# Patient Record
Sex: Female | Born: 1950 | Race: Black or African American | Hispanic: No | Smoking: Current every day smoker
Health system: Southern US, Community
[De-identification: ages and names within clinical notes are randomized; demographics above are authoritative.]

## PROBLEM LIST (undated history)

## (undated) DIAGNOSIS — R5383 Other fatigue: Secondary | ICD-10-CM

## (undated) DIAGNOSIS — R0683 Snoring: Secondary | ICD-10-CM

## (undated) DIAGNOSIS — E785 Hyperlipidemia, unspecified: Secondary | ICD-10-CM

## (undated) DIAGNOSIS — K219 Gastro-esophageal reflux disease without esophagitis: Secondary | ICD-10-CM

## (undated) DIAGNOSIS — I10 Essential (primary) hypertension: Secondary | ICD-10-CM

## (undated) DIAGNOSIS — G4700 Insomnia, unspecified: Secondary | ICD-10-CM

## (undated) DIAGNOSIS — F419 Anxiety disorder, unspecified: Secondary | ICD-10-CM

## (undated) DIAGNOSIS — F329 Major depressive disorder, single episode, unspecified: Secondary | ICD-10-CM

## (undated) DIAGNOSIS — F17200 Nicotine dependence, unspecified, uncomplicated: Secondary | ICD-10-CM

## (undated) DIAGNOSIS — E7800 Pure hypercholesterolemia, unspecified: Secondary | ICD-10-CM

## (undated) HISTORY — DX: Gastro-esophageal reflux disease without esophagitis: K21.9

## (undated) HISTORY — PX: TUBAL LIGATION: SHX77

## (undated) HISTORY — DX: Anxiety disorder, unspecified: F41.9

## (undated) HISTORY — DX: Essential (primary) hypertension: I10

## (undated) HISTORY — DX: Hyperlipidemia, unspecified: E78.5

## (undated) HISTORY — DX: Major depressive disorder, single episode, unspecified: F32.9

## (undated) HISTORY — DX: Other fatigue: R53.83

## (undated) HISTORY — DX: Snoring: R06.83

## (undated) HISTORY — DX: Nicotine dependence, unspecified, uncomplicated: F17.200

## (undated) HISTORY — DX: Insomnia, unspecified: G47.00

---

## 2012-11-17 ENCOUNTER — Encounter (HOSPITAL_COMMUNITY): Payer: Self-pay | Admitting: *Deleted

## 2012-11-17 ENCOUNTER — Emergency Department (HOSPITAL_COMMUNITY): Payer: Self-pay

## 2012-11-17 ENCOUNTER — Emergency Department (HOSPITAL_COMMUNITY)
Admission: EM | Admit: 2012-11-17 | Discharge: 2012-11-17 | Disposition: A | Discharge status: Home / Self Care | Payer: Self-pay | Attending: Emergency Medicine | Admitting: Emergency Medicine

## 2012-11-17 DIAGNOSIS — F17200 Nicotine dependence, unspecified, uncomplicated: Secondary | ICD-10-CM | POA: Insufficient documentation

## 2012-11-17 DIAGNOSIS — I10 Essential (primary) hypertension: Secondary | ICD-10-CM | POA: Insufficient documentation

## 2012-11-17 DIAGNOSIS — R5381 Other malaise: Secondary | ICD-10-CM | POA: Insufficient documentation

## 2012-11-17 DIAGNOSIS — R509 Fever, unspecified: Secondary | ICD-10-CM | POA: Insufficient documentation

## 2012-11-17 DIAGNOSIS — R1012 Left upper quadrant pain: Secondary | ICD-10-CM | POA: Insufficient documentation

## 2012-11-17 DIAGNOSIS — J189 Pneumonia, unspecified organism: Secondary | ICD-10-CM | POA: Insufficient documentation

## 2012-11-17 DIAGNOSIS — N390 Urinary tract infection, site not specified: Secondary | ICD-10-CM | POA: Insufficient documentation

## 2012-11-17 HISTORY — DX: Essential (primary) hypertension: I10

## 2012-11-17 LAB — CBC WITH DIFFERENTIAL/PLATELET
Basophils Absolute: 0.1 10*3/uL (ref 0.0–0.1)
Basophils Relative: 1 % (ref 0–1)
Eosinophils Absolute: 0.1 10*3/uL (ref 0.0–0.7)
Eosinophils Relative: 1 % (ref 0–5)
MCH: 30.3 pg (ref 26.0–34.0)
MCV: 86.9 fL (ref 78.0–100.0)
Platelets: 268 10*3/uL (ref 150–400)
RDW: 13.3 % (ref 11.5–15.5)
WBC: 8.5 10*3/uL (ref 4.0–10.5)

## 2012-11-17 LAB — URINALYSIS, ROUTINE W REFLEX MICROSCOPIC
Bilirubin Urine: NEGATIVE
Ketones, ur: NEGATIVE mg/dL
Nitrite: POSITIVE — AB
Urobilinogen, UA: 1 mg/dL (ref 0.0–1.0)

## 2012-11-17 LAB — BASIC METABOLIC PANEL
CO2: 27 mEq/L (ref 19–32)
Calcium: 9.4 mg/dL (ref 8.4–10.5)
Creatinine, Ser: 0.64 mg/dL (ref 0.50–1.10)
Glucose, Bld: 107 mg/dL — ABNORMAL HIGH (ref 70–99)

## 2012-11-17 MED ORDER — SODIUM CHLORIDE 0.9 % IV BOLUS (SEPSIS)
500.0000 mL | Freq: Once | INTRAVENOUS | Status: AC
  Administered 2012-11-17: 500 mL via INTRAVENOUS

## 2012-11-17 MED ORDER — POTASSIUM CHLORIDE CRYS ER 20 MEQ PO TBCR
40.0000 meq | EXTENDED_RELEASE_TABLET | Freq: Once | ORAL | Status: AC
  Administered 2012-11-17: 40 meq via ORAL
  Filled 2012-11-17: qty 2

## 2012-11-17 MED ORDER — SODIUM CHLORIDE 0.9 % IV SOLN: Freq: Once | INTRAVENOUS | Status: DC

## 2012-11-17 MED ORDER — OXYCODONE-ACETAMINOPHEN 5-325 MG PO TABS
1.0000 | ORAL_TABLET | Freq: Once | ORAL | Status: AC
  Administered 2012-11-17: 1 via ORAL
  Filled 2012-11-17: qty 1

## 2012-11-17 MED ORDER — AZITHROMYCIN 250 MG PO TABS: ORAL_TABLET | ORAL | Status: DC

## 2012-11-17 MED ORDER — AZITHROMYCIN 250 MG PO TABS
500.0000 mg | ORAL_TABLET | Freq: Once | ORAL | Status: AC
  Administered 2012-11-17: 500 mg via ORAL
  Filled 2012-11-17: qty 2

## 2012-11-17 MED ORDER — CEPHALEXIN 500 MG PO CAPS: 500.0000 mg | ORAL_CAPSULE | Freq: Three times a day (TID) | ORAL | Status: DC

## 2012-11-17 MED ORDER — OXYCODONE-ACETAMINOPHEN 5-325 MG PO TABS: 1.0000 | ORAL_TABLET | ORAL | Status: DC | PRN

## 2012-11-17 MED ORDER — CEFTRIAXONE SODIUM 1 G IJ SOLR
1.0000 g | Freq: Once | INTRAMUSCULAR | Status: AC
  Administered 2012-11-17: 1 g via INTRAVENOUS
  Filled 2012-11-17: qty 10

## 2012-11-17 NOTE — ED Notes (Signed)
Dr. Bebe Shaggy notified of K+ 2.9.

## 2012-11-17 NOTE — ED Notes (Signed)
Pt states she has been productive cough x ~ 1 week, yellow in color. LUQ/rib area pain x 3 days which pt states is much worse with coughing. Pt states she has chills and had V/D last week.

## 2012-11-17 NOTE — ED Notes (Signed)
Patient has had cough for greater than 1 week w/development of L lower rib cage pain w/coughing.  N/V/D last week, but none over weekend.  According to daughter, patient has lost great deal of weight since death of mother in Apr 10, 2011.  Patient states she has no appetite and get full very quickly.  She has not been taken fluids well over the last week. She does express having some depression since 10-Apr-2011.

## 2012-11-17 NOTE — ED Provider Notes (Signed)
History  This chart was scribed for Joya Gaskins, MD by Ardeen Jourdain, ED Scribe. This patient was seen in room [facility]12/[facility]12 and the patient's care was started at 1114.  CSN: [id number]  Arrival date & time 11/17/12  1023   First MD Initiated Contact with Patient 11/17/12 1114      Chief Complaint  Patient presents with  . Cough  . Abdominal Pain     Patient is a 62 y.o. female presenting with cough and abdominal pain. The history is provided by the patient. No language interpreter was used.  Cough This is a new problem. The current episode started more than 2 days ago. The problem has been gradually worsening. The cough is productive of sputum. There has been no fever.  Abdominal Pain The primary symptoms of the illness include abdominal pain, fever and fatigue. The primary symptoms of the illness do not include nausea, vomiting or diarrhea. The current episode started more than 2 days ago.  The abdominal pain began more than 2 days ago. The pain came on gradually. The abdominal pain has been unchanged since its onset. The abdominal pain is located in the LUQ. The abdominal pain does not radiate. The abdominal pain is exacerbated by coughing.    Tamara Santiago is a 62 y.o. female who presents to the Emergency Department complaining of cough with associated fever, loss of appetite, HA, fatigue and LUQ/rib pain. She states she had emesis and diarrhea 1 week ago but that it has resolved. She states she is a smoker but has not been able to smoke due to her cough. She states she has lost 15 pounds in the past 2 weeks.    Past Medical History  Diagnosis Date  . Hypertension     Past Surgical History  Procedure Date  . Tubal ligation     No family history on file.  History  Substance Use Topics  . Smoking status: Current Every Day Smoker  . Smokeless tobacco: Not on file  . Alcohol Use: No   No OB history available.   Review of Systems  Constitutional: Positive for  fever, fatigue and unexpected weight change.  Respiratory: Positive for cough.   Gastrointestinal: Positive for abdominal pain. Negative for nausea, vomiting and diarrhea.  All other systems reviewed and are negative.    Allergies  Review of patient's allergies indicates no known allergies.  Home Medications  No current outpatient prescriptions on file.  Triage Vitals: BP 138/87  Pulse 85  Temp 98.1 F (36.7 C) (Oral)  Resp 20  Ht 5\' 5"  (1.651 m)  Wt 122 lb 3 oz (55.424 kg)  BMI 20.33 kg/m2  SpO2 100% BP 134/58  Pulse 61  Temp 98.7 F (37.1 C) (Oral)  Resp 18  Ht 5\' 5"  (1.651 m)  Wt 122 lb 3 oz (55.424 kg)  BMI 20.33 kg/m2  SpO2 98%  Physical Exam  CONSTITUTIONAL: Well developed/well nourished HEAD AND FACE: Normocephalic/atraumatic EYES: EOMI/PERRL ENMT: Mucous membranes moist NECK: supple no meningeal signs SPINE:entire spine nontender CV: S1/S2 noted, no murmurs/rubs/gallops noted Chest wall: Tenderness to left lower ribs, no crepitance  LUNGS: Crackles to left base, no apparent distress ABDOMEN: soft, nontender, no rebound or guarding GU:no cva tenderness NEURO: Pt is awake/alert, moves all extremitiesx4 EXTREMITIES: pulses normal, full ROM SKIN: warm, color normal PSYCH: no abnormalities of mood noted   ED Course  Procedures   DIAGNOSTIC STUDIES: Oxygen Saturation is 100% on room air, normal  by my interpretation.  COORDINATION OF CARE:  11:45 AM: Discussed treatment plan which includes CXR, IV fluids, UA and CBC with pt at bedside and pt agreed to plan.   Pt improved.  Ambulatory and vitals appropriate.  She is now living in this area and referred to local PCP.  She has ?pneumonia and has uti.  She will be treated for both.  She does not appear septic.  Advised to f/u for unintentional wt loss.  Pt agreeable.  She will stop smoking.      MDM  Nursing notes including past medical history and social history reviewed and considered in  documentation xrays reviewed and considered Labs/vital reviewed and considered      Date: 11/17/2012  Rate: 64  Rhythm: normal sinus rhythm  QRS Axis: normal  Intervals: normal  ST/T Wave abnormalities: normal  Conduction Disutrbances:none  Narrative Interpretation:   Old EKG Reviewed: none available at time of interpretation    I personally performed the services described in this documentation, which was scribed in my presence. The recorded information has been reviewed and is accurate.      Joya Gaskins, MD 11/17/12 [phone]

## 2012-11-17 NOTE — ED Notes (Signed)
Patient with no complaints at this time. Respirations even and unlabored. Skin warm/dry. Discharge instructions reviewed with patient at this time. Patient given opportunity to voice concerns/ask questions. IV removed per policy and band-aid applied to site. Patient discharged at this time and left Emergency Department with steady gait.  

## 2012-11-17 NOTE — ED Notes (Signed)
EKG delay due to patient in x-ray.

## 2012-11-17 NOTE — ED Notes (Signed)
Patient stated that she "felt shaky" while walking and also that she was very weak.  Patient did ambulate without assistance

## 2012-11-19 LAB — URINE CULTURE: Colony Count: >=100000

## 2012-11-20 NOTE — ED Notes (Signed)
+   Urine Patient treated with keflex-sensitive to same-chart appended per protocol MD. 

## 2016-10-02 ENCOUNTER — Other Ambulatory Visit (HOSPITAL_COMMUNITY): Payer: Self-pay | Admitting: Internal Medicine

## 2016-10-02 DIAGNOSIS — Z1231 Encounter for screening mammogram for malignant neoplasm of breast: Secondary | ICD-10-CM

## 2016-10-10 ENCOUNTER — Other Ambulatory Visit (HOSPITAL_COMMUNITY): Payer: Self-pay | Admitting: Internal Medicine

## 2016-10-10 DIAGNOSIS — E559 Vitamin D deficiency, unspecified: Secondary | ICD-10-CM

## 2016-10-10 DIAGNOSIS — Z780 Asymptomatic menopausal state: Secondary | ICD-10-CM

## 2016-10-15 ENCOUNTER — Other Ambulatory Visit (HOSPITAL_COMMUNITY): Payer: Self-pay

## 2016-10-15 ENCOUNTER — Ambulatory Visit (HOSPITAL_COMMUNITY): Payer: Self-pay

## 2016-10-22 ENCOUNTER — Other Ambulatory Visit (HOSPITAL_COMMUNITY): Payer: Self-pay

## 2016-10-24 ENCOUNTER — Ambulatory Visit (HOSPITAL_COMMUNITY)
Admission: RE | Admit: 2016-10-24 | Discharge: 2016-10-24 | Disposition: A | Discharge status: Home / Self Care | Payer: Medicare Other | Source: Ambulatory Visit | Attending: Internal Medicine | Admitting: Internal Medicine

## 2016-10-24 DIAGNOSIS — Z1231 Encounter for screening mammogram for malignant neoplasm of breast: Secondary | ICD-10-CM | POA: Diagnosis present

## 2016-10-24 DIAGNOSIS — M85832 Other specified disorders of bone density and structure, left forearm: Secondary | ICD-10-CM | POA: Diagnosis not present

## 2016-10-24 DIAGNOSIS — Z780 Asymptomatic menopausal state: Secondary | ICD-10-CM

## 2016-10-24 DIAGNOSIS — E559 Vitamin D deficiency, unspecified: Secondary | ICD-10-CM | POA: Diagnosis not present

## 2016-10-24 DIAGNOSIS — R928 Other abnormal and inconclusive findings on diagnostic imaging of breast: Secondary | ICD-10-CM | POA: Diagnosis not present

## 2016-10-24 DIAGNOSIS — M85852 Other specified disorders of bone density and structure, left thigh: Secondary | ICD-10-CM | POA: Diagnosis not present

## 2016-10-30 ENCOUNTER — Other Ambulatory Visit: Payer: Self-pay | Admitting: Internal Medicine

## 2016-10-30 DIAGNOSIS — R928 Other abnormal and inconclusive findings on diagnostic imaging of breast: Secondary | ICD-10-CM

## 2016-10-31 ENCOUNTER — Institutional Professional Consult (permissible substitution): Payer: Medicare Other | Admitting: Neurology

## 2016-10-31 ENCOUNTER — Telehealth: Payer: Self-pay

## 2016-10-31 NOTE — Telephone Encounter (Signed)
Pt did not show for their appt with Dr. Dohmeier today.  

## 2016-11-01 ENCOUNTER — Encounter: Payer: Self-pay | Admitting: Neurology

## 2016-11-02 ENCOUNTER — Other Ambulatory Visit (HOSPITAL_COMMUNITY): Payer: Self-pay | Admitting: Internal Medicine

## 2016-11-02 DIAGNOSIS — R928 Other abnormal and inconclusive findings on diagnostic imaging of breast: Secondary | ICD-10-CM

## 2016-11-06 ENCOUNTER — Ambulatory Visit (HOSPITAL_COMMUNITY)
Admission: RE | Admit: 2016-11-06 | Discharge: 2016-11-06 | Disposition: A | Discharge status: Home / Self Care | Payer: Medicare Other | Source: Ambulatory Visit | Attending: Internal Medicine | Admitting: Internal Medicine

## 2016-11-06 ENCOUNTER — Other Ambulatory Visit (HOSPITAL_COMMUNITY): Payer: Self-pay | Admitting: Internal Medicine

## 2016-11-06 DIAGNOSIS — R928 Other abnormal and inconclusive findings on diagnostic imaging of breast: Secondary | ICD-10-CM

## 2016-11-06 DIAGNOSIS — N6310 Unspecified lump in the right breast, unspecified quadrant: Secondary | ICD-10-CM | POA: Insufficient documentation

## 2016-11-06 DIAGNOSIS — N6320 Unspecified lump in the left breast, unspecified quadrant: Secondary | ICD-10-CM | POA: Insufficient documentation

## 2016-11-07 ENCOUNTER — Other Ambulatory Visit (HOSPITAL_COMMUNITY): Payer: Self-pay | Admitting: Internal Medicine

## 2016-11-07 DIAGNOSIS — N6310 Unspecified lump in the right breast, unspecified quadrant: Secondary | ICD-10-CM

## 2016-11-07 DIAGNOSIS — N6320 Unspecified lump in the left breast, unspecified quadrant: Secondary | ICD-10-CM

## 2016-11-19 ENCOUNTER — Encounter: Payer: Self-pay | Admitting: Adult Health

## 2016-11-20 ENCOUNTER — Inpatient Hospital Stay (HOSPITAL_COMMUNITY): Admission: RE | Admit: 2016-11-20 | Payer: Medicare Other | Source: Ambulatory Visit

## 2016-11-27 ENCOUNTER — Telehealth: Payer: Self-pay

## 2016-11-27 ENCOUNTER — Institutional Professional Consult (permissible substitution): Payer: Medicare Other | Admitting: Neurology

## 2016-11-27 NOTE — Telephone Encounter (Signed)
Please follow office protocol for no show x 2, okay to dismiss.

## 2016-11-27 NOTE — Telephone Encounter (Signed)
Patient no showed her 2nd new sleep consult appt. Ok to dismiss per office no show protocol?

## 2016-11-28 ENCOUNTER — Encounter: Payer: Self-pay | Admitting: Neurology

## 2017-01-17 ENCOUNTER — Encounter: Payer: Self-pay | Admitting: Obstetrics & Gynecology

## 2017-05-23 ENCOUNTER — Encounter (HOSPITAL_COMMUNITY): Payer: Self-pay | Admitting: *Deleted

## 2017-05-23 ENCOUNTER — Emergency Department (HOSPITAL_COMMUNITY)
Admission: EM | Admit: 2017-05-23 | Discharge: 2017-05-23 | Disposition: A | Discharge status: Home / Self Care | Payer: Medicare Other | Attending: Emergency Medicine | Admitting: Emergency Medicine

## 2017-05-23 DIAGNOSIS — R197 Diarrhea, unspecified: Secondary | ICD-10-CM | POA: Insufficient documentation

## 2017-05-23 DIAGNOSIS — F17200 Nicotine dependence, unspecified, uncomplicated: Secondary | ICD-10-CM | POA: Diagnosis not present

## 2017-05-23 DIAGNOSIS — E876 Hypokalemia: Secondary | ICD-10-CM | POA: Insufficient documentation

## 2017-05-23 DIAGNOSIS — R112 Nausea with vomiting, unspecified: Secondary | ICD-10-CM | POA: Insufficient documentation

## 2017-05-23 DIAGNOSIS — I10 Essential (primary) hypertension: Secondary | ICD-10-CM | POA: Diagnosis not present

## 2017-05-23 HISTORY — DX: Essential (primary) hypertension: I10

## 2017-05-23 LAB — URINALYSIS, ROUTINE W REFLEX MICROSCOPIC
Bilirubin Urine: NEGATIVE
Glucose, UA: NEGATIVE mg/dL
Ketones, ur: NEGATIVE mg/dL
Nitrite: NEGATIVE
Protein, ur: 30 mg/dL — AB
Specific Gravity, Urine: 1.013 (ref 1.005–1.030)
pH: 5 (ref 5.0–8.0)

## 2017-05-23 LAB — CBC WITH DIFFERENTIAL/PLATELET
Basophils Absolute: 0 10*3/uL (ref 0.0–0.1)
Basophils Relative: 0 %
Eosinophils Absolute: 0.2 10*3/uL (ref 0.0–0.7)
Eosinophils Relative: 2 %
HCT: 44.6 % (ref 36.0–46.0)
Hemoglobin: 15 g/dL (ref 12.0–15.0)
Lymphocytes Relative: 33 %
Lymphs Abs: 3.7 10*3/uL (ref 0.7–4.0)
MCH: 30.4 pg (ref 26.0–34.0)
MCHC: 33.6 g/dL (ref 30.0–36.0)
MCV: 90.3 fL (ref 78.0–100.0)
Monocytes Absolute: 0.9 10*3/uL (ref 0.1–1.0)
Monocytes Relative: 8 %
Neutro Abs: 6.5 10*3/uL (ref 1.7–7.7)
Neutrophils Relative %: 57 %
Platelets: 393 10*3/uL (ref 150–400)
RBC: 4.94 MIL/uL (ref 3.87–5.11)
RDW: 13.8 % (ref 11.5–15.5)
WBC: 11.3 10*3/uL — ABNORMAL HIGH (ref 4.0–10.5)

## 2017-05-23 LAB — BASIC METABOLIC PANEL
Anion gap: 11 (ref 5–15)
BUN: 22 mg/dL — ABNORMAL HIGH (ref 6–20)
CO2: 31 mmol/L (ref 22–32)
Calcium: 9.6 mg/dL (ref 8.9–10.3)
Chloride: 95 mmol/L — ABNORMAL LOW (ref 101–111)
Creatinine, Ser: 1.67 mg/dL — ABNORMAL HIGH (ref 0.44–1.00)
GFR calc Af Amer: 36 mL/min — ABNORMAL LOW (ref >60)
GFR calc non Af Amer: 31 mL/min — ABNORMAL LOW (ref >60)
Glucose, Bld: 125 mg/dL — ABNORMAL HIGH (ref 65–99)
Potassium: 3 mmol/L — ABNORMAL LOW (ref 3.5–5.1)
Sodium: 137 mmol/L (ref 135–145)

## 2017-05-23 MED ORDER — ONDANSETRON 4 MG PO TBDP: 4.0000 mg | ORAL_TABLET | Freq: Three times a day (TID) | ORAL | 0 refills | Status: DC | PRN

## 2017-05-23 MED ORDER — POTASSIUM CHLORIDE CRYS ER 20 MEQ PO TBCR: 40.0000 meq | EXTENDED_RELEASE_TABLET | Freq: Every day | ORAL | 0 refills | Status: DC

## 2017-05-23 MED ORDER — ONDANSETRON HCL 4 MG/2ML IJ SOLN
4.0000 mg | Freq: Once | INTRAMUSCULAR | Status: AC
  Administered 2017-05-23: 4 mg via INTRAVENOUS
  Filled 2017-05-23: qty 2

## 2017-05-23 MED ORDER — SODIUM CHLORIDE 0.9 % IV BOLUS (SEPSIS)
1000.0000 mL | Freq: Once | INTRAVENOUS | Status: AC
  Administered 2017-05-23: 1000 mL via INTRAVENOUS

## 2017-05-23 MED ORDER — LOPERAMIDE HCL 2 MG PO CAPS: 2.0000 mg | ORAL_CAPSULE | Freq: Four times a day (QID) | ORAL | 0 refills | Status: DC | PRN

## 2017-05-23 MED ORDER — POTASSIUM CHLORIDE CRYS ER 20 MEQ PO TBCR
40.0000 meq | EXTENDED_RELEASE_TABLET | Freq: Once | ORAL | Status: AC
  Administered 2017-05-23: 40 meq via ORAL
  Filled 2017-05-23: qty 2

## 2017-05-23 NOTE — ED Provider Notes (Signed)
Emergency Department Provider Note   I have reviewed the triage vital signs and the nursing notes.   HISTORY  Chief Complaint Emesis   HPI Tamara Santiago is a 66 y.o. female with PMH of HTN presents emergency permit for evaluation of diarrhea over the past 2 weeks. The patient reports severe diarrhea after eating. She has some abdominal discomfort after eating that is diffuse but none currently. Denies dysuria, hesitancy, urgency. No hematuria. She denies any chest pain or difficulty breathing. No prior history of chronic diarrhea. No recent antibiotics. No sick contacts. No radiation of symptoms. No modifying factors. She has not tried any over-the-counter antidiarrheal medications.   Past Medical History:  Diagnosis Date  . Hypertension     There are no active problems to display for this patient.   Past Surgical History:  Procedure Laterality Date  . TUBAL LIGATION      Current Outpatient Rx  . Order #: [id number] Class: Historical Med  . Order #: [id number] Class: Historical Med  . Order #: [id number] Class: Historical Med  . Order #: [id number] Class: Historical Med  . Order #: [id number] Class: Historical Med  . Order #: [id number] Class: Historical Med  . Order #: [id number] Class: Historical Med  . Order #: [id number] Class: Historical Med  . Order #: [id number] Class: Print  . Order #: [id number] Class: Print  . Order #: [id number] Class: Print    Allergies Patient has no known allergies.  No family history on file.  Social History Social History  Substance Use Topics  . Smoking status: Current Some Day Smoker  . Smokeless tobacco: Never Used  . Alcohol use No    Review of Systems  Constitutional: No fever/chills Eyes: No visual changes. ENT: No sore throat. Cardiovascular: Denies chest pain. Respiratory: Denies shortness of breath. Gastrointestinal: No abdominal pain.  No nausea, no vomiting. Positive diarrhea.  No constipation. Genitourinary: Negative for  dysuria. Musculoskeletal: Negative for back pain. Positive leg cramping.  Skin: Negative for rash. Neurological: Negative for headaches, focal weakness or numbness.  10-point ROS otherwise negative.  ____________________________________________   PHYSICAL EXAM:  VITAL SIGNS: ED Triage Vitals [05/23/17 1535]  Enc Vitals Group     BP 107/79     Pulse Rate 95     Resp 17     Temp 97.7 F (36.5 C)     Temp Source Oral     SpO2 96 %     Weight 171 lb (77.6 kg)     Height 5\' 4"  (1.626 m)   Constitutional: Alert and oriented. Well appearing and in no acute distress. Eyes: Conjunctivae are normal. Head: Atraumatic. Nose: No congestion/rhinnorhea. Mouth/Throat: Mucous membranes are slightly dry.  Neck: No stridor.   Cardiovascular: Normal rate, regular rhythm. Good peripheral circulation. Grossly normal heart sounds.   Respiratory: Normal respiratory effort.  No retractions. Lungs CTAB. Gastrointestinal: Soft and nontender. No distention.  Musculoskeletal: No lower extremity tenderness nor edema. No gross deformities of extremities. Neurologic:  Normal speech and language. No gross focal neurologic deficits are appreciated.  Skin:  Skin is warm, dry and intact. No rash noted.  ____________________________________________   LABS (all labs ordered are listed, but only abnormal results are displayed)  Labs Reviewed  CBC WITH DIFFERENTIAL/PLATELET - Abnormal; Notable for the following:       Result Value   WBC 11.3 (*)    All other components within normal limits  BASIC METABOLIC PANEL - Abnormal; Notable for the following:    Potassium 3.0 (*)    Chloride 95 (*)  Glucose, Bld 125 (*)    BUN 22 (*)    Creatinine, Ser 1.67 (*)    GFR calc non Af Amer 31 (*)    GFR calc Af Amer 36 (*)    All other components within normal limits  URINALYSIS, ROUTINE W REFLEX MICROSCOPIC - Abnormal; Notable for the following:    Color, Urine AMBER (*)    APPearance CLOUDY (*)    Hgb  urine dipstick SMALL (*)    Protein, ur 30 (*)    Leukocytes, UA TRACE (*)    Bacteria, UA RARE (*)    Squamous Epithelial / LPF 0-5 (*)    All other components within normal limits   ____________________________________________  EKG   EKG Interpretation  Date/Time:  Thursday May 23 2017 18:04:08 EDT Ventricular Rate:  54 PR Interval:    QRS Duration: 143 QT Interval:  495 QTC Calculation: 470 R Axis:   95 Text Interpretation:  Sinus rhythm Multiple premature complexes, vent & supraven Right bundle branch block No STEMI. No old for comparison.  Confirmed by Alona BeneLong, Joshua [phone]) on 05/23/2017 6:43:29 PM       ____________________________________________  RADIOLOGY  None ____________________________________________   PROCEDURES  Procedure(s) performed:   Procedures  None ____________________________________________   INITIAL IMPRESSION / ASSESSMENT AND PLAN / ED COURSE  Pertinent labs & imaging results that were available during my care of the patient were reviewed by me and considered in my medical decision making (see chart for details).  Patient presents to the emergency department for evaluation of diarrhea after eating for the past 2 weeks. Her lab work shows some mild dehydration with associated hypokalemia. On review of systems she is complaining of cramping in her legs which is likely due to her electrolyte abnormalities and dehydration. Plan for IV fluids here, by mouth challenge, potassium supplementation. She has an appointment with her primary care physician in the next week. We'll obtain EKG to assess any rhythm abnormality from hypokalemia. Patient with no focal tenderness on exam to suggest acute intra-abdominal pathology.  07:53 PM Patient is tolerating fluids. Plan for potassium supplementation at home with PCP follow up to re-check potassium. Plan for Zofran and Immodium as needed for symptoms mgmt. If symptoms continue patient to follow with PCP for  stool cultures. No diarrhea here in the ED.   At this time, I do not feel there is any life-threatening condition present. I have reviewed and discussed all results (EKG, imaging, lab, urine as appropriate), exam findings with patient. I have reviewed nursing notes and appropriate previous records.  I feel the patient is safe to be discharged home without further emergent workup. Discussed usual and customary return precautions. Patient and family (if present) verbalize understanding and are comfortable with this plan.  Patient will follow-up with their primary care provider. If they do not have a primary care provider, information for follow-up has been provided to them. All questions have been answered.  ____________________________________________  FINAL CLINICAL IMPRESSION(S) / ED DIAGNOSES  Final diagnoses:  Nausea vomiting and diarrhea  Hypokalemia     MEDICATIONS GIVEN DURING THIS VISIT:  Medications  sodium chloride 0.9 % bolus 1,000 mL (0 mLs Intravenous Stopped 05/23/17 1941)  ondansetron (ZOFRAN) injection 4 mg (4 mg Intravenous Given 05/23/17 1803)  potassium chloride SA (K-DUR,KLOR-CON) CR tablet 40 mEq (40 mEq Oral Given 05/23/17 1803)     NEW OUTPATIENT MEDICATIONS STARTED DURING THIS VISIT:  Discharge Medication List as of 05/23/2017  7:58 PM  START taking these medications   Details  loperamide (IMODIUM) 2 MG capsule Take 1 capsule (2 mg total) by mouth 4 (four) times daily as needed for diarrhea or loose stools., Starting Thu 05/23/2017, Print    ondansetron (ZOFRAN ODT) 4 MG disintegrating tablet Take 1 tablet (4 mg total) by mouth every 8 (eight) hours as needed for nausea or vomiting., Starting Thu 05/23/2017, Print    potassium chloride SA (K-DUR,KLOR-CON) 20 MEQ tablet Take 2 tablets (40 mEq total) by mouth daily., Starting Thu 05/23/2017, Until Mon 05/27/2017, Print          Note:  This document was prepared using Dragon voice recognition software and may  include unintentional dictation errors.  Alona Bene, MD Emergency Medicine    Long, Arlyss Repress, MD 05/23/17 2142

## 2017-05-23 NOTE — Discharge Instructions (Signed)
You have been seen in the Emergency Department (ED) today for nausea and vomiting.  Your work up today has not shown a clear cause for your symptoms. You have been prescribed Zofran; please use as prescribed as needed for your nausea.  Take the Immodium as needed for diarrhea. I am also giving a potassium supplement for your low potassium. Contact your PCP for follow up and repeat potassium labs next week to ensure it is improving.   Follow up with your doctor as soon as possible regarding today?s emergent visit and your symptoms of nausea.   Return to the ED if you develop abdominal, bloody vomiting, bloody diarrhea, if you are unable to tolerate fluids due to vomiting, or if you develop other symptoms that concern you.

## 2017-05-23 NOTE — ED Triage Notes (Signed)
Nausea, vomiting and diarrhea 

## 2017-11-18 ENCOUNTER — Other Ambulatory Visit (HOSPITAL_COMMUNITY): Payer: Self-pay | Admitting: Family Medicine

## 2017-11-18 DIAGNOSIS — Z13820 Encounter for screening for osteoporosis: Secondary | ICD-10-CM

## 2018-09-23 ENCOUNTER — Inpatient Hospital Stay (HOSPITAL_COMMUNITY)
Admission: EM | Admit: 2018-09-23 | Discharge: 2018-09-29 | DRG: 417 | Disposition: A | Discharge status: Home Health | Payer: Medicare Other | Attending: General Surgery | Admitting: General Surgery

## 2018-09-23 ENCOUNTER — Inpatient Hospital Stay (HOSPITAL_COMMUNITY): Payer: Medicare Other

## 2018-09-23 ENCOUNTER — Emergency Department (HOSPITAL_COMMUNITY): Payer: Medicare Other

## 2018-09-23 ENCOUNTER — Other Ambulatory Visit: Payer: Self-pay

## 2018-09-23 ENCOUNTER — Encounter (HOSPITAL_COMMUNITY): Payer: Self-pay | Admitting: Emergency Medicine

## 2018-09-23 DIAGNOSIS — E876 Hypokalemia: Secondary | ICD-10-CM | POA: Diagnosis present

## 2018-09-23 DIAGNOSIS — A419 Sepsis, unspecified organism: Secondary | ICD-10-CM | POA: Diagnosis present

## 2018-09-23 DIAGNOSIS — Z23 Encounter for immunization: Secondary | ICD-10-CM

## 2018-09-23 DIAGNOSIS — E7800 Pure hypercholesterolemia, unspecified: Secondary | ICD-10-CM | POA: Diagnosis present

## 2018-09-23 DIAGNOSIS — Z79899 Other long term (current) drug therapy: Secondary | ICD-10-CM

## 2018-09-23 DIAGNOSIS — T502X5A Adverse effect of carbonic-anhydrase inhibitors, benzothiadiazides and other diuretics, initial encounter: Secondary | ICD-10-CM | POA: Diagnosis present

## 2018-09-23 DIAGNOSIS — R000 Tachycardia, unspecified: Secondary | ICD-10-CM | POA: Diagnosis present

## 2018-09-23 DIAGNOSIS — R52 Pain, unspecified: Secondary | ICD-10-CM

## 2018-09-23 DIAGNOSIS — F17200 Nicotine dependence, unspecified, uncomplicated: Secondary | ICD-10-CM | POA: Diagnosis present

## 2018-09-23 DIAGNOSIS — K219 Gastro-esophageal reflux disease without esophagitis: Secondary | ICD-10-CM | POA: Diagnosis present

## 2018-09-23 DIAGNOSIS — R1010 Upper abdominal pain, unspecified: Secondary | ICD-10-CM

## 2018-09-23 DIAGNOSIS — I10 Essential (primary) hypertension: Secondary | ICD-10-CM | POA: Diagnosis present

## 2018-09-23 DIAGNOSIS — R079 Chest pain, unspecified: Secondary | ICD-10-CM

## 2018-09-23 DIAGNOSIS — K8309 Other cholangitis: Secondary | ICD-10-CM | POA: Diagnosis present

## 2018-09-23 DIAGNOSIS — K8000 Calculus of gallbladder with acute cholecystitis without obstruction: Secondary | ICD-10-CM | POA: Diagnosis present

## 2018-09-23 DIAGNOSIS — K810 Acute cholecystitis: Secondary | ICD-10-CM | POA: Diagnosis not present

## 2018-09-23 HISTORY — DX: Pure hypercholesterolemia, unspecified: E78.00

## 2018-09-23 HISTORY — DX: Gastro-esophageal reflux disease without esophagitis: K21.9

## 2018-09-23 LAB — URINALYSIS, ROUTINE W REFLEX MICROSCOPIC
Bilirubin Urine: NEGATIVE
Glucose, UA: NEGATIVE mg/dL
Ketones, ur: NEGATIVE mg/dL
Leukocytes, UA: NEGATIVE
Nitrite: NEGATIVE
Protein, ur: NEGATIVE mg/dL
Specific Gravity, Urine: 1.019 (ref 1.005–1.030)
pH: 5 (ref 5.0–8.0)

## 2018-09-23 LAB — BASIC METABOLIC PANEL
Anion gap: 12 (ref 5–15)
BUN: 13 mg/dL (ref 8–23)
CO2: 24 mmol/L (ref 22–32)
Calcium: 8.8 mg/dL — ABNORMAL LOW (ref 8.9–10.3)
Chloride: 95 mmol/L — ABNORMAL LOW (ref 98–111)
Creatinine, Ser: 0.98 mg/dL (ref 0.44–1.00)
GFR calc Af Amer: >60 mL/min (ref >60)
GFR calc non Af Amer: 58 mL/min — ABNORMAL LOW (ref >60)
Glucose, Bld: 135 mg/dL — ABNORMAL HIGH (ref 70–99)
Potassium: 2.6 mmol/L — CL (ref 3.5–5.1)
Sodium: 131 mmol/L — ABNORMAL LOW (ref 135–145)

## 2018-09-23 LAB — DIFFERENTIAL
Basophils Absolute: 0 10*3/uL (ref 0.0–0.1)
Basophils Relative: 0 %
Eosinophils Absolute: 0.1 10*3/uL (ref 0.0–0.5)
Eosinophils Relative: 0 %
Lymphocytes Relative: 13 %
Lymphs Abs: 2.3 10*3/uL (ref 0.7–4.0)
Monocytes Absolute: 1.1 10*3/uL — ABNORMAL HIGH (ref 0.1–1.0)
Monocytes Relative: 6 %
Neutro Abs: 13.8 10*3/uL — ABNORMAL HIGH (ref 1.7–7.7)
Neutrophils Relative %: 79 %

## 2018-09-23 LAB — CBC
HCT: 39.2 % (ref 36.0–46.0)
Hemoglobin: 13.2 g/dL (ref 12.0–15.0)
MCH: 29.7 pg (ref 26.0–34.0)
MCHC: 33.7 g/dL (ref 30.0–36.0)
MCV: 88.3 fL (ref 80.0–100.0)
Platelets: 355 10*3/uL (ref 150–400)
RBC: 4.44 MIL/uL (ref 3.87–5.11)
RDW: 13.3 % (ref 11.5–15.5)
WBC: 17.4 10*3/uL — ABNORMAL HIGH (ref 4.0–10.5)
nRBC: 0 % (ref 0.0–0.2)

## 2018-09-23 LAB — HEPATIC FUNCTION PANEL
ALT: 12 U/L (ref 0–44)
AST: 15 U/L (ref 15–41)
Albumin: 3.2 g/dL — ABNORMAL LOW (ref 3.5–5.0)
Alkaline Phosphatase: 73 U/L (ref 38–126)
Bilirubin, Direct: 0.3 mg/dL — ABNORMAL HIGH (ref 0.0–0.2)
Indirect Bilirubin: 0.9 mg/dL (ref 0.3–0.9)
Total Bilirubin: 1.2 mg/dL (ref 0.3–1.2)
Total Protein: 8 g/dL (ref 6.5–8.1)

## 2018-09-23 LAB — APTT: aPTT: 32 seconds (ref 24–36)

## 2018-09-23 LAB — PROTIME-INR
INR: 1.25
Prothrombin Time: 15.6 seconds — ABNORMAL HIGH (ref 11.4–15.2)

## 2018-09-23 LAB — LIPASE, BLOOD: Lipase: 22 U/L (ref 11–51)

## 2018-09-23 LAB — LACTIC ACID, PLASMA
Lactic Acid, Venous: 1.6 mmol/L (ref 0.5–1.9)
Lactic Acid, Venous: 1.1 mmol/L (ref 0.5–1.9)

## 2018-09-23 LAB — PROCALCITONIN: Procalcitonin: 2.85 ng/mL

## 2018-09-23 LAB — MAGNESIUM: Magnesium: 1.8 mg/dL (ref 1.7–2.4)

## 2018-09-23 LAB — TROPONIN I: Troponin I: <0.03 ng/mL (ref <0.03)

## 2018-09-23 LAB — AMYLASE: Amylase: 33 U/L (ref 28–100)

## 2018-09-23 MED ORDER — MAGNESIUM SULFATE 2 GM/50ML IV SOLN
2.0000 g | Freq: Once | INTRAVENOUS | Status: AC
  Administered 2018-09-23: 2 g via INTRAVENOUS
  Filled 2018-09-23: qty 50

## 2018-09-23 MED ORDER — PIPERACILLIN-TAZOBACTAM 3.375 G IVPB: 3.3750 g | Freq: Three times a day (TID) | INTRAVENOUS | Status: DC

## 2018-09-23 MED ORDER — LACTATED RINGERS IV BOLUS (SEPSIS)
1000.0000 mL | Freq: Once | INTRAVENOUS | Status: AC
  Administered 2018-09-23: 1000 mL via INTRAVENOUS

## 2018-09-23 MED ORDER — LACTATED RINGERS IV BOLUS (SEPSIS)
500.0000 mL | Freq: Once | INTRAVENOUS | Status: AC
  Administered 2018-09-23: 500 mL via INTRAVENOUS

## 2018-09-23 MED ORDER — POTASSIUM CHLORIDE 10 MEQ/100ML IV SOLN
10.0000 meq | Freq: Once | INTRAVENOUS | Status: AC
  Administered 2018-09-23: 10 meq via INTRAVENOUS
  Filled 2018-09-23: qty 100

## 2018-09-23 MED ORDER — POTASSIUM CHLORIDE IN NACL 40-0.9 MEQ/L-% IV SOLN
INTRAVENOUS | Status: DC
  Administered 2018-09-23 – 2018-09-24 (×2): 100 mL/h via INTRAVENOUS

## 2018-09-23 MED ORDER — PIPERACILLIN-TAZOBACTAM 3.375 G IVPB
3.3750 g | Freq: Once | INTRAVENOUS | Status: AC
  Administered 2018-09-23: 3.375 g via INTRAVENOUS
  Filled 2018-09-23: qty 50

## 2018-09-23 MED ORDER — HYDROMORPHONE HCL 1 MG/ML IJ SOLN
0.5000 mg | Freq: Once | INTRAMUSCULAR | Status: AC
  Administered 2018-09-23: 0.5 mg via INTRAVENOUS
  Filled 2018-09-23: qty 1

## 2018-09-23 MED ORDER — HEPARIN SODIUM (PORCINE) 5000 UNIT/ML IJ SOLN
5000.0000 [IU] | Freq: Three times a day (TID) | INTRAMUSCULAR | Status: DC
  Administered 2018-09-23 – 2018-09-24 (×2): 5000 [IU] via SUBCUTANEOUS
  Filled 2018-09-23 (×2): qty 1

## 2018-09-23 MED ORDER — PIPERACILLIN-TAZOBACTAM 3.375 G IVPB 30 MIN
3.3750 g | Freq: Once | INTRAVENOUS | Status: DC
  Filled 2018-09-23: qty 50

## 2018-09-23 MED ORDER — ONDANSETRON HCL 4 MG PO TABS: 4.0000 mg | ORAL_TABLET | Freq: Four times a day (QID) | ORAL | Status: DC | PRN

## 2018-09-23 MED ORDER — PANTOPRAZOLE SODIUM 40 MG PO TBEC
40.0000 mg | DELAYED_RELEASE_TABLET | Freq: Every day | ORAL | Status: DC
  Administered 2018-09-23 – 2018-09-28 (×5): 40 mg via ORAL
  Filled 2018-09-23 (×4): qty 1

## 2018-09-23 MED ORDER — AMITRIPTYLINE HCL 25 MG PO TABS
100.0000 mg | ORAL_TABLET | Freq: Every day | ORAL | Status: DC
  Administered 2018-09-23 – 2018-09-27 (×5): 100 mg via ORAL
  Filled 2018-09-23 (×5): qty 4

## 2018-09-23 MED ORDER — POTASSIUM CHLORIDE 10 MEQ/100ML IV SOLN: 10.0000 meq | INTRAVENOUS | Status: DC

## 2018-09-23 MED ORDER — POTASSIUM CHLORIDE 10 MEQ/100ML IV SOLN
10.0000 meq | INTRAVENOUS | Status: AC
  Administered 2018-09-23 (×4): 10 meq via INTRAVENOUS
  Filled 2018-09-23: qty 100

## 2018-09-23 MED ORDER — HYDROMORPHONE HCL 1 MG/ML IJ SOLN
0.5000 mg | INTRAMUSCULAR | Status: DC | PRN
  Administered 2018-09-23 – 2018-09-24 (×3): 0.5 mg via INTRAVENOUS
  Filled 2018-09-23 (×3): qty 0.5

## 2018-09-23 MED ORDER — ACETAMINOPHEN 650 MG RE SUPP: 650.0000 mg | Freq: Four times a day (QID) | RECTAL | Status: DC | PRN

## 2018-09-23 MED ORDER — PIPERACILLIN-TAZOBACTAM 3.375 G IVPB
3.3750 g | Freq: Three times a day (TID) | INTRAVENOUS | Status: DC
  Administered 2018-09-24 – 2018-09-28 (×14): 3.375 g via INTRAVENOUS
  Filled 2018-09-23 (×13): qty 50

## 2018-09-23 MED ORDER — ASPIRIN EC 81 MG PO TBEC
81.0000 mg | DELAYED_RELEASE_TABLET | Freq: Every day | ORAL | Status: DC
  Administered 2018-09-23: 81 mg via ORAL
  Filled 2018-09-23: qty 1

## 2018-09-23 MED ORDER — MUPIROCIN 2 % EX OINT: 1.0000 "application " | TOPICAL_OINTMENT | Freq: Two times a day (BID) | CUTANEOUS | Status: DC

## 2018-09-23 MED ORDER — IOPAMIDOL (ISOVUE-300) INJECTION 61%
100.0000 mL | Freq: Once | INTRAVENOUS | Status: AC | PRN
  Administered 2018-09-23: 100 mL via INTRAVENOUS

## 2018-09-23 MED ORDER — PNEUMOCOCCAL VAC POLYVALENT 25 MCG/0.5ML IJ INJ
0.5000 mL | INJECTION | INTRAMUSCULAR | Status: AC
  Administered 2018-09-24: 0.5 mL via INTRAMUSCULAR
  Filled 2018-09-23: qty 0.5

## 2018-09-23 MED ORDER — ONDANSETRON HCL 4 MG/2ML IJ SOLN
4.0000 mg | Freq: Four times a day (QID) | INTRAMUSCULAR | Status: DC | PRN
  Administered 2018-09-24: 4 mg via INTRAVENOUS
  Filled 2018-09-23: qty 2

## 2018-09-23 MED ORDER — ACETAMINOPHEN 325 MG PO TABS
650.0000 mg | ORAL_TABLET | Freq: Four times a day (QID) | ORAL | Status: DC | PRN
  Administered 2018-09-24: 650 mg via ORAL
  Filled 2018-09-23: qty 2

## 2018-09-23 MED ORDER — INFLUENZA VAC SPLIT HIGH-DOSE 0.5 ML IM SUSY
0.5000 mL | PREFILLED_SYRINGE | INTRAMUSCULAR | Status: AC
  Administered 2018-09-24: 0.5 mL via INTRAMUSCULAR
  Filled 2018-09-23: qty 0.5

## 2018-09-23 NOTE — ED Notes (Signed)
Pt just recalled having pain (aching) to both legs last night.  Currently rates pain 5/10 but last night she rate pain 10/10.

## 2018-09-23 NOTE — ED Notes (Signed)
CRITICAL VALUE ALERT  Critical Value:  Potassium 2.6  Date & Time Notied:  09-23-18 @ 1325  Provider Notified: Dr Estell HarpinZammit  Orders Received/Actions taken: see new orders.

## 2018-09-23 NOTE — ED Triage Notes (Signed)
Pt c/o of RLQ pain with tenderness x 3 days.  Denies n/v/d.  LBM 09/21/18

## 2018-09-23 NOTE — Progress Notes (Signed)
Pharmacy Antibiotic Note  Tamara Santiago is a 67 y.o. female admitted on 09/23/2018 with intra-abdominal infection.  Pharmacy has been consulted for Zosyn dosing.  Plan: Zosyn 3.375g IV q8h (4 hour infusion).  Monitor labs, c/s, and patient improvement  Height: 5\' 5"  (165.1 cm) Weight: 171 lb (77.6 kg) IBW/kg (Calculated) : 57  Temp (24hrs), Avg:98.8 F (37.1 C), Min:98.4 F (36.9 C), Max:99.1 F (37.3 C)  Recent Labs  Lab 09/23/18 1159 09/23/18 1755  WBC 17.4*  --   CREATININE 0.98  --   LATICACIDVEN  --  1.6    Estimated Creatinine Clearance: 56.6 mL/min (by C-G formula based on SCr of 0.98 mg/dL).    No Known Allergies  Antimicrobials this admission: Zosyn 11/19 >>    Dose adjustments this admission: N/A  Microbiology results: 11/19 BCx: pending   Thank you for allowing pharmacy to be a part of this patient's care.  Tad MooreSteven C Fransico Sciandra 09/23/2018 8:51 PM

## 2018-09-23 NOTE — ED Provider Notes (Signed)
[facility] EMERGENCY DEPARTMENT Provider Note   CSN: [id number] Arrival date & time: 09/23/18  1058     History   Chief Complaint Chief Complaint  Patient presents with  . Abdominal Pain  . Chest Pain    HPI Tamara Santiago is a 67 y.o. female.  Patient complains of abdominal pain for couple days with some nausea.  No fevers no chills  The history is provided by the patient. No language interpreter was used.  Abdominal Pain   This is a new problem. The current episode started 2 days ago. The problem occurs constantly. The problem has not changed since onset.The pain is associated with an unknown factor. The pain is located in the RUQ. The quality of the pain is aching. The pain is at a severity of 6/10. The pain is moderate. Pertinent negatives include anorexia, diarrhea, frequency, hematuria and headaches. Nothing aggravates the symptoms. Nothing relieves the symptoms. Past workup does not include GI consult.  Chest Pain   Associated symptoms include abdominal pain. Pertinent negatives include no back pain, no cough and no headaches.  Pertinent negatives for past medical history include no seizures.    Past Medical History:  Diagnosis Date  . Acid reflux   . Hypercholesteremia   . Hypertension     There are no active problems to display for this patient.   Past Surgical History:  Procedure Laterality Date  . TUBAL LIGATION       OB History   None      Home Medications    Prior to Admission medications   Medication Sig Start Date End Date Taking? Authorizing Provider  amitriptyline (ELAVIL) 100 MG tablet Take 100 mg by mouth at bedtime.   Yes [provider]  aspirin EC 81 MG tablet Take 81 mg by mouth daily.   Yes [provider]  hydrochlorothiazide (HYDRODIURIL) 25 MG tablet Take 25 mg by mouth daily.   Yes [provider]  loratadine (CLARITIN) 10 MG tablet Take 10 mg by mouth daily.   Yes [provider]  losartan  (COZAAR) 50 MG tablet Take 50 mg by mouth daily.   Yes [provider]  metoprolol tartrate (LOPRESSOR) 50 MG tablet Take 50 mg by mouth daily.   Yes [provider]  omeprazole (PRILOSEC) 40 MG capsule Take 40 mg by mouth daily.   Yes [provider]  Vitamin D, Cholecalciferol, 1000 units TABS Take 1 tablet by mouth daily.   Yes [provider]  loperamide (IMODIUM) 2 MG capsule Take 1 capsule (2 mg total) by mouth 4 (four) times daily as needed for diarrhea or loose stools. Patient not taking: Reported on 09/23/2018 05/23/17   Long, Arlyss Repress, MD  ondansetron (ZOFRAN ODT) 4 MG disintegrating tablet Take 1 tablet (4 mg total) by mouth every 8 (eight) hours as needed for nausea or vomiting. Patient not taking: Reported on 09/23/2018 05/23/17   Long, Arlyss Repress, MD  potassium chloride SA (K-DUR,KLOR-CON) 20 MEQ tablet Take 2 tablets (40 mEq total) by mouth daily. 05/23/17 05/27/17  LongArlyss Repress, MD    Family History No family history on file.  Social History Social History   Tobacco Use  . Smoking status: Current Some Day Smoker  . Smokeless tobacco: Never Used  Substance Use Topics  . Alcohol use: No  . Drug use: No     Allergies   Patient has no known allergies.   Review of Systems Review of Systems  Constitutional: Negative  for appetite change and fatigue.  HENT: Negative for congestion, ear discharge and sinus pressure.   Eyes: Negative for discharge.  Respiratory: Negative for cough.   Cardiovascular: Positive for chest pain.  Gastrointestinal: Positive for abdominal pain. Negative for anorexia and diarrhea.  Genitourinary: Negative for frequency and hematuria.  Musculoskeletal: Negative for back pain.  Skin: Negative for rash.  Neurological: Negative for seizures and headaches.  Psychiatric/Behavioral: Negative for hallucinations.     Physical Exam Updated Vital Signs BP (!) 121/58 (BP Location: Right Arm)   Pulse (!) 102   Temp  98.4 F (36.9 C) (Oral)   Resp (!) 22   Ht 5\' 5"  (1.651 m)   Wt 77.6 kg   SpO2 95%   BMI 28.46 kg/m   Physical Exam  Constitutional: She is oriented to person, place, and time. She appears well-developed.  HENT:  Head: Normocephalic.  Eyes: Conjunctivae and EOM are normal. No scleral icterus.  Neck: Neck supple. No thyromegaly present.  Cardiovascular: Normal rate and regular rhythm. Exam reveals no gallop and no friction rub.  No murmur heard. Pulmonary/Chest: No stridor. She has no wheezes. She has no rales. She exhibits no tenderness.  Abdominal: She exhibits no distension. There is no tenderness. There is no rebound.  Tender right upper quadrant  Musculoskeletal: Normal range of motion. She exhibits no edema.  Lymphadenopathy:    She has no cervical adenopathy.  Neurological: She is oriented to person, place, and time. She exhibits normal muscle tone. Coordination normal.  Skin: No rash noted. No erythema.  Psychiatric: She has a normal mood and affect. Her behavior is normal.     ED Treatments / Results  Labs (all labs ordered are listed, but only abnormal results are displayed) Labs Reviewed  BASIC METABOLIC PANEL - Abnormal; Notable for the following components:      Result Value   Sodium 131 (*)    Potassium 2.6 (*)    Chloride 95 (*)    Glucose, Bld 135 (*)    Calcium 8.8 (*)    GFR calc non Af Amer 58 (*)    All other components within normal limits  CBC - Abnormal; Notable for the following components:   WBC 17.4 (*)    All other components within normal limits  URINALYSIS, ROUTINE W REFLEX MICROSCOPIC - Abnormal; Notable for the following components:   Color, Urine AMBER (*)    APPearance HAZY (*)    Hgb urine dipstick MODERATE (*)    Bacteria, UA RARE (*)    All other components within normal limits  DIFFERENTIAL - Abnormal; Notable for the following components:   Neutro Abs 13.8 (*)    Monocytes Absolute 1.1 (*)    All other components within normal  limits  HEPATIC FUNCTION PANEL - Abnormal; Notable for the following components:   Albumin 3.2 (*)    Bilirubin, Direct 0.3 (*)    All other components within normal limits  TROPONIN I    EKG None  Radiology Dg Chest 2 View  Result Date: 09/23/2018 CLINICAL DATA:  Weakness, chest pain, right lower quadrant pain. EXAM: CHEST - 2 VIEW COMPARISON:  None. FINDINGS: Cardiomegaly. Bibasilar atelectasis or scarring. No effusions. No acute bony abnormality. IMPRESSION: Cardiomegaly.  Bibasilar atelectasis or scarring. Electronically Signed   By: Charlett Nose M.D.   On: 09/23/2018 11:45   Ct Abdomen Pelvis W Contrast  Result Date: 09/23/2018 CLINICAL DATA:  Acute right lower quadrant abdominal pain. EXAM: CT ABDOMEN AND PELVIS WITH  CONTRAST TECHNIQUE: Multidetector CT imaging of the abdomen and pelvis was performed using the standard protocol following bolus administration of intravenous contrast. CONTRAST:  100mL ISOVUE-300 IOPAMIDOL (ISOVUE-300) INJECTION 61% COMPARISON:  None. FINDINGS: Lower chest: No acute abnormality. Hepatobiliary: Multiple hepatic cysts are noted. Mild intrahepatic biliary dilatation is noted. Gallbladder is irregular in appearance with surrounding inflammation concerning for cholecystitis. No definite cholelithiasis is noted. Pancreas: Unremarkable. No pancreatic ductal dilatation or surrounding inflammatory changes. Spleen: Normal in size without focal abnormality. Adrenals/Urinary Tract: Adrenal glands are unremarkable. Kidneys are normal, without renal calculi, focal lesion, or hydronephrosis. Bladder is unremarkable. Stomach/Bowel: The stomach appears normal. There is no evidence of bowel obstruction or inflammation. Sigmoid diverticulosis is noted without inflammation. The appendix appears normal. Mild inflammatory changes are noted around the hepatic flexure which may be related to probable cholecystitis. Vascular/Lymphatic: Aortic atherosclerosis. No enlarged abdominal or  pelvic lymph nodes. Reproductive: Uterus and bilateral adnexa are unremarkable. Other: No abdominal wall hernia or abnormality. No abdominopelvic ascites. Musculoskeletal: No acute or significant osseous findings. IMPRESSION: Findings concerning for acute cholecystitis, although cholelithiasis is not visualized. There may be secondary inflammation of the adjacent hepatic flexure. Mild intrahepatic and extrahepatic biliary dilatation is noted of unknown etiology. Aortic Atherosclerosis (ICD10-I70.0). Electronically Signed   By: Lupita RaiderJames  Green Jr, M.D.   On: 09/23/2018 14:06    Procedures Procedures (including critical care time)  Medications Ordered in ED Medications  potassium chloride 10 mEq in 100 mL IVPB ( Intravenous Rate/Dose Change 09/23/18 1441)  potassium chloride 10 mEq in 100 mL IVPB (has no administration in time range)  piperacillin-tazobactam (ZOSYN) IVPB 3.375 g (has no administration in time range)  iopamidol (ISOVUE-300) 61 % injection 100 mL (100 mLs Intravenous Contrast Given 09/23/18 1338)  HYDROmorphone (DILAUDID) injection 0.5 mg (0.5 mg Intravenous Given 09/23/18 1427)     Initial Impression / Assessment and Plan / ED Course  I have reviewed the triage vital signs and the nursing notes.  Pertinent labs & imaging results that were available during my care of the patient were reviewed by me and considered in my medical decision making (see chart for details).     CRITICAL CARE Performed by: Bethann BerkshireJoseph Geoffry Bannister Total critical care time: 40 minutes Critical care time was exclusive of separately billable procedures and treating other patients. Critical care was necessary to treat or prevent imminent or life-threatening deterioration. Critical care was time spent personally by me on the following activities: development of treatment plan with patient and/or surrogate as well as nursing, discussions with consultants, evaluation of patient's response to treatment, examination of  patient, obtaining history from patient or surrogate, ordering and performing treatments and interventions, ordering and review of laboratory studies, ordering and review of radiographic studies, pulse oximetry and re-evaluation of patient's condition. Patient with abdominal pain and possible cholecystitis.  Patient will be admitted to medicine and started on antibiotics and also given clear liquids only along with replacing her potassium.  General surgery will consult on the patient  Final Clinical Impressions(s) / ED Diagnoses   Final diagnoses:  Pain of upper abdomen    ED Discharge Orders    None       Bethann BerkshireZammit, Evelynn Hench, MD 09/23/18 1445

## 2018-09-23 NOTE — H&P (Signed)
History and Physical    Tamara Santiago ZOX:[id number] DOB: 1951-09-23 DOA: 09/23/2018  PCP: Pearson Grippe, MD  Patient coming from: Home  I have personally briefly reviewed patient's old medical records in [facility]  Chief Complaint: Abdominal pain  HPI: Tamara Santiago is a 67 y.o. female with medical history significant of hypertension, hypercholesterolemia, GERD, presents to the hospital with complaints of abdominal pain.  Symptoms started approximately 2 days ago.  She has associated nausea, but no vomiting.  Last bowel movement was 2 days ago.  She took Metamucil today without any bowel movement.  She has felt feverish.  Pain is mostly located in the epigastric and right upper quadrant, but does radiate down her right abdomen.  She does not have any cough, shortness of breath.  She is dizzy on standing.  She has had minimal p.o. intake in the past 2 days.  ED Course: WBC count noted to be elevated at 17,000.  CT scan of the abdomen and pelvis shows evidence of possible cholecystitis.  Potassium is low at 2.6.  Urinalysis does not show any signs of infection.  She is noted to be tachycardic with heart rate in the 110s.  Blood pressure has been stable.  She is currently afebrile.  Review of Systems: As per HPI otherwise 10 point review of systems negative.    Past Medical History:  Diagnosis Date  . Acid reflux   . Hypercholesteremia   . Hypertension     Past Surgical History:  Procedure Laterality Date  . TUBAL LIGATION      Social History:  reports that she has been smoking. She has never used smokeless tobacco. She reports that she does not drink alcohol or use drugs.  No Known Allergies  Family history: Family history reviewed and not pertinent  Prior to Admission medications   Medication Sig Start Date End Date Taking? Authorizing Provider  amitriptyline (ELAVIL) 100 MG tablet Take 100 mg by mouth at bedtime.   Yes [provider]  aspirin EC 81 MG tablet  Take 81 mg by mouth daily.   Yes [provider]  hydrochlorothiazide (HYDRODIURIL) 25 MG tablet Take 25 mg by mouth daily.   Yes [provider]  loratadine (CLARITIN) 10 MG tablet Take 10 mg by mouth daily.   Yes [provider]  losartan (COZAAR) 50 MG tablet Take 50 mg by mouth daily.   Yes [provider]  metoprolol tartrate (LOPRESSOR) 50 MG tablet Take 50 mg by mouth daily.   Yes [provider]  omeprazole (PRILOSEC) 40 MG capsule Take 40 mg by mouth daily.   Yes [provider]  Vitamin D, Cholecalciferol, 1000 units TABS Take 1 tablet by mouth daily.   Yes [provider]  loperamide (IMODIUM) 2 MG capsule Take 1 capsule (2 mg total) by mouth 4 (four) times daily as needed for diarrhea or loose stools. Patient not taking: Reported on 09/23/2018 05/23/17   Long, Arlyss Repress, MD  ondansetron (ZOFRAN ODT) 4 MG disintegrating tablet Take 1 tablet (4 mg total) by mouth every 8 (eight) hours as needed for nausea or vomiting. Patient not taking: Reported on 09/23/2018 05/23/17   Long, Arlyss Repress, MD  potassium chloride SA (K-DUR,KLOR-CON) 20 MEQ tablet Take 2 tablets (40 mEq total) by mouth daily. 05/23/17 05/27/17  Maia Plan, MD    Physical Exam: Vitals:   09/23/18 1430 09/23/18 1500 09/23/18 1530 09/23/18 1600  BP: 109/69 127/67 132/80 (!) 119/50  Pulse: 94 (!)  107 (!) 101 (!) 105  Resp: (!) 24 (!) 38 (!) 24 (!) 24  Temp:      TempSrc:      SpO2: 98% 96% 90% 97%  Weight:      Height:        Constitutional: NAD, calm, comfortable Eyes: PERRL, lids and conjunctivae normal ENMT: Mucous membranes are dry. Posterior pharynx clear of any exudate or lesions.Normal dentition.  Neck: normal, supple, no masses, no thyromegaly Respiratory: clear to auscultation bilaterally, no wheezing, no crackles. Normal respiratory effort. No accessory muscle use.  Cardiovascular: Regular rate and rhythm, no murmurs / rubs / gallops. No  extremity edema. 2+ pedal pulses. No carotid bruits.  Abdomen: Tender in epigastrium and right upper quadrant, no masses palpated. No hepatosplenomegaly. Bowel sounds positive.  Musculoskeletal: no clubbing / cyanosis. No joint deformity upper and lower extremities. Good ROM, no contractures. Normal muscle tone.  Skin: no rashes, lesions, ulcers. No induration Neurologic: CN 2-12 grossly intact. Sensation intact, DTR normal. Strength 5/5 in all 4.  Psychiatric: Normal judgment and insight. Alert and oriented x 3. Normal mood.    Labs on Admission: I have personally reviewed following labs and imaging studies  CBC: Recent Labs  Lab 09/23/18 1159  WBC 17.4*  NEUTROABS 13.8*  HGB 13.2  HCT 39.2  MCV 88.3  PLT 355   Basic Metabolic Panel: Recent Labs  Lab 09/23/18 1159  NA 131*  K 2.6*  CL 95*  CO2 24  GLUCOSE 135*  BUN 13  CREATININE 0.98  CALCIUM 8.8*   GFR: Estimated Creatinine Clearance: 56.6 mL/min (by C-G formula based on SCr of 0.98 mg/dL). Liver Function Tests: Recent Labs  Lab 09/23/18 1159  AST 15  ALT 12  ALKPHOS 73  BILITOT 1.2  PROT 8.0  ALBUMIN 3.2*   No results for input(s): LIPASE, AMYLASE in the last 168 hours. No results for input(s): AMMONIA in the last 168 hours. Coagulation Profile: No results for input(s): INR, PROTIME in the last 168 hours. Cardiac Enzymes: Recent Labs  Lab 09/23/18 1159  TROPONINI <0.03   BNP (last 3 results) No results for input(s): PROBNP in the last 8760 hours. HbA1C: No results for input(s): HGBA1C in the last 72 hours. CBG: No results for input(s): GLUCAP in the last 168 hours. Lipid Profile: No results for input(s): CHOL, HDL, LDLCALC, TRIG, CHOLHDL, LDLDIRECT in the last 72 hours. Thyroid Function Tests: No results for input(s): TSH, T4TOTAL, FREET4, T3FREE, THYROIDAB in the last 72 hours. Anemia Panel: No results for input(s): VITAMINB12, FOLATE, FERRITIN, TIBC, IRON, RETICCTPCT in the last 72  hours. Urine analysis:    Component Value Date/Time   COLORURINE AMBER (A) 09/23/2018 1247   APPEARANCEUR HAZY (A) 09/23/2018 1247   LABSPEC 1.019 09/23/2018 1247   PHURINE 5.0 09/23/2018 1247   GLUCOSEU NEGATIVE 09/23/2018 1247   HGBUR MODERATE (A) 09/23/2018 1247   BILIRUBINUR NEGATIVE 09/23/2018 1247   KETONESUR NEGATIVE 09/23/2018 1247   PROTEINUR NEGATIVE 09/23/2018 1247   NITRITE NEGATIVE 09/23/2018 1247   LEUKOCYTESUR NEGATIVE 09/23/2018 1247    Radiological Exams on Admission: Dg Chest 2 View  Result Date: 09/23/2018 CLINICAL DATA:  Weakness, chest pain, right lower quadrant pain. EXAM: CHEST - 2 VIEW COMPARISON:  None. FINDINGS: Cardiomegaly. Bibasilar atelectasis or scarring. No effusions. No acute bony abnormality. IMPRESSION: Cardiomegaly.  Bibasilar atelectasis or scarring. Electronically Signed   By: Charlett Nose M.D.   On: 09/23/2018 11:45   US Abdomen Complete  Result Date: 09/23/2018 CLINICAL  DATA:  Abdominal pain.  Nausea. EXAM: ABDOMEN ULTRASOUND COMPLETE COMPARISON:  CT 09/23/2018. FINDINGS: Gallbladder: Multiple non mobile gallstones noted. Gallbladder wall is thickened at 4 mm. Positive Murphy sign. Cholecystitis could present this fashion. Common bile duct: Diameter: 9 mm. Liver: Intrahepatic biliary ductal dilatation noted. 2.3 cm benign-appearing cys again noted. Increased echogenicity suggesting fatty infiltration. Portal vein is patent on color Doppler imaging with normal direction of blood flow towards the liver. IVC: No abnormality visualized. Pancreas: Visualized portion unremarkable. Spleen: Size and appearance within normal limits. Right Kidney: Length: 10.8 cm. Echogenicity within normal limits. No mass or hydronephrosis visualized. Left Kidney: Length: 10.7 cm. Echogenicity within normal limits. No mass or hydronephrosis visualized. Abdominal aorta: No aneurysm visualized. Other findings: Limited evaluation noted of bowel gas. IMPRESSION: 1. Multiple non  mobile gallstones. Gallbladder wall thickening to 4 mm noted. Positive Murphy sign. Cholecystitis could present this fashion. Dilated common bile duct to 9 mm. Dilated intrahepatic biliary ducts noted. This suggest possibility of common bile duct obstruction. MRCP can be obtained as needed. 2.  Increased echogenicity liver suggesting fatty infiltration. Electronically Signed   By: Maisie Fushomas  Register   On: 09/23/2018 15:28   Ct Abdomen Pelvis W Contrast  Result Date: 09/23/2018 CLINICAL DATA:  Acute right lower quadrant abdominal pain. EXAM: CT ABDOMEN AND PELVIS WITH CONTRAST TECHNIQUE: Multidetector CT imaging of the abdomen and pelvis was performed using the standard protocol following bolus administration of intravenous contrast. CONTRAST:  100mL ISOVUE-300 IOPAMIDOL (ISOVUE-300) INJECTION 61% COMPARISON:  None. FINDINGS: Lower chest: No acute abnormality. Hepatobiliary: Multiple hepatic cysts are noted. Mild intrahepatic biliary dilatation is noted. Gallbladder is irregular in appearance with surrounding inflammation concerning for cholecystitis. No definite cholelithiasis is noted. Pancreas: Unremarkable. No pancreatic ductal dilatation or surrounding inflammatory changes. Spleen: Normal in size without focal abnormality. Adrenals/Urinary Tract: Adrenal glands are unremarkable. Kidneys are normal, without renal calculi, focal lesion, or hydronephrosis. Bladder is unremarkable. Stomach/Bowel: The stomach appears normal. There is no evidence of bowel obstruction or inflammation. Sigmoid diverticulosis is noted without inflammation. The appendix appears normal. Mild inflammatory changes are noted around the hepatic flexure which may be related to probable cholecystitis. Vascular/Lymphatic: Aortic atherosclerosis. No enlarged abdominal or pelvic lymph nodes. Reproductive: Uterus and bilateral adnexa are unremarkable. Other: No abdominal wall hernia or abnormality. No abdominopelvic ascites. Musculoskeletal: No  acute or significant osseous findings. IMPRESSION: Findings concerning for acute cholecystitis, although cholelithiasis is not visualized. There may be secondary inflammation of the adjacent hepatic flexure. Mild intrahepatic and extrahepatic biliary dilatation is noted of unknown etiology. Aortic Atherosclerosis (ICD10-I70.0). Electronically Signed   By: Lupita RaiderJames  Green Jr, M.D.   On: 09/23/2018 14:06    EKG: Independently reviewed.  Sinus tachycardia with right bundle branch block  Assessment/Plan Active Problems:   Acute cholecystitis   Hypokalemia   Cholangitis   Sepsis (HCC)     1. Sepsis.  Likely related to cholecystitis/cholangitis.  Will provide fluids per sepsis protocol.  Blood cultures to be sent.  Check lactic acid.  She is been started on intravenous antibiotics. 2. Acute cholecystitis/acute cholangitis.  Abdominal ultrasound shows gallstones with dilated common bile duct and intrahepatic ductal dilatation.  Discussed with general surgery and will order MRCP.  She is on clear liquids and will be kept n.p.o. after midnight.  We will add on amylase and lipase to previously drawn labs.  Continue on intravenous Zosyn. 3. Hypokalemia.  Likely related to hydrochlorothiazide use and decreased p.o. intake.  Hold further HCTZ.  Replace potassium and  check magnesium. 4. GERD.  Continue on PPI  DVT prophylaxis: heparin  Code Status: full code  Family Communication: discussed with family at the bedside  Disposition Plan: discharge home once improved  Consults called: general surgery  Admission status: inpatient, telemetry   Erick Blinks MD [facility] Pager [phone]  If 7PM-7AM, please contact night-coverage [email or web address] Password [email or web address]  09/23/2018, 5:27 PM

## 2018-09-24 ENCOUNTER — Inpatient Hospital Stay (HOSPITAL_COMMUNITY): Payer: Medicare Other | Admitting: Anesthesiology

## 2018-09-24 ENCOUNTER — Encounter (HOSPITAL_COMMUNITY)
Admission: EM | Disposition: A | Discharge status: Home Health | Payer: Self-pay | Source: Home / Self Care | Attending: General Surgery

## 2018-09-24 DIAGNOSIS — K810 Acute cholecystitis: Secondary | ICD-10-CM

## 2018-09-24 HISTORY — PX: CHOLECYSTECTOMY: SHX55

## 2018-09-24 LAB — HIV ANTIBODY (ROUTINE TESTING W REFLEX): HIV Screen 4th Generation wRfx: NONREACTIVE

## 2018-09-24 LAB — COMPREHENSIVE METABOLIC PANEL
ALT: 13 U/L (ref 0–44)
AST: 14 U/L — ABNORMAL LOW (ref 15–41)
Albumin: 2.7 g/dL — ABNORMAL LOW (ref 3.5–5.0)
Alkaline Phosphatase: 68 U/L (ref 38–126)
Anion gap: 7 (ref 5–15)
BUN: 9 mg/dL (ref 8–23)
CO2: 24 mmol/L (ref 22–32)
Calcium: 8 mg/dL — ABNORMAL LOW (ref 8.9–10.3)
Chloride: 104 mmol/L (ref 98–111)
Creatinine, Ser: 0.94 mg/dL (ref 0.44–1.00)
GFR calc Af Amer: >60 mL/min (ref >60)
GFR calc non Af Amer: >60 mL/min (ref >60)
Glucose, Bld: 107 mg/dL — ABNORMAL HIGH (ref 70–99)
Potassium: 3.7 mmol/L (ref 3.5–5.1)
Sodium: 135 mmol/L (ref 135–145)
Total Bilirubin: 1.5 mg/dL — ABNORMAL HIGH (ref 0.3–1.2)
Total Protein: 6.8 g/dL (ref 6.5–8.1)

## 2018-09-24 LAB — CBC
HCT: 37.1 % (ref 36.0–46.0)
Hemoglobin: 12 g/dL (ref 12.0–15.0)
MCH: 29.6 pg (ref 26.0–34.0)
MCHC: 32.3 g/dL (ref 30.0–36.0)
MCV: 91.4 fL (ref 80.0–100.0)
Platelets: 301 10*3/uL (ref 150–400)
RBC: 4.06 MIL/uL (ref 3.87–5.11)
RDW: 13.7 % (ref 11.5–15.5)
WBC: 17.5 10*3/uL — ABNORMAL HIGH (ref 4.0–10.5)
nRBC: 0 % (ref 0.0–0.2)

## 2018-09-24 LAB — SURGICAL PCR SCREEN
MRSA, PCR: NEGATIVE
Staphylococcus aureus: POSITIVE — AB

## 2018-09-24 SURGERY — LAPAROSCOPIC CHOLECYSTECTOMY
Anesthesia: General

## 2018-09-24 MED ORDER — CHLORHEXIDINE GLUCONATE CLOTH 2 % EX PADS: 6.0000 | MEDICATED_PAD | Freq: Once | CUTANEOUS | Status: DC

## 2018-09-24 MED ORDER — PROPOFOL 10 MG/ML IV BOLUS
INTRAVENOUS | Status: DC | PRN
  Administered 2018-09-24: 140 mg via INTRAVENOUS

## 2018-09-24 MED ORDER — LOSARTAN POTASSIUM 50 MG PO TABS
50.0000 mg | ORAL_TABLET | Freq: Every day | ORAL | Status: DC
  Administered 2018-09-24 – 2018-09-28 (×5): 50 mg via ORAL
  Filled 2018-09-24 (×5): qty 1

## 2018-09-24 MED ORDER — HYDROMORPHONE HCL 1 MG/ML IJ SOLN
1.0000 mg | INTRAMUSCULAR | Status: DC | PRN
  Administered 2018-09-24 – 2018-09-25 (×4): 1 mg via INTRAVENOUS
  Filled 2018-09-24 (×4): qty 1

## 2018-09-24 MED ORDER — BUPIVACAINE HCL (PF) 0.5 % IJ SOLN
INTRAMUSCULAR | Status: AC
  Filled 2018-09-24: qty 30

## 2018-09-24 MED ORDER — HEMOSTATIC AGENTS (NO CHARGE) OPTIME
TOPICAL | Status: DC | PRN
  Administered 2018-09-24: 1 via TOPICAL

## 2018-09-24 MED ORDER — FENTANYL CITRATE (PF) 100 MCG/2ML IJ SOLN
INTRAMUSCULAR | Status: DC | PRN
  Administered 2018-09-24 (×3): 50 ug via INTRAVENOUS

## 2018-09-24 MED ORDER — HYDROMORPHONE HCL 1 MG/ML IJ SOLN
INTRAMUSCULAR | Status: AC
  Filled 2018-09-24: qty 1

## 2018-09-24 MED ORDER — LACTATED RINGERS IV SOLN
INTRAVENOUS | Status: DC
  Administered 2018-09-24: 09:00:00 via INTRAVENOUS

## 2018-09-24 MED ORDER — BUPIVACAINE LIPOSOME 1.3 % IJ SUSP
INTRAMUSCULAR | Status: AC
  Filled 2018-09-24: qty 20

## 2018-09-24 MED ORDER — LORAZEPAM 2 MG/ML IJ SOLN: 0.5000 mg | INTRAMUSCULAR | Status: DC | PRN

## 2018-09-24 MED ORDER — METOPROLOL TARTRATE 5 MG/5ML IV SOLN
INTRAVENOUS | Status: AC
  Filled 2018-09-24: qty 5

## 2018-09-24 MED ORDER — SODIUM CHLORIDE 0.9 % IR SOLN
Status: DC | PRN
  Administered 2018-09-24: 1000 mL

## 2018-09-24 MED ORDER — KETOROLAC TROMETHAMINE 15 MG/ML IJ SOLN
15.0000 mg | Freq: Four times a day (QID) | INTRAMUSCULAR | Status: DC | PRN
  Administered 2018-09-24 – 2018-09-26 (×3): 15 mg via INTRAVENOUS
  Filled 2018-09-24 (×3): qty 1

## 2018-09-24 MED ORDER — SUGAMMADEX SODIUM 500 MG/5ML IV SOLN
INTRAVENOUS | Status: DC | PRN
  Administered 2018-09-24: 400 mg via INTRAVENOUS

## 2018-09-24 MED ORDER — MIDAZOLAM HCL 2 MG/2ML IJ SOLN
INTRAMUSCULAR | Status: AC
  Filled 2018-09-24: qty 2

## 2018-09-24 MED ORDER — METOPROLOL TARTRATE 5 MG/5ML IV SOLN
INTRAVENOUS | Status: DC | PRN
  Administered 2018-09-24 (×2): 1 mg via INTRAVENOUS

## 2018-09-24 MED ORDER — FENTANYL CITRATE (PF) 250 MCG/5ML IJ SOLN
INTRAMUSCULAR | Status: AC
  Filled 2018-09-24: qty 5

## 2018-09-24 MED ORDER — METOPROLOL TARTRATE 50 MG PO TABS
50.0000 mg | ORAL_TABLET | Freq: Every day | ORAL | Status: DC
  Administered 2018-09-24 – 2018-09-28 (×5): 50 mg via ORAL
  Filled 2018-09-24 (×5): qty 1

## 2018-09-24 MED ORDER — ENOXAPARIN SODIUM 40 MG/0.4ML ~~LOC~~ SOLN
40.0000 mg | SUBCUTANEOUS | Status: DC
  Administered 2018-09-25 – 2018-09-28 (×4): 40 mg via SUBCUTANEOUS
  Filled 2018-09-24 (×4): qty 0.4

## 2018-09-24 MED ORDER — POVIDONE-IODINE 10 % OINT PACKET
TOPICAL_OINTMENT | CUTANEOUS | Status: DC | PRN
  Administered 2018-09-24: 1 via TOPICAL

## 2018-09-24 MED ORDER — BUPIVACAINE LIPOSOME 1.3 % IJ SUSP
INTRAMUSCULAR | Status: DC | PRN
  Administered 2018-09-24: 20 mL

## 2018-09-24 MED ORDER — PROMETHAZINE HCL 25 MG/ML IJ SOLN: 6.2500 mg | INTRAMUSCULAR | Status: DC | PRN

## 2018-09-24 MED ORDER — ROCURONIUM BROMIDE 100 MG/10ML IV SOLN
INTRAVENOUS | Status: DC | PRN
  Administered 2018-09-24: 50 mg via INTRAVENOUS

## 2018-09-24 MED ORDER — SIMETHICONE 80 MG PO CHEW
40.0000 mg | CHEWABLE_TABLET | Freq: Four times a day (QID) | ORAL | Status: DC | PRN
  Administered 2018-09-26: 40 mg via ORAL
  Filled 2018-09-24: qty 1

## 2018-09-24 MED ORDER — ONDANSETRON HCL 4 MG/2ML IJ SOLN
INTRAMUSCULAR | Status: DC | PRN
  Administered 2018-09-24: 4 mg via INTRAVENOUS

## 2018-09-24 MED ORDER — HYDROCODONE-ACETAMINOPHEN 7.5-325 MG PO TABS: 1.0000 | ORAL_TABLET | Freq: Once | ORAL | Status: DC | PRN

## 2018-09-24 MED ORDER — SODIUM CHLORIDE 0.9 % IV SOLN
INTRAVENOUS | Status: DC
  Administered 2018-09-24 – 2018-09-25 (×3): via INTRAVENOUS

## 2018-09-24 MED ORDER — MIDAZOLAM HCL 2 MG/2ML IJ SOLN: 0.5000 mg | Freq: Once | INTRAMUSCULAR | Status: DC | PRN

## 2018-09-24 MED ORDER — HYDROCODONE-ACETAMINOPHEN 5-325 MG PO TABS
1.0000 | ORAL_TABLET | ORAL | Status: DC | PRN
  Administered 2018-09-24: 2 via ORAL
  Administered 2018-09-25 – 2018-09-27 (×3): 1 via ORAL
  Administered 2018-09-28: 2 via ORAL
  Administered 2018-09-28: 1 via ORAL
  Filled 2018-09-24: qty 2
  Filled 2018-09-24 (×4): qty 1
  Filled 2018-09-24: qty 2

## 2018-09-24 MED ORDER — PROPOFOL 10 MG/ML IV BOLUS
INTRAVENOUS | Status: AC
  Filled 2018-09-24: qty 20

## 2018-09-24 MED ORDER — HYDROMORPHONE HCL 1 MG/ML IJ SOLN
0.5000 mg | INTRAMUSCULAR | Status: DC | PRN
  Administered 2018-09-24 (×2): 0.5 mg via INTRAVENOUS

## 2018-09-24 SURGICAL SUPPLY — 57 items
APL SRG 38 LTWT LNG FL B (MISCELLANEOUS) ×1
APPLICATOR ARISTA FLEXITIP XL (MISCELLANEOUS) ×3 IMPLANT
APPLIER CLIP ROT 10 11.4 M/L (STAPLE) ×3
APR CLP MED LRG 11.4X10 (STAPLE) ×1
BAG RETRIEVAL 10 (BASKET) ×1
BAG RETRIEVAL 10MM (BASKET) ×1
CHLORAPREP W/TINT 26ML (MISCELLANEOUS) ×3 IMPLANT
CLIP APPLIE ROT 10 11.4 M/L (STAPLE) ×1 IMPLANT
CLOTH BEACON ORANGE TIMEOUT ST (SAFETY) ×3 IMPLANT
COVER LIGHT HANDLE STERIS (MISCELLANEOUS) ×6 IMPLANT
CUTTER FLEX LINEAR 45M (STAPLE) ×3 IMPLANT
ELECT REM PT RETURN 9FT ADLT (ELECTROSURGICAL) ×3
ELECTRODE REM PT RTRN 9FT ADLT (ELECTROSURGICAL) ×1 IMPLANT
FILTER SMOKE EVAC LAPAROSHD (FILTER) ×3 IMPLANT
GLOVE BIOGEL M 6.5 STRL (GLOVE) ×3 IMPLANT
GLOVE BIOGEL M 7.0 STRL (GLOVE) ×3 IMPLANT
GLOVE BIOGEL PI IND STRL 6.5 (GLOVE) ×2 IMPLANT
GLOVE BIOGEL PI IND STRL 7.0 (GLOVE) ×1 IMPLANT
GLOVE BIOGEL PI IND STRL 7.5 (GLOVE) ×1 IMPLANT
GLOVE BIOGEL PI INDICATOR 6.5 (GLOVE) ×4
GLOVE BIOGEL PI INDICATOR 7.0 (GLOVE) ×2
GLOVE BIOGEL PI INDICATOR 7.5 (GLOVE) ×2
GLOVE SURG SS PI 7.5 STRL IVOR (GLOVE) ×3 IMPLANT
GOWN STRL REUS W/ TWL XL LVL3 (GOWN DISPOSABLE) ×1 IMPLANT
GOWN STRL REUS W/TWL LRG LVL3 (GOWN DISPOSABLE) ×6 IMPLANT
GOWN STRL REUS W/TWL XL LVL3 (GOWN DISPOSABLE) ×3
HEMOSTAT ARISTA ABSORB 3G PWDR (MISCELLANEOUS) ×3 IMPLANT
HEMOSTAT SNOW SURGICEL 2X4 (HEMOSTASIS) ×3 IMPLANT
INST SET LAPROSCOPIC AP (KITS) ×3 IMPLANT
IV NS IRRIG 3000ML ARTHROMATIC (IV SOLUTION) ×3 IMPLANT
KIT TURNOVER KIT A (KITS) ×3 IMPLANT
MANIFOLD NEPTUNE II (INSTRUMENTS) ×3 IMPLANT
NEEDLE HYPO 18GX1.5 BLUNT FILL (NEEDLE) ×3 IMPLANT
NEEDLE HYPO 22GX1.5 SAFETY (NEEDLE) ×3 IMPLANT
NEEDLE INSUFFLATION 14GA 120MM (NEEDLE) ×3 IMPLANT
NS IRRIG 1000ML POUR BTL (IV SOLUTION) ×3 IMPLANT
PACK LAP CHOLE LZT030E (CUSTOM PROCEDURE TRAY) ×3 IMPLANT
PAD ARMBOARD 7.5X6 YLW CONV (MISCELLANEOUS) ×3 IMPLANT
RELOAD 45 VASCULAR/THIN (ENDOMECHANICALS) ×3 IMPLANT
SET BASIN LINEN APH (SET/KITS/TRAYS/PACK) ×3 IMPLANT
SET TUBE IRRIG SUCTION NO TIP (IRRIGATION / IRRIGATOR) ×3 IMPLANT
SLEEVE ENDOPATH XCEL 5M (ENDOMECHANICALS) ×3 IMPLANT
SPONGE GAUZE 2X2 8PLY STER LF (GAUZE/BANDAGES/DRESSINGS) ×3
SPONGE GAUZE 2X2 8PLY STRL LF (GAUZE/BANDAGES/DRESSINGS) ×6 IMPLANT
STAPLER VISISTAT (STAPLE) ×3 IMPLANT
SUT VICRYL 0 UR6 27IN ABS (SUTURE) ×3 IMPLANT
SYR 20CC LL (SYRINGE) ×3 IMPLANT
SYS BAG RETRIEVAL 10MM (BASKET) ×1
SYSTEM BAG RETRIEVAL 10MM (BASKET) ×1 IMPLANT
TAPE CLOTH SURG 4X10 WHT LF (GAUZE/BANDAGES/DRESSINGS) ×3 IMPLANT
TROCAR ENDO BLADELESS 11MM (ENDOMECHANICALS) ×3 IMPLANT
TROCAR XCEL NON-BLD 5MMX100MML (ENDOMECHANICALS) ×3 IMPLANT
TROCAR XCEL UNIV SLVE 11M 100M (ENDOMECHANICALS) ×3 IMPLANT
TUBE CONNECTING 12'X1/4 (SUCTIONS) ×1
TUBE CONNECTING 12X1/4 (SUCTIONS) ×2 IMPLANT
TUBING INSUFFLATION (TUBING) ×3 IMPLANT
WARMER LAPAROSCOPE (MISCELLANEOUS) ×3 IMPLANT

## 2018-09-24 NOTE — Anesthesia Preprocedure Evaluation (Signed)
Anesthesia Evaluation  Patient identified by MRN, date of birth, ID band Patient awake    Reviewed: Allergy & Precautions, NPO status , Patient's Chart, lab work & pertinent test results, reviewed documented beta blocker date and time   Airway Mallampati: III  TM Distance: >3 FB Neck ROM: Full    Dental no notable dental hx. (+) Poor Dentition, Missing   Pulmonary neg pulmonary ROS, Current Smoker,   Slight wheeze appreciated on the right  Pulmonary exam normal breath sounds clear to auscultation + wheezing      Cardiovascular Exercise Tolerance: Good hypertension, Pt. on medications negative cardio ROS Normal cardiovascular examI Rhythm:Regular Rate:Normal     Neuro/Psych negative neurological ROS  negative psych ROS   GI/Hepatic Neg liver ROS, GERD  Medicated and Controlled,Denies GERD Sx today  Appears uncomfortable at rest    Endo/Other  negative endocrine ROS  Renal/GU negative Renal ROS  negative genitourinary   Musculoskeletal negative musculoskeletal ROS (+)   Abdominal   Peds negative pediatric ROS (+)  Hematology negative hematology ROS (+)   Anesthesia Other Findings   Reproductive/Obstetrics negative OB ROS                             Anesthesia Physical Anesthesia Plan  ASA: II  Anesthesia Plan: General   Post-op Pain Management:    Induction: Intravenous  PONV Risk Score and Plan:   Airway Management Planned: Oral ETT  Additional Equipment:   Intra-op Plan:   Post-operative Plan: Extubation in OR  Informed Consent: I have reviewed the patients History and Physical, chart, labs and discussed the procedure including the risks, benefits and alternatives for the proposed anesthesia with the patient or authorized representative who has indicated his/her understanding and acceptance.   Dental advisory given  Plan Discussed with: CRNA  Anesthesia Plan  Comments:         Anesthesia Quick Evaluation

## 2018-09-24 NOTE — Op Note (Signed)
Patient:  Tamara CatenaJoslane Eichenberger  DOB:  07/28/1951  MRN:  [id number]   Preop Diagnosis: Acute cholecystitis, cholelithiasis  Postop Diagnosis: Same  Procedure: Laparoscopic cholecystectomy  Surgeon: Franky MachoMark Daylah Sayavong, MD  Assistant: Larae GroomsLindsey Bridges, MD  Anes: General endotracheal  Indications: Patient is a 67 year old white female who was admitted to the hospital for acute cholecystitis secondary to cholelithiasis.  Risks and benefits of the procedure including bleeding, infection, hepatobiliary injury, and the possibility of an open procedure were fully explained to the patient, who gave informed consent.  Procedure note: The patient was placed in supine position.  After induction of general endotracheal anesthesia, the abdomen was prepped and draped using the usual sterile technique with DuraPrep.  Surgical site confirmation was performed.  A supraumbilical incision was made down to the fascia.  A Veress needle was introduced into the abdominal cavity and confirmation of placement was done using the saline drop test.  The abdomen was then insufflated to 15 mmHg pressure.  11 mm trocar was induced into the abdominal cavity under direct visualization without difficulty.  Patient was placed in reverse Trendelenburg position and an additional 11 mm trocar was placed in the epigastric region and 5 mm trochars were placed in the upper quadrant and right flank regions.  Omentum was covering the gallbladder.  The gallbladder wall was significantly inflamed with areas of necrosis.  The omentum was freed away from the gallbladder bluntly.  The gallbladder was then retracted in a dynamic fashion in order to provide a critical view of the triangle of Calot.  The empyema of the gallbladder was seen.  The cystic artery was first identified.  The junction to the infundibulum was fully identified.  Endoclips were placed proximally distally on cystic artery, and the artery was divided.  The cystic duct was fully identified.   A vascular Endo GIA was placed across the cystic duct and fired.  The gallbladder was then freed away from the gallbladder fossa using Bovie electrocautery and blunt dissection.  The gallbladder as well as some spilled stones were placed into an Endo Catch bag and removed.  The gallbladder fossa was inspected and no significant bleeding or bile leakage was noted.  Arista and Surgicel were placed in the gallbladder fossa.  The right upper quadrant was irrigated with normal saline.  All fluid and air within her back from the abdominal cavity prior to removal of the trochars.  All wounds were irrigated with normal saline.  All wounds were injected with Exparel.  The supraumbilical fascia was reapproximated using 0 Vicryl interrupted suture.  All skin incisions were closed using staples.  Betadine ointment and a dry sterile dressings were applied.  All tape needle counts were correct at the end of the procedure.  Patient was extubated in the operating room and transferred to PACU in stable condition.  Dr. Henreitta LeberBridges assisted throughout the procedure.  Complications: None  EBL: Minimal  Specimen: Gallbladder

## 2018-09-24 NOTE — Transfer of Care (Signed)
Immediate Anesthesia Transfer of Care Note  Patient: Tamara Santiago  Procedure(s) Performed: LAPAROSCOPIC CHOLECYSTECTOMY (N/A )  Patient Location: PACU  Anesthesia Type:General  Level of Consciousness: awake and patient cooperative  Airway & Oxygen Therapy: Patient Spontanous Breathing and non-rebreather face mask  Post-op Assessment: Report given to RN and Post -op Vital signs reviewed and stable  Post vital signs: Reviewed and stable  Last Vitals:  Vitals Value Taken Time  BP    Temp    Pulse 111 09/24/2018 10:15 AM  Resp    SpO2 95 % 09/24/2018 10:15 AM  Vitals shown include unvalidated device data.  Last Pain:  Vitals:   09/24/18 0834  TempSrc: Oral  PainSc: 10-Worst pain ever      Patients Stated Pain Goal: 7 (09/24/18 [id number])  Complications: No apparent anesthesia complications

## 2018-09-24 NOTE — Anesthesia Procedure Notes (Signed)
Procedure Name: Intubation Date/Time: 09/24/2018 9:18 AM Performed by: Vista Deck, CRNA Pre-anesthesia Checklist: Patient identified, Patient being monitored, Timeout performed, Emergency Drugs available and Suction available Patient Re-evaluated:Patient Re-evaluated prior to induction Oxygen Delivery Method: Circle System Utilized Preoxygenation: Pre-oxygenation with 100% oxygen Induction Type: IV induction Ventilation: Mask ventilation without difficulty Laryngoscope Size: Mac and 3 Grade View: Grade I Tube type: Oral Tube size: 7.0 mm Number of attempts: 1 Airway Equipment and Method: stylet Placement Confirmation: ETT inserted through vocal cords under direct vision,  positive ETCO2 and breath sounds checked- equal and bilateral Secured at: 21 cm Tube secured with: Tape Dental Injury: Teeth and Oropharynx as per pre-operative assessment

## 2018-09-24 NOTE — Consult Note (Signed)
Reason for Consult: Cholecystitis, cholelithiasis Referring Physician: Dr. Gae Gallop Tamara Santiago is an 67 y.o. female.  HPI: Patient is a 67 year old white female who presents the emergency room with a several day history of worsening right upper quadrant abdominal pain, nausea, vomiting.  CT scan of the abdomen was performed which revealed acute cholecystitis.  She was also noted to have dilated hepatobiliary tree.  MRCP done yesterday evening reveals no evidence of choledocholithiasis.  She does have a significant leukocytosis.  Her liver enzyme tests are for the most part unremarkable.  This morning, she continues to have right upper quadrant abdominal pain and a leukocytosis.  She states her pain is somewhat eased with IV pain medication.  She has had a similar episode in the past, but this resolved spontaneously.  Past Medical History:  Diagnosis Date  . Acid reflux   . Hypercholesteremia   . Hypertension     Past Surgical History:  Procedure Laterality Date  . TUBAL LIGATION      History reviewed. No pertinent family history.  Social History:  reports that she has been smoking. She has never used smokeless tobacco. She reports that she does not drink alcohol or use drugs.  Allergies: No Known Allergies  Medications: I have reviewed the patient's current medications.  Results for orders placed or performed during the hospital encounter of 09/23/18 (from the past 48 hour(s))  Basic metabolic panel     Status: Abnormal   Collection Time: 09/23/18 11:59 AM  Result Value Ref Range   Sodium 131 (L) 135 - 145 mmol/L   Potassium 2.6 (LL) 3.5 - 5.1 mmol/L    Comment: CRITICAL RESULT CALLED TO, READ BACK BY AND VERIFIED WITH: DAFANIE MARTIN 09/23/18 @ 1320 LLS    Chloride 95 (L) 98 - 111 mmol/L   CO2 24 22 - 32 mmol/L   Glucose, Bld 135 (H) 70 - 99 mg/dL   BUN 13 8 - 23 mg/dL   Creatinine, Ser 0.98 0.44 - 1.00 mg/dL   Calcium 8.8 (L) 8.9 - 10.3 mg/dL   GFR calc non Af Amer 58 (L)  >60 mL/min   GFR calc Af Amer >60 >60 mL/min    Comment: (NOTE) The eGFR has been calculated using the CKD EPI equation. This calculation has not been validated in all clinical situations. eGFR's persistently <60 mL/min signify possible Chronic Kidney Disease.    Anion gap 12 5 - 15    Comment: Performed at [facility], [address], [place], [place] [address]  CBC     Status: Abnormal   Collection Time: 09/23/18 11:59 AM  Result Value Ref Range   WBC 17.4 (H) 4.0 - 10.5 K/uL   RBC 4.44 3.87 - 5.11 MIL/uL   Hemoglobin 13.2 12.0 - 15.0 g/dL   HCT 39.2 36.0 - 46.0 %   MCV 88.3 80.0 - 100.0 fL   MCH 29.7 26.0 - 34.0 pg   MCHC 33.7 30.0 - 36.0 g/dL   RDW 13.3 11.5 - 15.5 %   Platelets 355 150 - 400 K/uL   nRBC 0.0 0.0 - 0.2 %    Comment: Performed at [facility], [address] [place], [place] [address]  Troponin I - ONCE - STAT     Status: None   Collection Time: 09/23/18 11:59 AM  Result Value Ref Range   Troponin I <0.03 <0.03 ng/mL    Comment: Performed at [facility], [address] [place], [place] [address]  Differential  Status: Abnormal   Collection Time: 09/23/18 11:59 AM  Result Value Ref Range   Neutrophils Relative % 79 %   Neutro Abs 13.8 (H) 1.7 - 7.7 K/uL   Lymphocytes Relative 13 %   Lymphs Abs 2.3 0.7 - 4.0 K/uL   Monocytes Relative 6 %   Monocytes Absolute 1.1 (H) 0.1 - 1.0 K/uL   Eosinophils Relative 0 %   Eosinophils Absolute 0.1 0.0 - 0.5 K/uL   Basophils Relative 0 %   Basophils Absolute 0.0 0.0 - 0.1 K/uL    Comment: Performed at [facility], [address] [place], [place] [address]  Hepatic function panel     Status: Abnormal   Collection Time: 09/23/18 11:59 AM  Result Value Ref Range   Total Protein 8.0 6.5 - 8.1 g/dL   Albumin 3.2 (L) 3.5 - 5.0 g/dL   AST 15 15 - 41 U/L   ALT 12 0 - 44 U/L   Alkaline Phosphatase 73 38 - 126 U/L   Total Bilirubin 1.2 0.3 - 1.2 mg/dL   Bilirubin, Direct 0.3 (H) 0.0 - 0.2 mg/dL   Indirect Bilirubin  0.9 0.3 - 0.9 mg/dL    Comment: Performed at [facility], [address] [place], [place] [address]  Amylase     Status: None   Collection Time: 09/23/18 11:59 AM  Result Value Ref Range   Amylase 33 28 - 100 U/L    Comment: Performed at [facility], [address], [place], [place] [address]  Lipase, blood     Status: None   Collection Time: 09/23/18 11:59 AM  Result Value Ref Range   Lipase 22 11 - 51 U/L    Comment: Performed at [facility], [address] [place], [place] [address]  Urinalysis, Routine w reflex microscopic     Status: Abnormal   Collection Time: 09/23/18 12:47 PM  Result Value Ref Range   Color, Urine AMBER (A) YELLOW    Comment: BIOCHEMICALS MAY BE AFFECTED BY COLOR   APPearance HAZY (A) CLEAR   Specific Gravity, Urine 1.019 1.005 - 1.030   pH 5.0 5.0 - 8.0   Glucose, UA NEGATIVE NEGATIVE mg/dL   Hgb urine dipstick MODERATE (A) NEGATIVE   Bilirubin Urine NEGATIVE NEGATIVE   Ketones, ur NEGATIVE NEGATIVE mg/dL   Protein, ur NEGATIVE NEGATIVE mg/dL   Nitrite NEGATIVE NEGATIVE   Leukocytes, UA NEGATIVE NEGATIVE   RBC / HPF 0-5 0 - 5 RBC/hpf   WBC, UA 0-5 0 - 5 WBC/hpf   Bacteria, UA RARE (A) NONE SEEN   Squamous Epithelial / LPF 0-5 0 - 5   Mucus PRESENT    Hyaline Casts, UA PRESENT     Comment: Performed at [facility], [address], [place], [place] [address]  Procalcitonin     Status: None   Collection Time: 09/23/18  5:49 PM  Result Value Ref Range   Procalcitonin 2.85 ng/mL    Comment:        Interpretation: PCT > 2 ng/mL: Systemic infection (sepsis) is likely, unless other causes are known. (NOTE)       Sepsis PCT Algorithm           Lower Respiratory Tract                                      Infection PCT Algorithm    ----------------------------     ----------------------------  PCT < 0.25 ng/mL                PCT < 0.10 ng/mL         Strongly encourage             Strongly discourage   discontinuation of antibiotics     initiation of antibiotics    ----------------------------     -----------------------------       PCT 0.25 - 0.50 ng/mL            PCT 0.10 - 0.25 ng/mL               OR       >80% decrease in PCT            Discourage initiation of                                            antibiotics      Encourage discontinuation           of antibiotics    ----------------------------     -----------------------------         PCT >= 0.50 ng/mL              PCT 0.26 - 0.50 ng/mL               AND       <80% decrease in PCT              Encourage initiation of                                             antibiotics       Encourage continuation           of antibiotics    ----------------------------     -----------------------------        PCT >= 0.50 ng/mL                  PCT > 0.50 ng/mL               AND         increase in PCT                  Strongly encourage                                      initiation of antibiotics    Strongly encourage escalation           of antibiotics                                     -----------------------------                                           PCT <= 0.25 ng/mL  OR                                        > 80% decrease in PCT                                     Discontinue / Do not initiate                                             antibiotics Performed at [facility], [address] [place], [place] [address]   Protime-INR     Status: Abnormal   Collection Time: 09/23/18  5:49 PM  Result Value Ref Range   Prothrombin Time 15.6 (H) 11.4 - 15.2 seconds   INR 1.25     Comment: Performed at [facility], [address] [place], [place] [address]  APTT     Status: None   Collection Time: 09/23/18  5:49 PM  Result Value Ref Range   aPTT 32 24 - 36 seconds    Comment: Performed at [facility], [address] [place], [place] [address]  Magnesium     Status: None   Collection Time: 09/23/18  5:49  PM  Result Value Ref Range   Magnesium 1.8 1.7 - 2.4 mg/dL    Comment: Performed at [facility], [address], [place], [place] [address]  Culture, blood (x 2)     Status: None (Preliminary result)   Collection Time: 09/23/18  5:54 PM  Result Value Ref Range   Specimen Description RIGHT ANTECUBITAL    Special Requests      BOTTLES DRAWN AEROBIC AND ANAEROBIC Blood Culture adequate volume   Culture      NO GROWTH < 24 HOURS Performed at [facility], [address] [place], [place] [address]    Report Status PENDING   Lactic acid, plasma     Status: None   Collection Time: 09/23/18  5:55 PM  Result Value Ref Range   Lactic Acid, Venous 1.6 0.5 - 1.9 mmol/L    Comment: Performed at [facility], [address] [place], [place] [address]  Culture, blood (x 2)     Status: None (Preliminary result)   Collection Time: 09/23/18  6:03 PM  Result Value Ref Range   Specimen Description BLOOD RIGHT HAND    Special Requests      BOTTLES DRAWN AEROBIC AND ANAEROBIC Blood Culture adequate volume   Culture      NO GROWTH < 24 HOURS Performed at [facility], [address], [place], [place] [address]    Report Status PENDING   Lactic acid, plasma     Status: None   Collection Time: 09/23/18  8:31 PM  Result Value Ref Range   Lactic Acid, Venous 1.1 0.5 - 1.9 mmol/L    Comment: Performed at [facility], [address], [place], [place] [address]  Surgical PCR screen     Status: Abnormal   Collection Time: 09/23/18  9:13 PM  Result Value Ref Range   MRSA, PCR NEGATIVE NEGATIVE   Staphylococcus aureus POSITIVE (A) NEGATIVE    Comment: RESULT CALLED TO, READ BACK BY AND VERIFIED WITH: C HOPKINS,RN _0  09/24/18 MKELLY (NOTE) The  Xpert SA Assay (FDA approved for NASAL specimens in patients 59 years of age and older), is one component of a comprehensive surveillance program. It is not intended to diagnose infection nor to guide or monitor treatment. Performed at [facility], [address], [place], [place] [address]   Comprehensive metabolic panel     Status: Abnormal   Collection Time: 09/24/18  5:25 AM  Result Value Ref Range   Sodium 135 135 - 145 mmol/L   Potassium 3.7 3.5 - 5.1 mmol/L    Comment: DELTA CHECK NOTED   Chloride 104 98 - 111 mmol/L   CO2 24 22 - 32 mmol/L   Glucose, Bld 107 (H) 70 - 99 mg/dL   BUN 9 8 - 23 mg/dL   Creatinine, Ser 0.94 0.44 - 1.00 mg/dL   Calcium 8.0 (L) 8.9 - 10.3 mg/dL   Total Protein 6.8 6.5 - 8.1 g/dL   Albumin 2.7 (L) 3.5 - 5.0 g/dL   AST 14 (L) 15 - 41 U/L   ALT 13 0 - 44 U/L   Alkaline Phosphatase 68 38 - 126 U/L   Total Bilirubin 1.5 (H) 0.3 - 1.2 mg/dL   GFR calc non Af Amer >60 >60 mL/min   GFR calc Af Amer >60 >60 mL/min    Comment: (NOTE) The eGFR has been calculated using the CKD EPI equation. This calculation has not been validated in all clinical situations. eGFR's persistently <60 mL/min signify possible Chronic Kidney Disease.    Anion gap 7 5 - 15    Comment: Performed at [facility], [address] [place], [place] [address]  CBC     Status: Abnormal   Collection Time: 09/24/18  5:25 AM  Result Value Ref Range   WBC 17.5 (H) 4.0 - 10.5 K/uL   RBC 4.06 3.87 - 5.11 MIL/uL   Hemoglobin 12.0 12.0 - 15.0 g/dL   HCT 37.1 36.0 - 46.0 %   MCV 91.4 80.0 - 100.0 fL   MCH 29.6 26.0 - 34.0 pg   MCHC 32.3 30.0 - 36.0 g/dL   RDW 13.7 11.5 - 15.5 %   Platelets 301 150 - 400 K/uL   nRBC 0.0 0.0 - 0.2 %    Comment: Performed at [facility], [address], [place], [place] [address]    Dg Chest 2 View  Result Date: 09/23/2018 CLINICAL DATA:  Weakness, chest pain, right lower quadrant pain. EXAM: CHEST - 2 VIEW COMPARISON:  None. FINDINGS: Cardiomegaly. Bibasilar atelectasis or scarring. No effusions. No acute bony abnormality. IMPRESSION: Cardiomegaly.  Bibasilar atelectasis or scarring. Electronically Signed   By: Rolm Baptise M.D.   On: 09/23/2018 11:45   US Abdomen Complete  Result Date:  09/23/2018 CLINICAL DATA:  Abdominal pain.  Nausea. EXAM: ABDOMEN ULTRASOUND COMPLETE COMPARISON:  CT 09/23/2018. FINDINGS: Gallbladder: Multiple non mobile gallstones noted. Gallbladder wall is thickened at 4 mm. Positive Murphy sign. Cholecystitis could present this fashion. Common bile duct: Diameter: 9 mm. Liver: Intrahepatic biliary ductal dilatation noted. 2.3 cm benign-appearing cys again noted. Increased echogenicity suggesting fatty infiltration. Portal vein is patent on color Doppler imaging with normal direction of blood flow towards the liver. IVC: No abnormality visualized. Pancreas: Visualized portion unremarkable. Spleen: Size and appearance within normal limits. Right Kidney: Length: 10.8 cm. Echogenicity within normal limits. No mass or hydronephrosis visualized. Left Kidney: Length: 10.7 cm. Echogenicity within normal limits. No mass or hydronephrosis visualized. Abdominal aorta: No aneurysm visualized. Other findings: Limited evaluation noted of bowel  gas. IMPRESSION: 1. Multiple non mobile gallstones. Gallbladder wall thickening to 4 mm noted. Positive Murphy sign. Cholecystitis could present this fashion. Dilated common bile duct to 9 mm. Dilated intrahepatic biliary ducts noted. This suggest possibility of common bile duct obstruction. MRCP can be obtained as needed. 2.  Increased echogenicity liver suggesting fatty infiltration. Electronically Signed   By: Marcello Moores  Register   On: 09/23/2018 15:28   Ct Abdomen Pelvis W Contrast  Result Date: 09/23/2018 CLINICAL DATA:  Acute right lower quadrant abdominal pain. EXAM: CT ABDOMEN AND PELVIS WITH CONTRAST TECHNIQUE: Multidetector CT imaging of the abdomen and pelvis was performed using the standard protocol following bolus administration of intravenous contrast. CONTRAST:  121m ISOVUE-300 IOPAMIDOL (ISOVUE-300) INJECTION 61% COMPARISON:  None. FINDINGS: Lower chest: No acute abnormality. Hepatobiliary: Multiple hepatic cysts are noted. Mild  intrahepatic biliary dilatation is noted. Gallbladder is irregular in appearance with surrounding inflammation concerning for cholecystitis. No definite cholelithiasis is noted. Pancreas: Unremarkable. No pancreatic ductal dilatation or surrounding inflammatory changes. Spleen: Normal in size without focal abnormality. Adrenals/Urinary Tract: Adrenal glands are unremarkable. Kidneys are normal, without renal calculi, focal lesion, or hydronephrosis. Bladder is unremarkable. Stomach/Bowel: The stomach appears normal. There is no evidence of bowel obstruction or inflammation. Sigmoid diverticulosis is noted without inflammation. The appendix appears normal. Mild inflammatory changes are noted around the hepatic flexure which may be related to probable cholecystitis. Vascular/Lymphatic: Aortic atherosclerosis. No enlarged abdominal or pelvic lymph nodes. Reproductive: Uterus and bilateral adnexa are unremarkable. Other: No abdominal wall hernia or abnormality. No abdominopelvic ascites. Musculoskeletal: No acute or significant osseous findings. IMPRESSION: Findings concerning for acute cholecystitis, although cholelithiasis is not visualized. There may be secondary inflammation of the adjacent hepatic flexure. Mild intrahepatic and extrahepatic biliary dilatation is noted of unknown etiology. Aortic Atherosclerosis (ICD10-I70.0). Electronically Signed   By: JMarijo Conception M.D.   On: 09/23/2018 14:06   Mr Abdomen Mrcp Wo Contrast  Result Date: 09/23/2018 CLINICAL DATA:  Right-sided abdominal pain for 2 days. Acute cholecystitis. Biliary ductal dilatation on recent ultrasound. EXAM: MRI ABDOMEN WITHOUT CONTRAST  (INCLUDING MRCP) TECHNIQUE: Multiplanar multisequence MR imaging of the abdomen was performed. Heavily T2-weighted images of the biliary and pancreatic ducts were obtained, and three-dimensional MRCP images were rendered by post processing. COMPARISON:  Ultrasound and CT on 09/23/2018 FINDINGS: Lower  chest: No acute findings. Hepatobiliary: Multiple small hepatic cysts are seen. No masses visualized on this unenhanced exam. Numerous small gallstones are seen. Acute pericholecystic inflammatory changes and fluid are seen, consistent with acute cholecystitis. Mild biliary ductal dilatation is seen with common bile duct measuring 8 mm. No definite common bile duct calculi identified. Pancreas: No mass or inflammatory process visualized on this unenhanced exam. No evidence of pancreatic ductal dilatation. Spleen:  Within normal limits in size. Adrenals/Urinary tract: Tiny sub-cm fluid attenuation left renal cyst noted. No evidence of renal mass or hydronephrosis. Stomach/Bowel: Visualized portion unremarkable. Vascular/Lymphatic: No pathologically enlarged lymph nodes identified. No evidence of abdominal aortic aneurysm. Other:  None. Musculoskeletal:  No suspicious bone lesions identified. IMPRESSION: Findings consistent with acute cholecystitis. Mild biliary ductal dilatation, without definite evidence of choledocholithiasis. Electronically Signed   By: JEarle GellM.D.   On: 09/23/2018 20:07   Mr 3d Recon At Scanner  Result Date: 09/23/2018 CLINICAL DATA:  Right-sided abdominal pain for 2 days. Acute cholecystitis. Biliary ductal dilatation on recent ultrasound. EXAM: MRI ABDOMEN WITHOUT CONTRAST  (INCLUDING MRCP) TECHNIQUE: Multiplanar multisequence MR imaging of the abdomen was performed. Heavily T2-weighted images  of the biliary and pancreatic ducts were obtained, and three-dimensional MRCP images were rendered by post processing. COMPARISON:  Ultrasound and CT on 09/23/2018 FINDINGS: Lower chest: No acute findings. Hepatobiliary: Multiple small hepatic cysts are seen. No masses visualized on this unenhanced exam. Numerous small gallstones are seen. Acute pericholecystic inflammatory changes and fluid are seen, consistent with acute cholecystitis. Mild biliary ductal dilatation is seen with common bile  duct measuring 8 mm. No definite common bile duct calculi identified. Pancreas: No mass or inflammatory process visualized on this unenhanced exam. No evidence of pancreatic ductal dilatation. Spleen:  Within normal limits in size. Adrenals/Urinary tract: Tiny sub-cm fluid attenuation left renal cyst noted. No evidence of renal mass or hydronephrosis. Stomach/Bowel: Visualized portion unremarkable. Vascular/Lymphatic: No pathologically enlarged lymph nodes identified. No evidence of abdominal aortic aneurysm. Other:  None. Musculoskeletal:  No suspicious bone lesions identified. IMPRESSION: Findings consistent with acute cholecystitis. Mild biliary ductal dilatation, without definite evidence of choledocholithiasis. Electronically Signed   By: Earle Gell M.D.   On: 09/23/2018 20:07    ROS:  Pertinent items are noted in HPI.  Blood pressure 127/62, pulse (!) 113, temperature 98.9 F (37.2 C), temperature source Oral, resp. rate (!) 34, height _0  (1.651 m), weight 77.6 kg, SpO2 95 %. Physical Exam: Pleasant well-developed well-nourished black female no acute distress Head is normocephalic, atraumatic Eyes without scleral icterus Lungs clear to auscultation with equal breath sounds bilaterally Heart examination reveals a regular rate and rhythm without S3, S4, murmurs Abdomen is soft with tenderness in the right upper quadrant to palpation.  No rigidity is noted.  Difficult to assess hepatosplenomegaly.  Radiologic reports reviewed  Assessment/Plan: Impression: Acute cholecystitis, cholelithiasis.  Hypokalemia has been treated and is resolved. Plan: We will proceed with laparoscopic cholecystectomy today.  The risks and benefits of the procedure including bleeding, infection, hepatobiliary injury, and the possibility of an open procedure were fully explained to the patient, who gave informed consent.  Tamara Santiago 09/24/2018, 7:41 AM

## 2018-09-24 NOTE — Anesthesia Postprocedure Evaluation (Signed)
Anesthesia Post Note  Patient: Tamara Santiago  Procedure(s) Performed: LAPAROSCOPIC CHOLECYSTECTOMY (N/A )  Patient location during evaluation: PACU Anesthesia Type: General Level of consciousness: awake and alert and patient cooperative Pain management: satisfactory to patient Vital Signs Assessment: post-procedure vital signs reviewed and stable Respiratory status: spontaneous breathing Cardiovascular status: stable Postop Assessment: no apparent nausea or vomiting Anesthetic complications: no     Last Vitals:  Vitals:   09/24/18 1015 09/24/18 1030  BP: (!) 124/57 (!) 115/57  Pulse: (!) 111 (!) 110  Resp: (!) 23 18  Temp: 37.1 C   SpO2: 100% 100%    Last Pain:  Vitals:   09/24/18 1030  TempSrc:   PainSc: 6                  Tamara Santiago

## 2018-09-25 ENCOUNTER — Encounter (HOSPITAL_COMMUNITY): Payer: Self-pay | Admitting: General Surgery

## 2018-09-25 LAB — COMPREHENSIVE METABOLIC PANEL WITH GFR
ALT: 17 U/L (ref 0–44)
AST: 21 U/L (ref 15–41)
Albumin: 2.3 g/dL — ABNORMAL LOW (ref 3.5–5.0)
Alkaline Phosphatase: 66 U/L (ref 38–126)
Anion gap: 5 (ref 5–15)
BUN: 13 mg/dL (ref 8–23)
CO2: 26 mmol/L (ref 22–32)
Calcium: 8.1 mg/dL — ABNORMAL LOW (ref 8.9–10.3)
Chloride: 107 mmol/L (ref 98–111)
Creatinine, Ser: 0.97 mg/dL (ref 0.44–1.00)
GFR calc Af Amer: >60 mL/min
GFR calc non Af Amer: 59 mL/min — ABNORMAL LOW
Glucose, Bld: 116 mg/dL — ABNORMAL HIGH (ref 70–99)
Potassium: 4.2 mmol/L (ref 3.5–5.1)
Sodium: 138 mmol/L (ref 135–145)
Total Bilirubin: 1.4 mg/dL — ABNORMAL HIGH (ref 0.3–1.2)
Total Protein: 6.2 g/dL — ABNORMAL LOW (ref 6.5–8.1)

## 2018-09-25 LAB — CBC
HCT: 33.8 % — ABNORMAL LOW (ref 36.0–46.0)
Hemoglobin: 10.7 g/dL — ABNORMAL LOW (ref 12.0–15.0)
MCH: 29.6 pg (ref 26.0–34.0)
MCHC: 31.7 g/dL (ref 30.0–36.0)
MCV: 93.4 fL (ref 80.0–100.0)
Platelets: 297 10*3/uL (ref 150–400)
RBC: 3.62 MIL/uL — ABNORMAL LOW (ref 3.87–5.11)
RDW: 14.3 % (ref 11.5–15.5)
WBC: 17.5 10*3/uL — ABNORMAL HIGH (ref 4.0–10.5)
nRBC: 0 % (ref 0.0–0.2)

## 2018-09-25 LAB — MAGNESIUM: Magnesium: 2 mg/dL (ref 1.7–2.4)

## 2018-09-25 LAB — PHOSPHORUS: Phosphorus: 2.2 mg/dL — ABNORMAL LOW (ref 2.5–4.6)

## 2018-09-25 MED ORDER — SODIUM PHOSPHATES 45 MMOLE/15ML IV SOLN
20.0000 mmol | Freq: Once | INTRAVENOUS | Status: AC
  Administered 2018-09-25: 20 mmol via INTRAVENOUS
  Filled 2018-09-25: qty 6.67

## 2018-09-25 NOTE — Plan of Care (Signed)
  Problem: Acute Rehab PT Goals(only PT should resolve) Goal: Pt will Roll Supine to Side Outcome: Progressing Flowsheets (Taken 09/25/2018 0905) Pt will Roll Supine to Side: with supervision Goal: Pt Will Go Supine/Side To Sit Outcome: Progressing Flowsheets (Taken 09/25/2018 0905) Pt will go Supine/Side to Sit: with min guard assist Goal: Patient Will Transfer Sit To/From Stand Outcome: Progressing Flowsheets (Taken 09/25/2018 0905) Patient will transfer sit to/from stand: with supervision Goal: Pt Will Transfer Bed To Chair/Chair To Bed Outcome: Progressing Flowsheets (Taken 09/25/2018 0905) Pt will Transfer Bed to Chair/Chair to Bed: with supervision Goal: Pt Will Ambulate Outcome: Progressing Flowsheets (Taken 09/25/2018 0905) Pt will Ambulate: 75 feet; with rolling walker; with supervision   9:06 AM, 09/25/18 Ocie BobJames Reagan Klemz, MPT Physical Therapist with [facility] 336 [phone] office [phone] mobile phone

## 2018-09-25 NOTE — Progress Notes (Signed)
MEDICATION RELATED CONSULT NOTE - INITIAL   Pharmacy Consult for IV Phosphate replacement Indication:  Hypophosphatemia  No Known Allergies  Patient Measurements: Height: 5\' 5"  (165.1 cm) Weight: 171 lb (77.6 kg) IBW/kg (Calculated) : 57  Vital Signs: Temp: 99 F (37.2 C) (11/21 0400) Temp Source: Oral (11/21 0400) BP: 109/56 (11/21 0601) Pulse Rate: 98 (11/21 0601) Intake/Output from previous day: 11/20 0701 - 11/21 0700 In: 989.5 [I.V.:868.7; IV Piggyback:120.8] Out: 0  Intake/Output from this shift: No intake/output data recorded.  Labs: Recent Labs    09/23/18 1159 09/23/18 1749 09/24/18 0525 09/25/18 0430  WBC 17.4*  --  17.5* 17.5*  HGB 13.2  --  12.0 10.7*  HCT 39.2  --  37.1 33.8*  PLT 355  --  301 297  APTT  --  32  --   --   CREATININE 0.98  --  0.94 0.97  MG  --  1.8  --  2.0  PHOS  --   --   --  2.2*  ALBUMIN 3.2*  --  2.7* 2.3*  PROT 8.0  --  6.8 6.2*  AST 15  --  14* 21  ALT 12  --  13 17  ALKPHOS 73  --  68 66  BILITOT 1.2  --  1.5* 1.4*  BILIDIR 0.3*  --   --   --   IBILI 0.9  --   --   --    Estimated Creatinine Clearance: 57.1 mL/min (by C-G formula based on SCr of 0.97 mg/dL).   Microbiology: Recent Results (from the past 720 hour(s))  Culture, blood (x 2)     Status: None (Preliminary result)   Collection Time: 09/23/18  5:54 PM  Result Value Ref Range Status   Specimen Description RIGHT ANTECUBITAL  Final   Special Requests   Final    BOTTLES DRAWN AEROBIC AND ANAEROBIC Blood Culture adequate volume   Culture   Final    NO GROWTH 2 DAYS Performed at [facility], [address] [place], [place] [address]    Report Status PENDING  Incomplete  Culture, blood (x 2)     Status: None (Preliminary result)   Collection Time: 09/23/18  6:03 PM  Result Value Ref Range Status   Specimen Description BLOOD RIGHT HAND  Final   Special Requests   Final    BOTTLES DRAWN AEROBIC AND ANAEROBIC Blood Culture adequate volume   Culture   Final     NO GROWTH 2 DAYS Performed at [facility], [address] [place], [place] [address]    Report Status PENDING  Incomplete  Surgical PCR screen     Status: Abnormal   Collection Time: 09/23/18  9:13 PM  Result Value Ref Range Status   MRSA, PCR NEGATIVE NEGATIVE Final   Staphylococcus aureus POSITIVE (A) NEGATIVE Final    Comment: RESULT CALLED TO, READ BACK BY AND VERIFIED WITH: C HOPKINS,RN @0041  09/24/18 MKELLY (NOTE) The Xpert SA Assay (FDA approved for NASAL specimens in patients 68 years of age and older), is one component of a comprehensive surveillance program. It is not intended to diagnose infection nor to guide or monitor treatment. Performed at [facility], [address] [place], [place] [address]     Medical History: Past Medical History:  Diagnosis Date  . Acid reflux   . Hypercholesteremia   . Hypertension     Medications:  See med rec  Assessment: 67 yo female who is post op D#1 after lap  chole. Pharmacy consulted for replacement of low phosphate.   Goal of Therapy:  Phosphate 2.5-4.6  Plan:  Na Phosphate IV x 1 F/U labs  Elder Cyphers, BS Loura Back, BCPS Clinical Pharmacist Pager [phone] 09/25/2018,8:37 AM

## 2018-09-25 NOTE — Care Management Note (Signed)
Case Management Note  Patient Details  Name: Enrigue CatenaJoslane Yingst MRN: [id number] Date of Birth: 02/21/1951  Subjective/Objective:         S/p cholecystectomy. From home, lives alone, will have daughter to stay with her after DC. PT recommends HH and RW. Pt agreeable and [facility] .            Action/Plan: DC home with HH PT and RW. Bonita QuinLinda, [facility], given referral and will pull pt info from chart. Will deliver DME to pt room prior to DC. CM will [facility].  Expected Discharge Date:       09/26/2018           Expected Discharge Plan:  Home w Home Health Services  In-House Referral:  NA  Discharge planning Services  CM Consult  Post Acute Care Choice:  Durable Medical Equipment, Home Health Choice offered to:  Patient  DME Arranged:  Walker rolling DME Agency:  [facility]  [facility]  [facility]  Status of Service:  Completed, signed off  If discussed at Long Length of Stay Meetings, dates discussed:    Additional Comments:  Malcolm MetroChildress, Bobbye Petti Demske, RN 09/25/2018, 1:48 PM

## 2018-09-25 NOTE — Progress Notes (Signed)
1 Day Post-Op  Subjective: Patient states that she was somewhat disoriented last night.  She is oriented and alert this morning.  Mild incisional pain.  No nausea or vomiting noted.  Objective: Vital signs in last 24 hours: Temp:  [97.9 F (36.6 C)-99 F (37.2 C)] 99 F (37.2 C) (11/21 0400) Pulse Rate:  [76-117] 98 (11/21 0601) Resp:  [15-32] 32 (11/21 0601) BP: (86-127)/(51-111) 109/56 (11/21 0601) SpO2:  [91 %-100 %] 100 % (11/21 0601) Last BM Date: 09/21/18  Intake/Output from previous day: 11/20 0701 - 11/21 0700 In: 989.5 [I.V.:868.7; IV Piggyback:120.8] Out: 0  Intake/Output this shift: No intake/output data recorded.  General appearance: alert, cooperative and no distress Resp: clear to auscultation bilaterally Cardio: regular rate and rhythm, S1, S2 normal, no murmur, click, rub or gallop GI: Soft, dressing is dry and intact.  Lab Results:  Recent Labs    09/24/18 0525 09/25/18 0430  WBC 17.5* 17.5*  HGB 12.0 10.7*  HCT 37.1 33.8*  PLT 301 297   BMET Recent Labs    09/24/18 0525 09/25/18 0430  NA 135 138  K 3.7 4.2  CL 104 107  CO2 24 26  GLUCOSE 107* 116*  BUN 9 13  CREATININE 0.94 0.97  CALCIUM 8.0* 8.1*   PT/INR Recent Labs    09/23/18 1749  LABPROT 15.6*  INR 1.25    Studies/Results: Dg Chest 2 View  Result Date: 09/23/2018 CLINICAL DATA:  Weakness, chest pain, right lower quadrant pain. EXAM: CHEST - 2 VIEW COMPARISON:  None. FINDINGS: Cardiomegaly. Bibasilar atelectasis or scarring. No effusions. No acute bony abnormality. IMPRESSION: Cardiomegaly.  Bibasilar atelectasis or scarring. Electronically Signed   By: Kevin  Dover M.D.   On: 09/23/2018 11:45   Us Abdomen Complete  Result Date: 09/23/2018 CLINICAL DATA:  Abdominal pain.  Nausea. EXAM: ABDOMEN ULTRASOUND COMPLETE COMPARISON:  CT 09/23/2018. FINDINGS: Gallbladder: Multiple non mobile gallstones noted. Gallbladder wall is thickened at 4 mm. Positive Murphy sign. Cholecystitis  could present this fashion. Common bile duct: Diameter: 9 mm. Liver: Intrahepatic biliary ductal dilatation noted. 2.3 cm benign-appearing cys again noted. Increased echogenicity suggesting fatty infiltration. Portal vein is patent on color Doppler imaging with normal direction of blood flow towards the liver. IVC: No abnormality visualized. Pancreas: Visualized portion unremarkable. Spleen: Size and appearance within normal limits. Right Kidney: Length: 10.8 cm. Echogenicity within normal limits. No mass or hydronephrosis visualized. Left Kidney: Length: 10.7 cm. Echogenicity within normal limits. No mass or hydronephrosis visualized. Abdominal aorta: No aneurysm visualized. Other findings: Limited evaluation noted of bowel gas. IMPRESSION: 1. Multiple non mobile gallstones. Gallbladder wall thickening to 4 mm noted. Positive Murphy sign. Cholecystitis could present this fashion. Dilated common bile duct to 9 mm. Dilated intrahepatic biliary ducts noted. This suggest possibility of common bile duct obstruction. MRCP can be obtained as needed. 2.  Increased echogenicity liver suggesting fatty infiltration. Electronically Signed   By: Thomas  Register   On: 09/23/2018 15:28   Ct Abdomen Pelvis W Contrast  Result Date: 09/23/2018 CLINICAL DATA:  Acute right lower quadrant abdominal pain. EXAM: CT ABDOMEN AND PELVIS WITH CONTRAST TECHNIQUE: Multidetector CT imaging of the abdomen and pelvis was performed using the standard protocol following bolus administration of intravenous contrast. CONTRAST:  <MEASU<MEASUREMEN<MEASURE NTWilkie AyeOPAMIDOL (ISOVUE-300) INJECTION 61% COMPARISON:  None. FINDINGS: Lower chest: No acute abnormality. Hepatobiliary: Multiple hepatic cysts are noted. Mild intrahepatic biliary dilatation is noted. Gallbladder is irregular in appearance with surrounding inflammation concerning for cholecystitis. No definite cholelithiasis is  noted. Pancreas: Unremarkable. No pancreatic ductal dilatation or surrounding  inflammatory changes. Spleen: Normal in size without focal abnormality. Adrenals/Urinary Tract: Adrenal glands are unremarkable. Kidneys are normal, without renal calculi, focal lesion, or hydronephrosis. Bladder is unremarkable. Stomach/Bowel: The stomach appears normal. There is no evidence of bowel obstruction or inflammation. Sigmoid diverticulosis is noted without inflammation. The appendix appears normal. Mild inflammatory changes are noted around the hepatic flexure which may be related to probable cholecystitis. Vascular/Lymphatic: Aortic atherosclerosis. No enlarged abdominal or pelvic lymph nodes. Reproductive: Uterus and bilateral adnexa are unremarkable. Other: No abdominal wall hernia or abnormality. No abdominopelvic ascites. Musculoskeletal: No acute or significant osseous findings. IMPRESSION: Findings concerning for acute cholecystitis, although cholelithiasis is not visualized. There may be secondary inflammation of the adjacent hepatic flexure. Mild intrahepatic and extrahepatic biliary dilatation is noted of unknown etiology. Aortic Atherosclerosis (ICD10-I70.0). Electronically Signed   By: Lupita Raider, M.D.   On: 09/23/2018 14:06   Mr Abdomen Mrcp Wo Contrast  Result Date: 09/23/2018 CLINICAL DATA:  Right-sided abdominal pain for 2 days. Acute cholecystitis. Biliary ductal dilatation on recent ultrasound. EXAM: MRI ABDOMEN WITHOUT CONTRAST  (INCLUDING MRCP) TECHNIQUE: Multiplanar multisequence MR imaging of the abdomen was performed. Heavily T2-weighted images of the biliary and pancreatic ducts were obtained, and three-dimensional MRCP images were rendered by post processing. COMPARISON:  Ultrasound and CT on 09/23/2018 FINDINGS: Lower chest: No acute findings. Hepatobiliary: Multiple small hepatic cysts are seen. No masses visualized on this unenhanced exam. Numerous small gallstones are seen. Acute pericholecystic inflammatory changes and fluid are seen, consistent with acute  cholecystitis. Mild biliary ductal dilatation is seen with common bile duct measuring 8 mm. No definite common bile duct calculi identified. Pancreas: No mass or inflammatory process visualized on this unenhanced exam. No evidence of pancreatic ductal dilatation. Spleen:  Within normal limits in size. Adrenals/Urinary tract: Tiny sub-cm fluid attenuation left renal cyst noted. No evidence of renal mass or hydronephrosis. Stomach/Bowel: Visualized portion unremarkable. Vascular/Lymphatic: No pathologically enlarged lymph nodes identified. No evidence of abdominal aortic aneurysm. Other:  None. Musculoskeletal:  No suspicious bone lesions identified. IMPRESSION: Findings consistent with acute cholecystitis. Mild biliary ductal dilatation, without definite evidence of choledocholithiasis. Electronically Signed   By: Myles Rosenthal M.D.   On: 09/23/2018 20:07   Mr 3d Recon At Scanner  Result Date: 09/23/2018 CLINICAL DATA:  Right-sided abdominal pain for 2 days. Acute cholecystitis. Biliary ductal dilatation on recent ultrasound. EXAM: MRI ABDOMEN WITHOUT CONTRAST  (INCLUDING MRCP) TECHNIQUE: Multiplanar multisequence MR imaging of the abdomen was performed. Heavily T2-weighted images of the biliary and pancreatic ducts were obtained, and three-dimensional MRCP images were rendered by post processing. COMPARISON:  Ultrasound and CT on 09/23/2018 FINDINGS: Lower chest: No acute findings. Hepatobiliary: Multiple small hepatic cysts are seen. No masses visualized on this unenhanced exam. Numerous small gallstones are seen. Acute pericholecystic inflammatory changes and fluid are seen, consistent with acute cholecystitis. Mild biliary ductal dilatation is seen with common bile duct measuring 8 mm. No definite common bile duct calculi identified. Pancreas: No mass or inflammatory process visualized on this unenhanced exam. No evidence of pancreatic ductal dilatation. Spleen:  Within normal limits in size. Adrenals/Urinary  tract: Tiny sub-cm fluid attenuation left renal cyst noted. No evidence of renal mass or hydronephrosis. Stomach/Bowel: Visualized portion unremarkable. Vascular/Lymphatic: No pathologically enlarged lymph nodes identified. No evidence of abdominal aortic aneurysm. Other:  None. Musculoskeletal:  No suspicious bone lesions identified. IMPRESSION: Findings consistent with acute cholecystitis. Mild biliary ductal dilatation, without  definite evidence of choledocholithiasis. Electronically Signed   By: Myles Rosenthal M.D.   On: 09/23/2018 20:07    Anti-infectives: Anti-infectives (From admission, onward)   Start     Dose/Rate Route Frequency Ordered Stop   09/24/18 0600  piperacillin-tazobactam (ZOSYN) IVPB 3.375 g     3.375 g 12.5 mL/hr over 240 Minutes Intravenous Every 8 hours 09/23/18 2352     09/24/18 0400  piperacillin-tazobactam (ZOSYN) IVPB 3.375 g  Status:  Discontinued     3.375 g 12.5 mL/hr over 240 Minutes Intravenous Every 8 hours 09/23/18 2050 09/23/18 2352   09/23/18 2200  piperacillin-tazobactam (ZOSYN) IVPB 3.375 g  Status:  Discontinued     3.375 g 12.5 mL/hr over 240 Minutes Intravenous Every 8 hours 09/23/18 1820 09/23/18 2050   09/23/18 2100  piperacillin-tazobactam (ZOSYN) IVPB 3.375 g    Note to Pharmacy:  ED dose never given   3.375 g 100 mL/hr over 30 Minutes Intravenous  Once 09/23/18 2048 09/23/18 2328   09/23/18 1430  piperacillin-tazobactam (ZOSYN) IVPB 3.375 g  Status:  Discontinued     3.375 g 100 mL/hr over 30 Minutes Intravenous  Once 09/23/18 1426 09/23/18 2048      Assessment/Plan: s/p Procedure(s): LAPAROSCOPIC CHOLECYSTECTOMY Impression: Stable on postoperative day 1.  Does have hypophosphatemia which will be addressed.  Her leukocytosis is still present, but not surprising given her operative findings. Plan: We will get PT consultation.  Continue IV Zosyn.  LOS: 2 days    Franky Macho 09/25/2018

## 2018-09-25 NOTE — Evaluation (Signed)
Physical Therapy Evaluation Patient Details Name: Tamara Santiago MRN: [id number] DOB: 09/24/1951 Today's Date: 09/25/2018   History of Present Illness  Tamara Santiago is a 67 y.o. female s/p Laparoscopic cholecystectomy, 09/24/18 with medical history significant of hypertension, hypercholesterolemia, GERD, presents to the hospital with complaints of abdominal pain.  Symptoms started approximately 2 days ago.  She has associated nausea, but no vomiting.  Last bowel movement was 2 days ago.  She took Metamucil today without any bowel movement.  She has felt feverish.  Pain is mostly located in the epigastric and right upper quadrant, but does radiate down her right abdomen.  She does not have any cough, shortness of breath.  She is dizzy on standing.  She has had minimal p.o. intake in the past 2 days.    Clinical Impression  Patient had difficulty rolling and sitting up from side lying position with head of bed flat due to increased abdominal pain, required assistance for sit to stands and lean backwards once standing due to BLE weakness, required use of RW for safety and able to ambulate in hallway demonstrating slow labored cadence without loss of balance.  Patient tolerated sitting up in chair after therapy.  Patient states her daughters will stay with her after discharge and does not want to go to a SNF.  Patient will benefit from continued physical therapy in hospital and recommended venue below to increase strength, balance, endurance for safe ADLs and gait.    Follow Up Recommendations Home health PT;Supervision for mobility/OOB;Supervision - Intermittent    Equipment Recommendations  Rolling walker with 5" wheels    Recommendations for Other Services       Precautions / Restrictions Precautions Precautions: Fall Precaution Comments: s/p Laparoscopic cholecystectomy Restrictions Weight Bearing Restrictions: No      Mobility  Bed Mobility Overal bed mobility: Needs Assistance Bed  Mobility: Rolling;Sit to Sidelying Rolling: Min assist       Sit to sidelying: Min assist General bed mobility comments: slow labored movement with increased abdominal pain  Transfers Overall transfer level: Needs assistance Equipment used: None;Rolling walker (2 wheeled) Transfers: Sit to/from [employer]Stand;Stand Pivot Transfers Sit to Stand: Min assist Stand pivot transfers: Min guard;Min assist       General transfer comment: very unsteady with falling backwards without AD, safer using RW  Ambulation/Gait Ambulation/Gait assistance: Min assist;Min guard Gait Distance (Feet): 50 Feet Assistive device: Rolling walker (2 wheeled) Gait Pattern/deviations: Decreased step length - right;Decreased step length - left;Decreased stride length Gait velocity: decreased   General Gait Details: demonstrates slow labored unsteady cadence without loss of balance using RW, frequent near falls when not using AD  Stairs            Wheelchair Mobility    Modified Rankin (Stroke Patients Only)       Balance Overall balance assessment: Needs assistance Sitting-balance support: Feet supported;No upper extremity supported Sitting balance-Leahy Scale: Good     Standing balance support: During functional activity;No upper extremity supported Standing balance-Leahy Scale: Poor Standing balance comment: fair using RW                             Pertinent Vitals/Pain Pain Assessment: 0-10 Pain Score: 8  Pain Location: abdomen at site of surgery with movement Pain Descriptors / Indicators: Sore;Discomfort;Grimacing Pain Intervention(s): Limited activity within patient's tolerance;Monitored during session    Home Living Family/patient expects to be discharged to:: Private residence Living Arrangements: Alone Available Help  at Discharge: Family Type of Home: House Home Access: Stairs to enter Entrance Stairs-Rails: None Entrance Stairs-Number of Steps: 1 Home Layout:  Multi-level;Laundry or work area in [employer] Equipment: Gilmer Mor - single point      Prior Function Level of Independence: Independent         Comments: community ambulator, drives     Higher education careers adviser        Extremity/Trunk Assessment   Upper Extremity Assessment Upper Extremity Assessment: Generalized weakness    Lower Extremity Assessment Lower Extremity Assessment: Generalized weakness    Cervical / Trunk Assessment Cervical / Trunk Assessment: Normal  Communication   Communication: No difficulties  Cognition Arousal/Alertness: Awake/alert Behavior During Therapy: WFL for tasks assessed/performed Overall Cognitive Status: Within Functional Limits for tasks assessed                                        General Comments      Exercises     Assessment/Plan    PT Assessment Patient needs continued PT services  PT Problem List Decreased strength;Decreased activity tolerance;Decreased balance;Decreased mobility       PT Treatment Interventions Gait training;Stair training;Functional mobility training;Therapeutic activities;Therapeutic exercise;Patient/family education    PT Goals (Current goals can be found in the Care Plan section)  Acute Rehab PT Goals Patient Stated Goal: return home with her daughters to assist PT Goal Formulation: With patient Time For Goal Achievement: 10/02/18 Potential to Achieve Goals: Good    Frequency Min 5X/week   Barriers to discharge        Co-evaluation               AM-PAC PT "6 Clicks" Daily Activity  Outcome Measure Difficulty turning over in bed (including adjusting bedclothes, sheets and blankets)?: Unable Difficulty moving from lying on back to sitting on the side of the bed? : Unable Difficulty sitting down on and standing up from a chair with arms (e.g., wheelchair, bedside commode, etc,.)?: Unable Help needed moving to and from a bed to chair (including a wheelchair)?: A Little Help  needed walking in hospital room?: A Little Help needed climbing 3-5 steps with a railing? : A Lot 6 Click Score: 11    End of Session Equipment Utilized During Treatment: Gait belt Activity Tolerance: Patient tolerated treatment well;Patient limited by fatigue;Patient limited by pain Patient left: in chair;with call bell/phone within reach Nurse Communication: Mobility status PT Visit Diagnosis: Unsteadiness on feet (R26.81);Other abnormalities of gait and mobility (R26.89);Muscle weakness (generalized) (M62.81)    Time: 1191-4782 PT Time Calculation (min) (ACUTE ONLY): 33 min   Charges:   PT Evaluation $PT Eval Moderate Complexity: 1 Mod PT Treatments $Therapeutic Activity: 23-37 mins        9:03 AM, 09/25/18 Ocie Bob, MPT Physical Therapist with [facility] [phone] office [phone] mobile phone

## 2018-09-26 LAB — CBC
HCT: 32.2 % — ABNORMAL LOW (ref 36.0–46.0)
Hemoglobin: 10.2 g/dL — ABNORMAL LOW (ref 12.0–15.0)
MCH: 29.7 pg (ref 26.0–34.0)
MCHC: 31.7 g/dL (ref 30.0–36.0)
MCV: 93.9 fL (ref 80.0–100.0)
Platelets: 335 10*3/uL (ref 150–400)
RBC: 3.43 MIL/uL — ABNORMAL LOW (ref 3.87–5.11)
RDW: 14.2 % (ref 11.5–15.5)
WBC: 16.5 10*3/uL — ABNORMAL HIGH (ref 4.0–10.5)
nRBC: 0 % (ref 0.0–0.2)

## 2018-09-26 LAB — COMPREHENSIVE METABOLIC PANEL
ALT: 17 U/L (ref 0–44)
AST: 13 U/L — ABNORMAL LOW (ref 15–41)
Albumin: 2.1 g/dL — ABNORMAL LOW (ref 3.5–5.0)
Alkaline Phosphatase: 66 U/L (ref 38–126)
Anion gap: 7 (ref 5–15)
BUN: 8 mg/dL (ref 8–23)
CO2: 26 mmol/L (ref 22–32)
Calcium: 8 mg/dL — ABNORMAL LOW (ref 8.9–10.3)
Chloride: 104 mmol/L (ref 98–111)
Creatinine, Ser: 0.86 mg/dL (ref 0.44–1.00)
GFR calc Af Amer: >60 mL/min (ref >60)
GFR calc non Af Amer: >60 mL/min (ref >60)
Glucose, Bld: 102 mg/dL — ABNORMAL HIGH (ref 70–99)
Potassium: 3.6 mmol/L (ref 3.5–5.1)
Sodium: 137 mmol/L (ref 135–145)
Total Bilirubin: 1 mg/dL (ref 0.3–1.2)
Total Protein: 6.1 g/dL — ABNORMAL LOW (ref 6.5–8.1)

## 2018-09-26 LAB — MAGNESIUM: Magnesium: 1.9 mg/dL (ref 1.7–2.4)

## 2018-09-26 LAB — PHOSPHORUS: Phosphorus: 2.6 mg/dL (ref 2.5–4.6)

## 2018-09-26 MED ORDER — POTASSIUM PHOSPHATES 15 MMOLE/5ML IV SOLN
10.0000 mmol | Freq: Once | INTRAVENOUS | Status: AC
  Administered 2018-09-26: 10 mmol via INTRAVENOUS
  Filled 2018-09-26: qty 3.33

## 2018-09-26 MED ORDER — MAGNESIUM HYDROXIDE 400 MG/5ML PO SUSP
30.0000 mL | Freq: Two times a day (BID) | ORAL | Status: DC
  Administered 2018-09-26 – 2018-09-28 (×5): 30 mL via ORAL
  Filled 2018-09-26 (×5): qty 30

## 2018-09-26 MED ORDER — KCL IN DEXTROSE-NACL 20-5-0.45 MEQ/L-%-% IV SOLN
INTRAVENOUS | Status: DC
  Administered 2018-09-26: 09:00:00 via INTRAVENOUS

## 2018-09-26 NOTE — Progress Notes (Signed)
2 Days Post-Op  Subjective: Patient alert and oriented.  Denies any significant abdominal pain.  No bowel movement yet.  Objective: Vital signs in last 24 hours: Temp:  [98.7 F (37.1 C)-99.2 F (37.3 C)] 99.2 F (37.3 C) (11/22 0511) Pulse Rate:  [100-109] 109 (11/22 0511) Resp:  [18] 18 (11/21 1237) BP: (97-118)/(50-61) 118/58 (11/22 0511) SpO2:  [91 %-96 %] 96 % (11/22 0511) Last BM Date: 09/21/18  Intake/Output from previous day: 11/21 0701 - 11/22 0700 In: 2051.2 [P.O.:480; I.V.:1379.4; IV Piggyback:191.8] Out: -  Intake/Output this shift: No intake/output data recorded.  General appearance: alert, cooperative and no distress Resp: clear to auscultation bilaterally Cardio: regular rate and rhythm, S1, S2 normal, no murmur, click, rub or gallop GI: Soft, dressing is dry and intact.  Minimal bowel sounds appreciated.  Lab Results:  Recent Labs    09/25/18 0430 09/26/18 0523  WBC 17.5* 16.5*  HGB 10.7* 10.2*  HCT 33.8* 32.2*  PLT 297 335   BMET Recent Labs    09/25/18 0430 09/26/18 0523  NA 138 137  K 4.2 3.6  CL 107 104  CO2 26 26  GLUCOSE 116* 102*  BUN 13 8  CREATININE 0.97 0.86  CALCIUM 8.1* 8.0*   PT/INR Recent Labs    09/23/18 1749  LABPROT 15.6*  INR 1.25    Studies/Results: No results found.  Anti-infectives: Anti-infectives (From admission, onward)   Start     Dose/Rate Route Frequency Ordered Stop   09/24/18 0600  piperacillin-tazobactam (ZOSYN) IVPB 3.375 g     3.375 g 12.5 mL/hr over 240 Minutes Intravenous Every 8 hours 09/23/18 2352     09/24/18 0400  piperacillin-tazobactam (ZOSYN) IVPB 3.375 g  Status:  Discontinued     3.375 g 12.5 mL/hr over 240 Minutes Intravenous Every 8 hours 09/23/18 2050 09/23/18 2352   09/23/18 2200  piperacillin-tazobactam (ZOSYN) IVPB 3.375 g  Status:  Discontinued     3.375 g 12.5 mL/hr over 240 Minutes Intravenous Every 8 hours 09/23/18 1820 09/23/18 2050   09/23/18 2100  piperacillin-tazobactam  (ZOSYN) IVPB 3.375 g    Note to Pharmacy:  ED dose never given   3.375 g 100 mL/hr over 30 Minutes Intravenous  Once 09/23/18 2048 09/23/18 2328   09/23/18 1430  piperacillin-tazobactam (ZOSYN) IVPB 3.375 g  Status:  Discontinued     3.375 g 100 mL/hr over 30 Minutes Intravenous  Once 09/23/18 1426 09/23/18 2048      Assessment/Plan: s/p Procedure(s): LAPAROSCOPIC CHOLECYSTECTOMY Impression: Stable on postoperative day 2.  Slight decrease in leukocytosis.  Bowel function is not fully returned yet.  Appreciate PT evaluation.  Will arrange for home PT.  Adjust IV fluids.  Hypophosphatemia resolving.  LOS: 3 days    Tamara Santiago 09/26/2018

## 2018-09-26 NOTE — Progress Notes (Signed)
Physical Therapy Treatment Patient Details Name: Tamara Santiago MRN: [id number] DOB: 05-Nov-1951 Today's Date: 09/26/2018    History of Present Illness Tamara Santiago is a 67 y.o. female s/p Laparoscopic cholecystectomy, 09/24/18 with medical history significant of hypertension, hypercholesterolemia, GERD, presents to the hospital with complaints of abdominal pain.  Symptoms started approximately 2 days ago.  She has associated nausea, but no vomiting.  Last bowel movement was 2 days ago.  She took Metamucil today without any bowel movement.  She has felt feverish.  Pain is mostly located in the epigastric and right upper quadrant, but does radiate down her right abdomen.  She does not have any cough, shortness of breath.  She is dizzy on standing.  She has had minimal p.o. intake in the past 2 days.    PT Comments    Patient demonstrates increased endurance/distance for ambulation in hallway without loss of balance.  Patient and her daughter instructed in proper body mechanics for sit to stands and log rolling technique for getting into/out of bed with good return demonstrated and understanding acknowledged.  Patient requested to stay in bed due to fatigue after having sat up for approximately 2 hours.  Patient will benefit from continued physical therapy in hospital and recommended venue below to increase strength, balance, endurance for safe ADLs and gait.    Follow Up Recommendations  Home health PT;Supervision for mobility/OOB;Supervision - Intermittent     Equipment Recommendations  Rolling walker with 5" wheels    Recommendations for Other Services       Precautions / Restrictions Precautions Precautions: Fall Precaution Comments: s/p Laparoscopic cholecystectomy Restrictions Weight Bearing Restrictions: No    Mobility  Bed Mobility Overal bed mobility: Needs Assistance Bed Mobility: Rolling;Sit to Sidelying;Sidelying to Sit Rolling: Min guard Sidelying to sit: Min guard     Sit to sidelying: Min guard General bed mobility comments: required verbal/tactile cueing for proper log rolling technique with fair/good return demonstrated (patient's daughter present)  Transfers Overall transfer level: Needs assistance Equipment used: Rolling walker (2 wheeled) Transfers: Sit to/from [employer] Sit to Stand: Min guard Stand pivot transfers: Supervision       General transfer comment: verbal cues for proper hand placement and body mechanics during sit to stands with fair/good carryover  Ambulation/Gait Ambulation/Gait assistance: Supervision Gait Distance (Feet): 75 Feet Assistive device: Rolling walker (2 wheeled) Gait Pattern/deviations: Decreased step length - right;Decreased step length - left;Decreased stride length Gait velocity: decreased   General Gait Details: increased endurance/distance for ambulation demonstrating slightly labored cadence without loss of balance, limited secondary to fatigue and right sided abdominal pain   Stairs             Wheelchair Mobility    Modified Rankin (Stroke Patients Only)       Balance Overall balance assessment: Needs assistance Sitting-balance support: No upper extremity supported;Feet supported Sitting balance-Leahy Scale: Good     Standing balance support: Bilateral upper extremity supported;During functional activity Standing balance-Leahy Scale: Fair Standing balance comment: fair using RW                            Cognition Arousal/Alertness: Awake/alert Behavior During Therapy: WFL for tasks assessed/performed Overall Cognitive Status: Within Functional Limits for tasks assessed  Exercises      General Comments        Pertinent Vitals/Pain Pain Assessment: Faces Faces Pain Scale: Hurts little more Pain Location: abdomen at site of surgery with movement Pain Descriptors / Indicators:  Sore;Discomfort;Grimacing Pain Intervention(s): Limited activity within patient's tolerance;Monitored during session    Home Living                      Prior Function            PT Goals (current goals can now be found in the care plan section) Acute Rehab PT Goals Patient Stated Goal: return home with her daughters to assist PT Goal Formulation: With patient Time For Goal Achievement: 10/02/18 Potential to Achieve Goals: Good Progress towards PT goals: Progressing toward goals    Frequency    Min 5X/week      PT Plan Current plan remains appropriate    Co-evaluation              AM-PAC PT "6 Clicks" Daily Activity  Outcome Measure  Difficulty turning over in bed (including adjusting bedclothes, sheets and blankets)?: Unable Difficulty moving from lying on back to sitting on the side of the bed? : Unable Difficulty sitting down on and standing up from a chair with arms (e.g., wheelchair, bedside commode, etc,.)?: A Little Help needed moving to and from a bed to chair (including a wheelchair)?: A Little Help needed walking in hospital room?: A Little Help needed climbing 3-5 steps with a railing? : A Little 6 Click Score: 14    End of Session   Activity Tolerance: Patient tolerated treatment well;Patient limited by fatigue;Patient limited by pain Patient left: in bed;with call bell/phone within reach;with family/visitor present Nurse Communication: Mobility status PT Visit Diagnosis: Unsteadiness on feet (R26.81);Other abnormalities of gait and mobility (R26.89);Muscle weakness (generalized) (M62.81)     Time: [id number] PT Time Calculation (min) (ACUTE ONLY): 22 min  Charges:  $Therapeutic Activity: 8-22 mins                     10:32 AM, 09/26/18 Ocie BobJames Brelynn Wheller, MPT Physical Therapist with [facility] 336 [phone] office [phone] mobile phone

## 2018-09-26 NOTE — Progress Notes (Signed)
MEDICATION RELATED CONSULT NOTE - INITIAL   Pharmacy Consult for IV Phosphate replacement Indication:  Hypophosphatemia  No Known Allergies  Patient Measurements: Height: 5\' 5"  (165.1 cm) Weight: 171 lb (77.6 kg) IBW/kg (Calculated) : 57  Vital Signs: Temp: 99.2 F (37.3 C) (11/22 0511) Temp Source: Oral (11/22 0511) BP: 118/58 (11/22 0511) Pulse Rate: 109 (11/22 0511) Intake/Output from previous day: 11/21 0701 - 11/22 0700 In: 2051.2 [P.O.:480; I.V.:1379.4; IV Piggyback:191.8] Out: -  Intake/Output from this shift: Total I/O In: 240 [P.O.:240] Out: -   Labs: Recent Labs    09/23/18 1749 09/24/18 0525 09/25/18 0430 09/26/18 0523  WBC  --  17.5* 17.5* 16.5*  HGB  --  12.0 10.7* 10.2*  HCT  --  37.1 33.8* 32.2*  PLT  --  301 297 335  APTT 32  --   --   --   CREATININE  --  0.94 0.97 0.86  MG 1.8  --  2.0 1.9  PHOS  --   --  2.2* 2.6  ALBUMIN  --  2.7* 2.3* 2.1*  PROT  --  6.8 6.2* 6.1*  AST  --  14* 21 13*  ALT  --  13 17 17   ALKPHOS  --  68 66 66  BILITOT  --  1.5* 1.4* 1.0   Estimated Creatinine Clearance: 64.4 mL/min (by C-G formula based on SCr of 0.86 mg/dL).   Microbiology: Recent Results (from the past 720 hour(s))  Culture, blood (x 2)     Status: None (Preliminary result)   Collection Time: 09/23/18  5:54 PM  Result Value Ref Range Status   Specimen Description RIGHT ANTECUBITAL  Final   Special Requests   Final    BOTTLES DRAWN AEROBIC AND ANAEROBIC Blood Culture adequate volume   Culture   Final    NO GROWTH 3 DAYS Performed at [facility], [address] [place], [place] [address]    Report Status PENDING  Incomplete  Culture, blood (x 2)     Status: None (Preliminary result)   Collection Time: 09/23/18  6:03 PM  Result Value Ref Range Status   Specimen Description BLOOD RIGHT HAND  Final   Special Requests   Final    BOTTLES DRAWN AEROBIC AND ANAEROBIC Blood Culture adequate volume   Culture   Final    NO GROWTH 3 DAYS Performed at  [facility], [address] [place], [place] [address]    Report Status PENDING  Incomplete  Surgical PCR screen     Status: Abnormal   Collection Time: 09/23/18  9:13 PM  Result Value Ref Range Status   MRSA, PCR NEGATIVE NEGATIVE Final   Staphylococcus aureus POSITIVE (A) NEGATIVE Final    Comment: RESULT CALLED TO, READ BACK BY AND VERIFIED WITH: C HOPKINS,RN @0041  09/24/18 MKELLY (NOTE) The Xpert SA Assay (FDA approved for NASAL specimens in patients 67 years of age and older), is one component of a comprehensive surveillance program. It is not intended to diagnose infection nor to guide or monitor treatment. Performed at [facility], [address] [place], [place] [address]     Medical History: Past Medical History:  Diagnosis Date  . Acid reflux   . Hypercholesteremia   . Hypertension     Medications:  See med rec  Assessment: 67 yo female who is post op D#2 after lap chole. Pharmacy consulted for replacement of low phosphate. Current phos is 2.6  Goal of Therapy:  Phosphate 2.5-4.6  Plan:  Na  Phosphate 10mmol IV x 1 F/U labs  Judeth CornfieldSteven Donis Kotowski, PharmD Clinical Pharmacist 09/26/2018 12:30 PM

## 2018-09-26 NOTE — Progress Notes (Signed)
Pharmacy Antibiotic Note  Tamara CatenaJoslane Santiago is a 67 y.o. female admitted on 09/23/2018 with intra-abdominal infection. Day #2 post-op. Pharmacy has been consulted for Zosyn dosing.  Plan: Zosyn 3.375g IV q8h (4 hour infusion).  Monitor labs, c/s, and patient improvement  Height: 5\' 5"  (165.1 cm) Weight: 171 lb (77.6 kg) IBW/kg (Calculated) : 57  Temp (24hrs), Avg:99 F (37.2 C), Min:98.7 F (37.1 C), Max:99.2 F (37.3 C)  Recent Labs  Lab 09/23/18 1159 09/23/18 1755 09/23/18 2031 09/24/18 0525 09/25/18 0430 09/26/18 0523  WBC 17.4*  --   --  17.5* 17.5* 16.5*  CREATININE 0.98  --   --  0.94 0.97 0.86  LATICACIDVEN  --  1.6 1.1  --   --   --     Estimated Creatinine Clearance: 64.4 mL/min (by C-G formula based on SCr of 0.86 mg/dL).    No Known Allergies  Antimicrobials this admission: Zosyn 11/19 >>    Dose adjustments this admission: N/A  Microbiology results: 11/19 BCx: ngtd 11/20 MRSA surgical PCR: positive   Thank you for allowing pharmacy to be a part of this patient's care.  Judeth CornfieldSteven Amariss Detamore, PharmD Clinical Pharmacist 09/26/2018 12:31 PM

## 2018-09-26 NOTE — Care Management Important Message (Signed)
Important Message  Patient Details  Name: Tamara Santiago MRN: [id number] Date of Birth: 10/28/1951   Medicare Important Message Given:  Yes    Renie OraHawkins, Axton Cihlar Smith 09/26/2018, 10:55 AM

## 2018-09-27 LAB — CBC
HCT: 31.5 % — ABNORMAL LOW (ref 36.0–46.0)
Hemoglobin: 10.1 g/dL — ABNORMAL LOW (ref 12.0–15.0)
MCH: 29.7 pg (ref 26.0–34.0)
MCHC: 32.1 g/dL (ref 30.0–36.0)
MCV: 92.6 fL (ref 80.0–100.0)
Platelets: 366 10*3/uL (ref 150–400)
RBC: 3.4 MIL/uL — ABNORMAL LOW (ref 3.87–5.11)
RDW: 14.3 % (ref 11.5–15.5)
WBC: 12 10*3/uL — ABNORMAL HIGH (ref 4.0–10.5)
nRBC: 0 % (ref 0.0–0.2)

## 2018-09-27 LAB — PHOSPHORUS: Phosphorus: 2.4 mg/dL — ABNORMAL LOW (ref 2.5–4.6)

## 2018-09-27 MED ORDER — POTASSIUM PHOSPHATES 15 MMOLE/5ML IV SOLN
20.0000 mmol | Freq: Once | INTRAVENOUS | Status: AC
  Administered 2018-09-27: 20 mmol via INTRAVENOUS
  Filled 2018-09-27: qty 6.67

## 2018-09-27 NOTE — Plan of Care (Signed)

## 2018-09-27 NOTE — Progress Notes (Signed)
MEDICATION RELATED CONSULT NOTE  Pharmacy Consult for IV Phosphate replacement Indication:  Hypophosphatemia  No Known Allergies  Patient Measurements: Height: 5\' 5"  (165.1 cm) Weight: 171 lb (77.6 kg) IBW/kg (Calculated) : 57  Vital Signs: Temp: 98.7 F (37.1 C) (11/23 0559) Temp Source: Oral (11/22 2228) BP: 141/80 (11/23 0559) Pulse Rate: 108 (11/23 0559) Intake/Output from previous day: 11/22 0701 - 11/23 0700 In: 1147.7 [P.O.:720; I.V.:343.3; IV Piggyback:84.5] Out: -  Intake/Output from this shift: No intake/output data recorded.  Labs: Recent Labs    09/25/18 0430 09/26/18 0523 09/27/18 0603  WBC 17.5* 16.5* 12.0*  HGB 10.7* 10.2* 10.1*  HCT 33.8* 32.2* 31.5*  PLT 297 335 366  CREATININE 0.97 0.86  --   MG 2.0 1.9  --   PHOS 2.2* 2.6 2.4*  ALBUMIN 2.3* 2.1*  --   PROT 6.2* 6.1*  --   AST 21 13*  --   ALT 17 17  --   ALKPHOS 66 66  --   BILITOT 1.4* 1.0  --    Estimated Creatinine Clearance: 64.4 mL/min (by C-G formula based on SCr of 0.86 mg/dL).   Microbiology: Recent Results (from the past 720 hour(s))  Culture, blood (x 2)     Status: None (Preliminary result)   Collection Time: 09/23/18  5:54 PM  Result Value Ref Range Status   Specimen Description RIGHT ANTECUBITAL  Final   Special Requests   Final    BOTTLES DRAWN AEROBIC AND ANAEROBIC Blood Culture adequate volume   Culture   Final    NO GROWTH 3 DAYS Performed at [facility], [address] [place], [place] [address]    Report Status PENDING  Incomplete  Culture, blood (x 2)     Status: None (Preliminary result)   Collection Time: 09/23/18  6:03 PM  Result Value Ref Range Status   Specimen Description BLOOD RIGHT HAND  Final   Special Requests   Final    BOTTLES DRAWN AEROBIC AND ANAEROBIC Blood Culture adequate volume   Culture   Final    NO GROWTH 3 DAYS Performed at [facility], [address] [place], [place] [address]    Report Status PENDING  Incomplete  Surgical PCR  screen     Status: Abnormal   Collection Time: 09/23/18  9:13 PM  Result Value Ref Range Status   MRSA, PCR NEGATIVE NEGATIVE Final   Staphylococcus aureus POSITIVE (A) NEGATIVE Final    Comment: RESULT CALLED TO, READ BACK BY AND VERIFIED WITH: C HOPKINS,RN @0041  09/24/18 MKELLY (NOTE) The Xpert SA Assay (FDA approved for NASAL specimens in patients 68 years of age and older), is one component of a comprehensive surveillance program. It is not intended to diagnose infection nor to guide or monitor treatment. Performed at [facility], [address] [place], [place] [address]     Medical History: Past Medical History:  Diagnosis Date  . Acid reflux   . Hypercholesteremia   . Hypertension     Medications:  See med rec  Assessment: 67 yo female who is post op D#2 after lap chole. Pharmacy consulted for replacement of low phosphate. Current phos is 2.4  Goal of Therapy:  Phosphate 2.5-4.6  Plan:  Potassium Phosphate 20mmol IV x 1 F/U labs  Judeth CornfieldSteven Ahniya Mitchum, PharmD Clinical Pharmacist 09/27/2018 8:52 AM

## 2018-09-27 NOTE — Progress Notes (Signed)
3 Days Post-Op  Subjective: Patient feels better.  She is more active.  No bowel movement yet.  Objective: Vital signs in last 24 hours: Temp:  [98 F (36.7 C)-98.7 F (37.1 C)] 98.7 F (37.1 C) (11/23 0559) Pulse Rate:  [83-108] 108 (11/23 0559) Resp:  [16-19] 19 (11/23 0559) BP: (101-141)/(56-80) 141/80 (11/23 0559) SpO2:  [94 %-97 %] 97 % (11/23 0559) Last BM Date: 09/21/18  Intake/Output from previous day: 11/22 0701 - 11/23 0700 In: 1147.7 [P.O.:720; I.V.:343.3; IV Piggyback:84.5] Out: -  Intake/Output this shift: No intake/output data recorded.  General appearance: alert, cooperative and no distress Resp: clear to auscultation bilaterally Cardio: regular rate and rhythm, S1, S2 normal, no murmur, click, rub or gallop GI: soft, non-tender; bowel sounds normal; no masses,  no organomegaly and Incisions healing well.  Lab Results:  Recent Labs    09/26/18 0523 09/27/18 0603  WBC 16.5* 12.0*  HGB 10.2* 10.1*  HCT 32.2* 31.5*  PLT 335 366   BMET Recent Labs    09/25/18 0430 09/26/18 0523  NA 138 137  K 4.2 3.6  CL 107 104  CO2 26 26  GLUCOSE 116* 102*  BUN 13 8  CREATININE 0.97 0.86  CALCIUM 8.1* 8.0*   PT/INR No results for input(s): LABPROT, INR in the last 72 hours.  Studies/Results: No results found.  Anti-infectives: Anti-infectives (From admission, onward)   Start     Dose/Rate Route Frequency Ordered Stop   09/24/18 0600  piperacillin-tazobactam (ZOSYN) IVPB 3.375 g     3.375 g 12.5 mL/hr over 240 Minutes Intravenous Every 8 hours 09/23/18 2352     09/24/18 0400  piperacillin-tazobactam (ZOSYN) IVPB 3.375 g  Status:  Discontinued     3.375 g 12.5 mL/hr over 240 Minutes Intravenous Every 8 hours 09/23/18 2050 09/23/18 2352   09/23/18 2200  piperacillin-tazobactam (ZOSYN) IVPB 3.375 g  Status:  Discontinued     3.375 g 12.5 mL/hr over 240 Minutes Intravenous Every 8 hours 09/23/18 1820 09/23/18 2050   09/23/18 2100  piperacillin-tazobactam  (ZOSYN) IVPB 3.375 g    Note to Pharmacy:  ED dose never given   3.375 g 100 mL/hr over 30 Minutes Intravenous  Once 09/23/18 2048 09/23/18 2328   09/23/18 1430  piperacillin-tazobactam (ZOSYN) IVPB 3.375 g  Status:  Discontinued     3.375 g 100 mL/hr over 30 Minutes Intravenous  Once 09/23/18 1426 09/23/18 2048      Assessment/Plan: s/p Procedure(s): LAPAROSCOPIC CHOLECYSTECTOMY Impression: Postoperative day 3.  Patient's leukocytosis resolving.  Awaiting full return of bowel function.  Home health PT has been arranged.  Anticipate discharge in next 24 to 48 hours.  Hypophosphatemia being addressed.  LOS: 4 days    Tamara MachoMark Sharayah Renfrow 09/27/2018

## 2018-09-28 LAB — CULTURE, BLOOD (ROUTINE X 2)
Culture: NO GROWTH
Culture: NO GROWTH
Special Requests: ADEQUATE
Special Requests: ADEQUATE

## 2018-09-28 LAB — PHOSPHORUS: Phosphorus: 3.1 mg/dL (ref 2.5–4.6)

## 2018-09-28 MED ORDER — CIPROFLOXACIN HCL 500 MG PO TABS: 500.0000 mg | ORAL_TABLET | Freq: Two times a day (BID) | ORAL | 0 refills | Status: DC

## 2018-09-28 MED ORDER — HYDROCODONE-ACETAMINOPHEN 5-325 MG PO TABS: 1.0000 | ORAL_TABLET | Freq: Four times a day (QID) | ORAL | 0 refills | Status: DC | PRN

## 2018-09-28 NOTE — Discharge Instructions (Signed)
Laparoscopic Cholecystectomy, Care After °This sheet gives you information about how to care for yourself after your procedure. Your health care provider may also give you more specific instructions. If you have problems or questions, contact your health care provider. °What can I expect after the procedure? °After the procedure, it is common to have: °· Pain at your incision sites. You will be given medicines to control this pain. °· Mild nausea or vomiting. °· Bloating and possible shoulder pain from the air-like gas that was used during the procedure. ° °Follow these instructions at home: °Incision care ° °· Follow instructions from your health care provider about how to take care of your incisions. Make sure you: °? Wash your hands with soap and water before you change your bandage (dressing). If soap and water are not available, use hand sanitizer. °? Change your dressing as told by your health care provider. °? Leave stitches (sutures), skin glue, or adhesive strips in place. These skin closures may need to be in place for 2 weeks or longer. If adhesive strip edges start to loosen and curl up, you may trim the loose edges. Do not remove adhesive strips completely unless your health care provider tells you to do that. °· Do not take baths, swim, or use a hot tub until your health care provider approves. Ask your health care provider if you can take showers. You may only be allowed to take sponge baths for bathing. °· Check your incision area every day for signs of infection. Check for: °? More redness, swelling, or pain. °? More fluid or blood. °? Warmth. °? Pus or a bad smell. °Activity °· Do not drive or use heavy machinery while taking prescription pain medicine. °· Do not lift anything that is heavier than 10 lb (4.5 kg) until your health care provider approves. °· Do not play contact sports until your health care provider approves. °· Do not drive for 24 hours if you were given a medicine to help you relax  (sedative). °· Rest as needed. Do not return to work or school until your health care provider approves. °General instructions °· Take over-the-counter and prescription medicines only as told by your health care provider. °· To prevent or treat constipation while you are taking prescription pain medicine, your health care provider may recommend that you: °? Drink enough fluid to keep your urine clear or pale yellow. °? Take over-the-counter or prescription medicines. °? Eat foods that are high in fiber, such as fresh fruits and vegetables, whole grains, and beans. °? Limit foods that are high in fat and processed sugars, such as fried and sweet foods. °Contact a health care provider if: °· You develop a rash. °· You have more redness, swelling, or pain around your incisions. °· You have more fluid or blood coming from your incisions. °· Your incisions feel warm to the touch. °· You have pus or a bad smell coming from your incisions. °· You have a fever. °· One or more of your incisions breaks open. °Get help right away if: °· You have trouble breathing. °· You have chest pain. °· You have increasing pain in your shoulders. °· You faint or feel dizzy when you stand. °· You have severe pain in your abdomen. °· You have nausea or vomiting that lasts for more than one day. °· You have leg pain. °This information is not intended to replace advice given to you by your health care provider. Make sure you discuss any questions you   have with your health care provider. °Document Released: 10/22/2005 Document Revised: 05/12/2016 Document Reviewed: 04/09/2016 °Elsevier Interactive Patient Education © 2018 [employer] Inc[employer] ° °

## 2018-09-28 NOTE — Discharge Summary (Signed)
Physician Discharge Summary  Patient ID: Trinitie Mcgirr MRN: [id number] DOB/AGE: Oct 08, 1951 67 y.o.  Admit date: 09/23/2018 Discharge date: 09/28/2018  Admission Diagnoses: Acute cholecystitis, cholelithiasis  Discharge Diagnoses: Same, hypophosphatemia Active Problems:   Acute cholecystitis   Hypokalemia   Cholangitis   Sepsis [facility])   Discharged Condition: good  Hospital Course: Patient is a 67 year old black female who presented to the emergency room with worsening right upper quadrant abdominal pain.  She was noted to have a significant leukocytosis.  She was also hypokalemic.  She was admitted to the hospital for acute cholecystitis with cholelithiasis.  Surgery consultation was obtained and the patient subsequently underwent laparoscopic cholecystectomy on 09/24/2018.  The patient did have an empyema of the gallbladder.  Her postoperative course was for the most part unremarkable.  Her hypokalemia resolved.  She did have hypophosphatemia which was addressed.  Her leukocytosis subsequently improved while on Zosyn.  Physical therapy was consulted due to were made for a walker.  She will have home health PT.  She is being discharged home on 09/28/2018 in good and improving condition.  Treatments: surgery: Laparoscopic cholecystectomy on 09/24/2018  Discharge Exam: Blood pressure 138/77, pulse (!) 104, temperature 98.4 F (36.9 C), temperature source Oral, resp. rate (!) 22, height 5\' 5"  (1.651 m), weight 77.6 kg, SpO2 94 %. General appearance: alert, cooperative and no distress Resp: clear to auscultation bilaterally Cardio: regular rate and rhythm, S1, S2 normal, no murmur, click, rub or gallop GI: Soft, incisions healing well.  Bowel sounds active.  Disposition: Discharge disposition: 01-Home or Self Care       Discharge Instructions    Diet - low sodium heart healthy   Complete by:  As directed    Increase activity slowly   Complete by:  As directed      Allergies as  of 09/28/2018   No Known Allergies     Medication List    TAKE these medications   amitriptyline 100 MG tablet Commonly known as:  ELAVIL Take 100 mg by mouth at bedtime.   aspirin EC 81 MG tablet Take 81 mg by mouth daily.   ciprofloxacin 500 MG tablet Commonly known as:  CIPRO Take 1 tablet (500 mg total) by mouth 2 (two) times daily.   hydrochlorothiazide 25 MG tablet Commonly known as:  HYDRODIURIL Take 25 mg by mouth daily.   HYDROcodone-acetaminophen 5-325 MG tablet Commonly known as:  NORCO/VICODIN Take 1 tablet by mouth every 6 (six) hours as needed for moderate pain.   loperamide 2 MG capsule Commonly known as:  IMODIUM Take 1 capsule (2 mg total) by mouth 4 (four) times daily as needed for diarrhea or loose stools.   loratadine 10 MG tablet Commonly known as:  CLARITIN Take 10 mg by mouth daily.   losartan 50 MG tablet Commonly known as:  COZAAR Take 50 mg by mouth daily.   metoprolol tartrate 50 MG tablet Commonly known as:  LOPRESSOR Take 50 mg by mouth daily.   omeprazole 40 MG capsule Commonly known as:  PRILOSEC Take 40 mg by mouth daily.   ondansetron 4 MG disintegrating tablet Commonly known as:  ZOFRAN-ODT Take 1 tablet (4 mg total) by mouth every 8 (eight) hours as needed for nausea or vomiting.   potassium chloride SA 20 MEQ tablet Commonly known as:  K-DUR,KLOR-CON Take 2 tablets (40 mEq total) by mouth daily.   Vitamin D (Cholecalciferol) 25 MCG (1000 UT) Tabs Take 1 tablet by mouth daily.  Durable Medical Equipment  (From admission, onward)         Start     Ordered   09/25/18 0917  For home use only DME Walker rolling  Once    Question:  Patient needs a walker to treat with the following condition  Answer:  General weakness   09/25/18 0916         Follow-up Information    Franky MachoJenkins, Mala Gibbard, MD. Schedule an appointment as soon as possible for a visit on 09/30/2018.   Specialty:  General Surgery Contact  information: [address] [place] [address] [phone]           Signed: Franky MachoMark Corrie Reder 09/28/2018, 11:35 AM

## 2018-09-30 ENCOUNTER — Ambulatory Visit (INDEPENDENT_AMBULATORY_CARE_PROVIDER_SITE_OTHER): Payer: Self-pay | Admitting: General Surgery

## 2018-09-30 ENCOUNTER — Telehealth: Payer: Self-pay | Admitting: Emergency Medicine

## 2018-09-30 ENCOUNTER — Encounter: Payer: Self-pay | Admitting: General Surgery

## 2018-09-30 VITALS — BP 132/80 | HR 100 | Temp 96.7°F | Resp 18 | Wt 175.2 lb

## 2018-09-30 DIAGNOSIS — Z09 Encounter for follow-up examination after completed treatment for conditions other than malignant neoplasm: Secondary | ICD-10-CM

## 2018-09-30 NOTE — Progress Notes (Signed)
Subjective:     Tamara Santiago  Status post laparoscopic cholecystectomy for empyema of the gallbladder.  Patient doing well.  She is going to be starting home health PT due to gait issues that were present before the surgery.  She denies any fever or chills.  She is still starting her ciprofloxacin.  She denies any nausea or vomiting. Objective:    BP 132/80 (BP Location: Left Arm, Patient Position: Sitting, Cuff Size: Normal)   Pulse 100   Temp (!) 96.7 F (35.9 C) (Temporal)   Resp 18   Wt 175 lb 3.2 oz (79.5 kg)   BMI 29.15 kg/m   General:  alert, cooperative and no distress  Abdomen soft, incisions healing well.  Staples removed, Steri-Strips applied. Final pathology consistent with diagnosis.     Assessment:    Doing well postoperatively.    Plan:   May resume normal activity.  Follow-up here as needed.

## 2018-09-30 NOTE — Telephone Encounter (Signed)
Physical therapist Eunice BlaseDebbie dabs called and stated patient declined pt until after the holidays, notified the provider

## 2019-03-26 NOTE — Progress Notes (Deleted)
Psychiatric Initial Adult Assessment   Patient Identification: Tamara Santiago MRN:  [id number] Date of Evaluation:  03/26/2019 Referral Source: Pearson GrippeKim, James, MD Chief Complaint:   Visit Diagnosis: No diagnosis found.  History of Present Illness:   Tamara Santiago is a 68 y.o. year old female with a history of depression, s/p laparoscopic cholecystectomy, who is referred for depression.   amitryptyline  Associated Signs/Symptoms: Depression Symptoms:  {DEPRESSION SYMPTOMS:20000} (Hypo) Manic Symptoms:  {BHH MANIC SYMPTOMS:22872} Anxiety Symptoms:  {BHH ANXIETY SYMPTOMS:22873} Psychotic Symptoms:  {BHH PSYCHOTIC SYMPTOMS:22874} PTSD Symptoms: {BHH PTSD SYMPTOMS:22875}  Past Psychiatric History:  Outpatient:  Psychiatry admission:  Previous suicide attempt:  Past trials of medication:  History of violence:   Previous Psychotropic Medications: {YES/NO:21197}  Substance Abuse History in the last 12 months:  {yes no:314532}  Consequences of Substance Abuse: {BHH CONSEQUENCES OF SUBSTANCE ABUSE:22880}  Past Medical History:  Past Medical History:  Diagnosis Date  . Acid reflux   . Hypercholesteremia   . Hypertension     Past Surgical History:  Procedure Laterality Date  . CHOLECYSTECTOMY N/A 09/24/2018   Procedure: LAPAROSCOPIC CHOLECYSTECTOMY;  Surgeon: Franky MachoJenkins, Mark, MD;  Location: [facility];  Service: General;  Laterality: N/A;  . TUBAL LIGATION      Family Psychiatric History: ***  Family History: No family history on file.  Social History:   Social History   Socioeconomic History  . Marital status: Divorced    Spouse name: Not on file  . Number of children: Not on file  . Years of education: Not on file  . Highest education level: Not on file  Occupational History  . Not on file  Social Needs  . Financial resource strain: Not on file  . Food insecurity:    Worry: Not on file    Inability: Not on file  . Transportation needs:    Medical: Not on file   Non-medical: Not on file  Tobacco Use  . Smoking status: Current Some Day Smoker  . Smokeless tobacco: Never Used  Substance and Sexual Activity  . Alcohol use: No  . Drug use: No  . Sexual activity: Not on file  Lifestyle  . Physical activity:    Days per week: Not on file    Minutes per session: Not on file  . Stress: Not on file  Relationships  . Social connections:    Talks on phone: Not on file    Gets together: Not on file    Attends religious service: Not on file    Active member of club or organization: Not on file    Attends meetings of clubs or organizations: Not on file    Relationship status: Not on file  Other Topics Concern  . Not on file  Social History Narrative  . Not on file    Additional Social History: ***  Allergies:  No Known Allergies  Metabolic Disorder Labs: No results found for: HGBA1C, MPG No results found for: PROLACTIN No results found for: CHOL, TRIG, HDL, CHOLHDL, VLDL, LDLCALC No results found for: TSH  Therapeutic Level Labs: No results found for: LITHIUM No results found for: CBMZ No results found for: VALPROATE  Current Medications: Current Outpatient Medications  Medication Sig Dispense Refill  . amitriptyline (ELAVIL) 100 MG tablet Take 100 mg by mouth at bedtime.    Marland Kitchen. aspirin EC 81 MG tablet Take 81 mg by mouth daily.    . ciprofloxacin (CIPRO) 500 MG tablet Take 1 tablet (500 mg total) by mouth 2 (two)  times daily. 10 tablet 0  . hydrochlorothiazide (HYDRODIURIL) 25 MG tablet Take 25 mg by mouth daily.    Marland Kitchen HYDROcodone-acetaminophen (NORCO) 5-325 MG tablet Take 1 tablet by mouth every 6 (six) hours as needed for moderate pain. 25 tablet 0  . loperamide (IMODIUM) 2 MG capsule Take 1 capsule (2 mg total) by mouth 4 (four) times daily as needed for diarrhea or loose stools. (Patient not taking: Reported on 09/23/2018) 12 capsule 0  . loratadine (CLARITIN) 10 MG tablet Take 10 mg by mouth daily.    Marland Kitchen losartan (COZAAR) 50 MG tablet  Take 50 mg by mouth daily.    . metoprolol tartrate (LOPRESSOR) 50 MG tablet Take 50 mg by mouth daily.    Marland Kitchen omeprazole (PRILOSEC) 40 MG capsule Take 40 mg by mouth daily.    . ondansetron (ZOFRAN ODT) 4 MG disintegrating tablet Take 1 tablet (4 mg total) by mouth every 8 (eight) hours as needed for nausea or vomiting. (Patient not taking: Reported on 09/23/2018) 20 tablet 0  . potassium chloride SA (K-DUR,KLOR-CON) 20 MEQ tablet Take 2 tablets (40 mEq total) by mouth daily. 8 tablet 0  . Vitamin D, Cholecalciferol, 1000 units TABS Take 1 tablet by mouth daily.     No current facility-administered medications for this visit.     Musculoskeletal: Strength & Muscle Tone: N/A Gait & Station: N/A Patient leans: N/A  Psychiatric Specialty Exam: ROS  There were no vitals taken for this visit.There is no height or weight on file to calculate BMI.  General Appearance: {Appearance:22683}  Eye Contact:  {BHH EYE CONTACT:22684}  Speech:  Clear and Coherent  Volume:  Normal  Mood:  {BHH MOOD:22306}  Affect:  {Affect (PAA):22687}  Thought Process:  Coherent  Orientation:  Full (Time, Place, and Person)  Thought Content:  Logical  Suicidal Thoughts:  {ST/HT (PAA):22692}  Homicidal Thoughts:  {ST/HT (PAA):22692}  Memory:  Immediate;   Good  Judgement:  {Judgement (PAA):22694}  Insight:  {Insight (PAA):22695}  Psychomotor Activity:  Normal  Concentration:  Concentration: Good and Attention Span: Good  Recall:  Good  Fund of Knowledge:Good  Language: Good  Akathisia:  No  Handed:  Right  AIMS (if indicated):  not done  Assets:  Communication Skills Desire for Improvement  ADL's:  Intact  Cognition: WNL  Sleep:  {BHH GOOD/FAIR/POOR:22877}   Screenings:   Assessment and Plan:    Plan  The patient demonstrates the following risk factors for suicide: Chronic risk factors for suicide include: {Chronic Risk Factors for NIDPOEU:23536144}. Acute risk factors for suicide include: {Acute  Risk Factors for RXVQMGQ:67619509}. Protective factors for this patient include: {Protective Factors for Suicide TOIZ:12458099}. Considering these factors, the overall suicide risk at this point appears to be {Desc; low/moderate/high:110033}. Patient {ACTION; IS/IS IPJ:82505397} appropriate for outpatient follow up.   Neysa Hotter, MD 5/21/202011:11 AM

## 2019-04-02 ENCOUNTER — Ambulatory Visit (HOSPITAL_COMMUNITY): Payer: Self-pay | Admitting: Psychiatry

## 2019-06-01 NOTE — Progress Notes (Deleted)
Psychiatric Initial Adult Assessment   Patient Identification: Tamara Santiago MRN:  [id number] Date of Evaluation:  06/01/2019 Referral Source: *** Chief Complaint:   Visit Diagnosis: No diagnosis found.  History of Present Illness:   Tamara Santiago is a 68 y.o. year old female with a history of depression, s/p laparoscopic cholecystectomy for empyema of the gallbladder, who is referred for     Associated Signs/Symptoms: Depression Symptoms:  {DEPRESSION SYMPTOMS:20000} (Hypo) Manic Symptoms:  {BHH MANIC SYMPTOMS:22872} Anxiety Symptoms:  {BHH ANXIETY SYMPTOMS:22873} Psychotic Symptoms:  {BHH PSYCHOTIC SYMPTOMS:22874} PTSD Symptoms: {BHH PTSD SYMPTOMS:22875}  Past Psychiatric History:  Outpatient:  Psychiatry admission:  Previous suicide attempt:  Past trials of medication:  History of violence:   Previous Psychotropic Medications: {YES/NO:21197}  Substance Abuse History in the last 12 months:  {yes no:314532}  Consequences of Substance Abuse: {BHH CONSEQUENCES OF SUBSTANCE ABUSE:22880}  Past Medical History:  Past Medical History:  Diagnosis Date  . Acid reflux   . Hypercholesteremia   . Hypertension     Past Surgical History:  Procedure Laterality Date  . CHOLECYSTECTOMY N/A 09/24/2018   Procedure: LAPAROSCOPIC CHOLECYSTECTOMY;  Surgeon: Franky MachoJenkins, Mark, MD;  Location: [facility];  Service: General;  Laterality: N/A;  . TUBAL LIGATION      Family Psychiatric History: ***  Family History: No family history on file.  Social History:   Social History   Socioeconomic History  . Marital status: Divorced    Spouse name: Not on file  . Number of children: Not on file  . Years of education: Not on file  . Highest education level: Not on file  Occupational History  . Not on file  Social Needs  . Financial resource strain: Not on file  . Food insecurity    Worry: Not on file    Inability: Not on file  . Transportation needs    Medical: Not on file   Non-medical: Not on file  Tobacco Use  . Smoking status: Current Some Day Smoker  . Smokeless tobacco: Never Used  Substance and Sexual Activity  . Alcohol use: No  . Drug use: No  . Sexual activity: Not on file  Lifestyle  . Physical activity    Days per week: Not on file    Minutes per session: Not on file  . Stress: Not on file  Relationships  . Social Musicianconnections    Talks on phone: Not on file    Gets together: Not on file    Attends religious service: Not on file    Active member of club or organization: Not on file    Attends meetings of clubs or organizations: Not on file    Relationship status: Not on file  Other Topics Concern  . Not on file  Social History Narrative  . Not on file    Additional Social History: ***  Allergies:  No Known Allergies  Metabolic Disorder Labs: No results found for: HGBA1C, MPG No results found for: PROLACTIN No results found for: CHOL, TRIG, HDL, CHOLHDL, VLDL, LDLCALC No results found for: TSH  Therapeutic Level Labs: No results found for: LITHIUM No results found for: CBMZ No results found for: VALPROATE  Current Medications: Current Outpatient Medications  Medication Sig Dispense Refill  . amitriptyline (ELAVIL) 100 MG tablet Take 100 mg by mouth at bedtime.    Marland Kitchen. aspirin EC 81 MG tablet Take 81 mg by mouth daily.    . ciprofloxacin (CIPRO) 500 MG tablet Take 1 tablet (500 mg total) by mouth  2 (two) times daily. 10 tablet 0  . hydrochlorothiazide (HYDRODIURIL) 25 MG tablet Take 25 mg by mouth daily.    Marland Kitchen HYDROcodone-acetaminophen (NORCO) 5-325 MG tablet Take 1 tablet by mouth every 6 (six) hours as needed for moderate pain. 25 tablet 0  . loperamide (IMODIUM) 2 MG capsule Take 1 capsule (2 mg total) by mouth 4 (four) times daily as needed for diarrhea or loose stools. (Patient not taking: Reported on 09/23/2018) 12 capsule 0  . loratadine (CLARITIN) 10 MG tablet Take 10 mg by mouth daily.    Marland Kitchen losartan (COZAAR) 50 MG tablet  Take 50 mg by mouth daily.    . metoprolol tartrate (LOPRESSOR) 50 MG tablet Take 50 mg by mouth daily.    Marland Kitchen omeprazole (PRILOSEC) 40 MG capsule Take 40 mg by mouth daily.    . ondansetron (ZOFRAN ODT) 4 MG disintegrating tablet Take 1 tablet (4 mg total) by mouth every 8 (eight) hours as needed for nausea or vomiting. (Patient not taking: Reported on 09/23/2018) 20 tablet 0  . potassium chloride SA (K-DUR,KLOR-CON) 20 MEQ tablet Take 2 tablets (40 mEq total) by mouth daily. 8 tablet 0  . Vitamin D, Cholecalciferol, 1000 units TABS Take 1 tablet by mouth daily.     No current facility-administered medications for this visit.     Musculoskeletal: Strength & Muscle Tone: N/A Gait & Station: N/A Patient leans: N/A  Psychiatric Specialty Exam: ROS  There were no vitals taken for this visit.There is no height or weight on file to calculate BMI.  General Appearance: {Appearance:22683}  Eye Contact:  {BHH EYE CONTACT:22684}  Speech:  Clear and Coherent  Volume:  Normal  Mood:  {BHH MOOD:22306}  Affect:  {Affect (PAA):22687}  Thought Process:  Coherent  Orientation:  Full (Time, Place, and Person)  Thought Content:  Logical  Suicidal Thoughts:  {ST/HT (PAA):22692}  Homicidal Thoughts:  {ST/HT (PAA):22692}  Memory:  Immediate;   Good  Judgement:  {Judgement (PAA):22694}  Insight:  {Insight (PAA):22695}  Psychomotor Activity:  Normal  Concentration:  Concentration: Good and Attention Span: Good  Recall:  Good  Fund of Knowledge:Good  Language: Good  Akathisia:  No  Handed:  Right  AIMS (if indicated):  not done  Assets:  Communication Skills Desire for Improvement  ADL's:  Intact  Cognition: WNL  Sleep:  {BHH GOOD/FAIR/POOR:22877}   Screenings:   Assessment and Plan:  Assessment  Plan  The patient demonstrates the following risk factors for suicide: Chronic risk factors for suicide include: {Chronic Risk Factors for KGURKYH:06237628}. Acute risk factors for suicide  include: {Acute Risk Factors for BTDVVOH:60737106}. Protective factors for this patient include: {Protective Factors for Suicide YIRS:85462703}. Considering these factors, the overall suicide risk at this point appears to be {Desc; low/moderate/high:110033}. Patient {ACTION; IS/IS JKK:93818299} appropriate for outpatient follow up.   Norman Clay, MD 7/27/20209:47 AM

## 2019-06-09 ENCOUNTER — Ambulatory Visit (HOSPITAL_COMMUNITY): Payer: Self-pay | Admitting: Psychiatry

## 2019-07-16 ENCOUNTER — Ambulatory Visit (HOSPITAL_COMMUNITY): Payer: Self-pay | Admitting: Psychiatry

## 2019-12-23 ENCOUNTER — Ambulatory Visit: Payer: Medicare Other | Attending: Internal Medicine

## 2019-12-23 DIAGNOSIS — Z23 Encounter for immunization: Secondary | ICD-10-CM | POA: Insufficient documentation

## 2019-12-23 NOTE — Progress Notes (Signed)
   Covid-19 Vaccination Clinic  Name:  Tamara Santiago    MRN: [id number] DOB: 1951-01-08  12/23/2019  [name] was observed post Covid-19 immunization for 15 minutes without incidence. She was provided with Vaccine Information Sheet and instruction to access the V-Safe system.   [name] was instructed to call 911 with any severe reactions post vaccine: Marland Kitchen Difficulty breathing  . Swelling of your face and throat  . A fast heartbeat  . A bad rash all over your body  . Dizziness and weakness    Immunizations Administered    Name Date Dose VIS Date Route   Moderna COVID-19 Vaccine 12/23/2019 11:57 AM 0.5 mL 10/06/2019 Intramuscular   Manufacturer: Moderna   Lot: [id number]   NDC: 35248-185-90

## 2020-01-20 ENCOUNTER — Ambulatory Visit: Payer: Medicare Other | Attending: Internal Medicine

## 2020-01-20 DIAGNOSIS — Z23 Encounter for immunization: Secondary | ICD-10-CM

## 2020-01-20 NOTE — Progress Notes (Signed)
   Covid-19 Vaccination Clinic  Name:  Tamara Santiago    MRN: [id number] DOB: 07-Jan-1951  01/20/2020  [name] was observed post Covid-19 immunization for 15 minutes without incident. She was provided with Vaccine Information Sheet and instruction to access the V-Safe system.   [name] was instructed to call 911 with any severe reactions post vaccine: Marland Kitchen Difficulty breathing  . Swelling of face and throat  . A fast heartbeat  . A bad rash all over body  . Dizziness and weakness   Immunizations Administered    Name Date Dose VIS Date Route   Moderna COVID-19 Vaccine 01/20/2020 12:09 PM 0.5 mL 10/06/2019 Intramuscular   Manufacturer: Moderna   Lot: [id number]   NDC: 12197-588-32

## 2020-10-24 ENCOUNTER — Other Ambulatory Visit: Payer: Self-pay

## 2020-10-24 ENCOUNTER — Ambulatory Visit (INDEPENDENT_AMBULATORY_CARE_PROVIDER_SITE_OTHER): Payer: Medicare HMO

## 2020-10-24 ENCOUNTER — Ambulatory Visit
Admission: EM | Admit: 2020-10-24 | Discharge: 2020-10-24 | Disposition: A | Discharge status: Home / Self Care | Payer: Medicare HMO | Attending: Urgent Care | Admitting: Urgent Care

## 2020-10-24 ENCOUNTER — Telehealth: Payer: Self-pay

## 2020-10-24 DIAGNOSIS — R296 Repeated falls: Secondary | ICD-10-CM | POA: Diagnosis not present

## 2020-10-24 DIAGNOSIS — S22000A Wedge compression fracture of unspecified thoracic vertebra, initial encounter for closed fracture: Secondary | ICD-10-CM

## 2020-10-24 DIAGNOSIS — H8110 Benign paroxysmal vertigo, unspecified ear: Secondary | ICD-10-CM

## 2020-10-24 DIAGNOSIS — M545 Low back pain, unspecified: Secondary | ICD-10-CM

## 2020-10-24 DIAGNOSIS — M546 Pain in thoracic spine: Secondary | ICD-10-CM

## 2020-10-24 DIAGNOSIS — W19XXXA Unspecified fall, initial encounter: Secondary | ICD-10-CM | POA: Diagnosis not present

## 2020-10-24 LAB — POCT URINALYSIS DIP (MANUAL ENTRY)
Bilirubin, UA: NEGATIVE
Glucose, UA: NEGATIVE mg/dL
Ketones, POC UA: NEGATIVE mg/dL
Leukocytes, UA: NEGATIVE
Nitrite, UA: NEGATIVE
Protein Ur, POC: NEGATIVE mg/dL
Spec Grav, UA: 1.025 (ref 1.010–1.025)
Urobilinogen, UA: 1 E.U./dL
pH, UA: 6.5 (ref 5.0–8.0)

## 2020-10-24 MED ORDER — METHOCARBAMOL 500 MG PO TABS: 500.0000 mg | ORAL_TABLET | Freq: Two times a day (BID) | ORAL | 0 refills | Status: DC | PRN

## 2020-10-24 MED ORDER — OXYCODONE-ACETAMINOPHEN 2.5-300 MG PO TABS: 1.0000 | ORAL_TABLET | Freq: Four times a day (QID) | ORAL | 0 refills | Status: DC | PRN

## 2020-10-24 MED ORDER — MECLIZINE HCL 12.5 MG PO TABS: 12.5000 mg | ORAL_TABLET | Freq: Three times a day (TID) | ORAL | 0 refills | Status: DC | PRN

## 2020-10-24 NOTE — Discharge Instructions (Addendum)
Please schedule Tylenol at 500 mg - 650 mg once every 6 hours as needed for aches and pains.  If you still have pain despite taking Tylenol regularly, this is breakthrough pain.  You can use oxycodone once every 6 hours for this.  Once your pain is better controlled, switch back to just Tylenol.  You can also use Robaxin (methocarbamol) as a muscle relaxant to help with the back spasms and have.  It is okay to use all of this together with Tylenol.  For your vertebral, you can use meclizine but be very careful as using meclizine together with the muscle relaxant strong pain medicine such as oxycodone (a narcotic pain medication) an increase in worsening dizziness and risk for falls.  Please make sure you follow-up with your PCP ASAP.  Contact the neuro spine specialty clinic for follow-up on your compression fracture.
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
## 2020-10-24 NOTE — ED Triage Notes (Signed)
Pts daughter states pt has had 3 falls recently

## 2020-10-25 MED ORDER — HYDROCODONE-ACETAMINOPHEN 5-325 MG PO TABS: 1.0000 | ORAL_TABLET | ORAL | 0 refills | Status: DC | PRN

## 2020-10-25 NOTE — Telephone Encounter (Signed)
[facility] did not have medication. Sent to [facility]

## 2020-11-02 ENCOUNTER — Other Ambulatory Visit (HOSPITAL_COMMUNITY): Payer: Self-pay | Admitting: Family Medicine

## 2020-11-02 ENCOUNTER — Other Ambulatory Visit: Payer: Self-pay

## 2020-11-02 ENCOUNTER — Ambulatory Visit (HOSPITAL_COMMUNITY)
Admission: RE | Admit: 2020-11-02 | Discharge: 2020-11-02 | Disposition: A | Discharge status: Home / Self Care | Payer: Medicare HMO | Source: Ambulatory Visit | Attending: Family Medicine | Admitting: Family Medicine

## 2020-11-02 DIAGNOSIS — F17210 Nicotine dependence, cigarettes, uncomplicated: Secondary | ICD-10-CM

## 2020-11-02 DIAGNOSIS — R0600 Dyspnea, unspecified: Secondary | ICD-10-CM

## 2021-02-13 ENCOUNTER — Other Ambulatory Visit: Payer: Self-pay | Admitting: Neurosurgery

## 2021-02-13 DIAGNOSIS — S22080A Wedge compression fracture of T11-T12 vertebra, initial encounter for closed fracture: Secondary | ICD-10-CM

## 2021-02-27 ENCOUNTER — Ambulatory Visit (INDEPENDENT_AMBULATORY_CARE_PROVIDER_SITE_OTHER): Payer: Medicare HMO | Admitting: Cardiology

## 2021-02-27 ENCOUNTER — Encounter: Payer: Self-pay | Admitting: Cardiology

## 2021-02-27 ENCOUNTER — Other Ambulatory Visit: Payer: Self-pay

## 2021-02-27 VITALS — BP 124/84 | HR 93 | Ht 64.0 in | Wt 198.8 lb

## 2021-02-27 DIAGNOSIS — I517 Cardiomegaly: Secondary | ICD-10-CM | POA: Diagnosis not present

## 2021-02-27 DIAGNOSIS — R0602 Shortness of breath: Secondary | ICD-10-CM

## 2021-02-27 DIAGNOSIS — I10 Essential (primary) hypertension: Secondary | ICD-10-CM

## 2021-02-27 NOTE — Progress Notes (Signed)
Cardiology Office Note  Date: 02/27/2021   ID: Tamara Santiago, DOB 14-Aug-1951, MRN [id number]  PCP:  Pearson Grippe, MD  Cardiologist:  Nona Dell, MD Electrophysiologist:  None   Chief Complaint  Patient presents with  . Shortness of Breath    History of Present Illness: Tamara Santiago is a 70 y.o. female referred for cardiology consultation by Ms. McCorkle NP with history of shortness of breath and mild cardiomegaly evident by chest x-ray in December 2021.  She tells me that she has been having a lot of trouble with lower back pain.  When she ambulates any distance she states that her back feels "tired" and she is fatigued, more short of breath.  She does not describe any wheezing or cough.  No orthopnea or PND.  She has a history of hypertension, blood pressure is reasonably controlled today.  Current medications include Lopressor, losartan, and HCTZ with potassium supplement.  I personally reviewed her ECG today which shows sinus rhythm with right bundle branch block.  She does not have any history of cardiomyopathy, no prior echocardiogram.  Past Medical History:  Diagnosis Date  . Anxiety   . Essential hypertension   . GERD (gastroesophageal reflux disease)   . Hyperlipidemia     Past Surgical History:  Procedure Laterality Date  . CHOLECYSTECTOMY N/A 09/24/2018   Procedure: LAPAROSCOPIC CHOLECYSTECTOMY;  Surgeon: Franky Macho, MD;  Location: [facility];  Service: General;  Laterality: N/A;  . TUBAL LIGATION      Current Outpatient Medications  Medication Sig Dispense Refill  . albuterol (VENTOLIN HFA) 108 (90 Base) MCG/ACT inhaler     . amitriptyline (ELAVIL) 100 MG tablet Take 100 mg by mouth at bedtime.    Marland Kitchen aspirin EC 81 MG tablet Take 81 mg by mouth daily.    . hydrochlorothiazide (HYDRODIURIL) 25 MG tablet Take 25 mg by mouth daily.    Marland Kitchen loratadine (CLARITIN) 10 MG tablet Take 10 mg by mouth daily.    Marland Kitchen losartan (COZAAR) 50 MG tablet Take 50 mg by mouth  daily.    . meclizine (ANTIVERT) 12.5 MG tablet Take 1 tablet (12.5 mg total) by mouth 3 (three) times daily as needed for dizziness. 30 tablet 0  . metoprolol tartrate (LOPRESSOR) 50 MG tablet Take 50 mg by mouth daily.    Marland Kitchen omeprazole (PRILOSEC) 40 MG capsule Take 40 mg by mouth daily.    . potassium chloride SA (K-DUR,KLOR-CON) 20 MEQ tablet Take 2 tablets (40 mEq total) by mouth daily. 8 tablet 0  . Vitamin D, Cholecalciferol, 1000 units TABS Take 1 tablet by mouth daily.    Marland Kitchen HYDROcodone-acetaminophen (NORCO/VICODIN) 5-325 MG tablet Take 1 tablet by mouth every 4 (four) hours as needed. 10 tablet 0  . loperamide (IMODIUM) 2 MG capsule Take 1 capsule (2 mg total) by mouth 4 (four) times daily as needed for diarrhea or loose stools. 12 capsule 0   No current facility-administered medications for this visit.   Allergies:  Patient has no known allergies.   Social History: The patient  reports that she has been smoking cigarettes. She has never used smokeless tobacco. She reports that she does not drink alcohol and does not use drugs.   Family History: The patient's family history includes Diabetes Mellitus II in her mother; Heart disease in her mother; Hyperlipidemia in her mother.   ROS: No palpitations or syncope.  Physical Exam: VS:  BP 124/84   Pulse 93   Ht 5\' 4"  (1.626 m)  Wt 198 lb 12.8 oz (90.2 kg)   SpO2 94%   BMI 34.12 kg/m , BMI Body mass index is 34.12 kg/m.  Wt Readings from Last 3 Encounters:  02/27/21 198 lb 12.8 oz (90.2 kg)  09/30/18 175 lb 3.2 oz (79.5 kg)  09/23/18 171 lb (77.6 kg)    General: Patient appears comfortable at rest. HEENT: Conjunctiva and lids normal, wearing a mask. Neck: Supple, no elevated JVP or carotid bruits, no thyromegaly. Lungs: Inspiratory crackles at the bases, no wheezing.. Cardiac: Regular rate and rhythm, no S3, soft systolic murmur, no pericardial rub. Abdomen: Soft, nontender, bowel sounds present. Extremities: No pitting  edema, distal pulses 2+. Skin: Warm and dry. Musculoskeletal: No kyphosis. Neuropsychiatric: Alert and oriented x3, affect grossly appropriate.  ECG:  An ECG dated 09/23/2018 was personally reviewed today and demonstrated:  Sinus tachycardia with right bundle branch block, possible left atrial enlargement.  Recent Labwork:  December 2021: BUN 10, creatinine 1.05, potassium 3.8, cholesterol 125, triglycerides 143, HDL 42, LDL 58, hemoglobin A1c 6.4%  Other Studies Reviewed Today:  Chest x-ray 11/02/2020: FINDINGS: Stable cardiomediastinal silhouette with mild cardiomegaly. No pneumothorax. No pleural effusion. Lungs appear clear, with no acute consolidative airspace disease and no pulmonary edema.  IMPRESSION: Stable mild cardiomegaly without pulmonary edema. No active pulmonary disease.  Assessment and Plan:  1.  Dyspnea on exertion as discussed above, possibly multifactorial.  Mild cardiomegaly evident by chest x-ray in December 2021, no history of cardiomyopathy.  We will obtain an echocardiogram for cardiac structural evaluation.  2.  Essential hypertension, on losartan, Lopressor, HCTZ, and potassium supplement.  Blood pressure is adequately controlled today.  Medication Adjustments/Labs and Tests Ordered: Current medicines are reviewed at length with the patient today.  Concerns regarding medicines are outlined above.   Tests Ordered: Orders Placed This Encounter  Procedures  . EKG 12-Lead  . ECHOCARDIOGRAM COMPLETE    Medication Changes: No orders of the defined types were placed in this encounter.   Disposition:  Follow up test results.  Signed, Jonelle Sidle, MD, [facility] 02/27/2021 1:56 PM    [facility] at [facility] [address], [place], [place] [address] Phone: [phone]; Fax: [phone]

## 2021-02-27 NOTE — Patient Instructions (Signed)
Medication Instructions:  Your physician recommends that you continue on your current medications as directed. Please refer to the Current Medication list given to you today.  *If you need a refill on your cardiac medications before your next appointment, please call your pharmacy*   Lab Work: None today If you have labs (blood work) drawn today and your tests are completely normal, you will receive your results only by: Marland Kitchen MyChart Message (if you have MyChart) OR . A paper copy in the mail If you have any lab test that is abnormal or we need to change your treatment, we will call you to review the results.   Testing/Procedures: Your physician has requested that you have an echocardiogram. Echocardiography is a painless test that uses sound waves to create images of your heart. It provides your doctor with information about the size and shape of your heart and how well your heart's chambers and valves are working. This procedure takes approximately one hour. There are no restrictions for this procedure.     Follow-Up: At [facility], you and your health needs are our priority.  As part of our continuing mission to provide you with exceptional heart care, we have created designated Provider Care Teams.  These Care Teams include your primary Cardiologist (physician) and Advanced Practice Providers (APPs -  Physician Assistants and Nurse Practitioners) who all work together to provide you with the care you need, when you need it.  We recommend signing up for the patient portal called "MyChart".  Sign up information is provided on this After Visit Summary.  MyChart is used to connect with patients for Virtual Visits (Telemedicine).  Patients are able to view lab/test results, encounter notes, upcoming appointments, etc.  Non-urgent messages can be sent to your provider as well.   To learn more about what you can do with MyChart, go to [email or web address].    Your next appointment:  We  will call you with results.

## 2021-02-28 ENCOUNTER — Ambulatory Visit
Admission: RE | Admit: 2021-02-28 | Discharge: 2021-02-28 | Disposition: A | Discharge status: Home / Self Care | Payer: Medicare HMO | Source: Ambulatory Visit | Attending: Neurosurgery | Admitting: Neurosurgery

## 2021-02-28 DIAGNOSIS — S22080A Wedge compression fracture of T11-T12 vertebra, initial encounter for closed fracture: Secondary | ICD-10-CM

## 2021-03-08 DIAGNOSIS — H2512 Age-related nuclear cataract, left eye: Secondary | ICD-10-CM | POA: Diagnosis not present

## 2021-03-10 DIAGNOSIS — H25812 Combined forms of age-related cataract, left eye: Secondary | ICD-10-CM | POA: Diagnosis not present

## 2021-03-10 DIAGNOSIS — H21562 Pupillary abnormality, left eye: Secondary | ICD-10-CM | POA: Diagnosis not present

## 2021-03-14 DIAGNOSIS — I10 Essential (primary) hypertension: Secondary | ICD-10-CM | POA: Diagnosis not present

## 2021-03-14 DIAGNOSIS — Z6834 Body mass index (BMI) 34.0-34.9, adult: Secondary | ICD-10-CM | POA: Diagnosis not present

## 2021-03-14 DIAGNOSIS — S22080A Wedge compression fracture of T11-T12 vertebra, initial encounter for closed fracture: Secondary | ICD-10-CM | POA: Diagnosis not present

## 2021-03-31 ENCOUNTER — Other Ambulatory Visit: Payer: Self-pay

## 2021-03-31 ENCOUNTER — Telehealth: Payer: Self-pay

## 2021-03-31 ENCOUNTER — Ambulatory Visit (HOSPITAL_COMMUNITY)
Admission: RE | Admit: 2021-03-31 | Discharge: 2021-03-31 | Disposition: A | Discharge status: Home / Self Care | Payer: Medicare HMO | Source: Ambulatory Visit | Attending: Cardiology | Admitting: Cardiology

## 2021-03-31 DIAGNOSIS — R0602 Shortness of breath: Secondary | ICD-10-CM | POA: Insufficient documentation

## 2021-03-31 DIAGNOSIS — I3139 Other pericardial effusion (noninflammatory): Secondary | ICD-10-CM

## 2021-03-31 DIAGNOSIS — I517 Cardiomegaly: Secondary | ICD-10-CM | POA: Diagnosis not present

## 2021-03-31 DIAGNOSIS — I313 Pericardial effusion (noninflammatory): Secondary | ICD-10-CM

## 2021-03-31 LAB — ECHOCARDIOGRAM COMPLETE
Area-P 1/2: 3.17 cm2
S' Lateral: 3.1 cm

## 2021-03-31 NOTE — Telephone Encounter (Signed)
Results given to patient, order placed for limited echo   Appt's made.

## 2021-03-31 NOTE — Telephone Encounter (Signed)
-----   Message from Jonelle Sidle, MD sent at 03/31/2021  1:03 PM EDT ----- Results reviewed.  Please let her know that overall cardiac function is normal, LVEF 55 to 60%.  Left and right ventricular chamber size normal.  She does have a small to moderate pericardial effusion posterior to the heart.  Possibly related to inflammation, but not necessarily with active symptoms at this time.  This could contribute to the appearance of an enlarged heart by chest x-ray.  Recommend follow-up limited echocardiogram in the next 6 weeks with follow-up visit.

## 2021-03-31 NOTE — Progress Notes (Signed)
*  PRELIMINARY RESULTS* Echocardiogram 2D Echocardiogram has been performed.  Stacey Drain 03/31/2021, 10:13 AM

## 2021-04-25 DIAGNOSIS — H524 Presbyopia: Secondary | ICD-10-CM | POA: Diagnosis not present

## 2021-04-25 DIAGNOSIS — Z0100 Encounter for examination of eyes and vision without abnormal findings: Secondary | ICD-10-CM | POA: Diagnosis not present

## 2021-04-25 DIAGNOSIS — H5213 Myopia, bilateral: Secondary | ICD-10-CM | POA: Diagnosis not present

## 2021-04-28 DIAGNOSIS — E119 Type 2 diabetes mellitus without complications: Secondary | ICD-10-CM | POA: Diagnosis not present

## 2021-04-28 DIAGNOSIS — E785 Hyperlipidemia, unspecified: Secondary | ICD-10-CM | POA: Diagnosis not present

## 2021-04-28 DIAGNOSIS — I10 Essential (primary) hypertension: Secondary | ICD-10-CM | POA: Diagnosis not present

## 2021-05-03 DIAGNOSIS — F330 Major depressive disorder, recurrent, mild: Secondary | ICD-10-CM | POA: Diagnosis not present

## 2021-05-03 DIAGNOSIS — E785 Hyperlipidemia, unspecified: Secondary | ICD-10-CM | POA: Diagnosis not present

## 2021-05-03 DIAGNOSIS — E119 Type 2 diabetes mellitus without complications: Secondary | ICD-10-CM | POA: Diagnosis not present

## 2021-05-03 DIAGNOSIS — Z7984 Long term (current) use of oral hypoglycemic drugs: Secondary | ICD-10-CM | POA: Diagnosis not present

## 2021-05-03 DIAGNOSIS — M546 Pain in thoracic spine: Secondary | ICD-10-CM | POA: Diagnosis not present

## 2021-05-03 DIAGNOSIS — Z79899 Other long term (current) drug therapy: Secondary | ICD-10-CM | POA: Diagnosis not present

## 2021-05-03 DIAGNOSIS — I10 Essential (primary) hypertension: Secondary | ICD-10-CM | POA: Diagnosis not present

## 2021-05-17 DIAGNOSIS — S22080A Wedge compression fracture of T11-T12 vertebra, initial encounter for closed fracture: Secondary | ICD-10-CM | POA: Diagnosis not present

## 2021-05-31 DIAGNOSIS — F0280 Dementia in other diseases classified elsewhere without behavioral disturbance: Secondary | ICD-10-CM | POA: Diagnosis not present

## 2021-05-31 DIAGNOSIS — E785 Hyperlipidemia, unspecified: Secondary | ICD-10-CM | POA: Diagnosis not present

## 2021-05-31 DIAGNOSIS — I10 Essential (primary) hypertension: Secondary | ICD-10-CM | POA: Diagnosis not present

## 2021-05-31 DIAGNOSIS — Z0000 Encounter for general adult medical examination without abnormal findings: Secondary | ICD-10-CM | POA: Diagnosis not present

## 2021-05-31 DIAGNOSIS — F17210 Nicotine dependence, cigarettes, uncomplicated: Secondary | ICD-10-CM | POA: Diagnosis not present

## 2021-06-28 DIAGNOSIS — Z79899 Other long term (current) drug therapy: Secondary | ICD-10-CM | POA: Diagnosis not present

## 2021-06-28 DIAGNOSIS — F0280 Dementia in other diseases classified elsewhere without behavioral disturbance: Secondary | ICD-10-CM | POA: Diagnosis not present

## 2021-06-28 DIAGNOSIS — E119 Type 2 diabetes mellitus without complications: Secondary | ICD-10-CM | POA: Diagnosis not present

## 2021-06-28 DIAGNOSIS — Z87311 Personal history of (healed) other pathological fracture: Secondary | ICD-10-CM | POA: Diagnosis not present

## 2021-06-28 DIAGNOSIS — E785 Hyperlipidemia, unspecified: Secondary | ICD-10-CM | POA: Diagnosis not present

## 2021-06-28 DIAGNOSIS — I10 Essential (primary) hypertension: Secondary | ICD-10-CM | POA: Diagnosis not present

## 2021-06-28 DIAGNOSIS — F331 Major depressive disorder, recurrent, moderate: Secondary | ICD-10-CM | POA: Diagnosis not present

## 2021-06-28 DIAGNOSIS — K219 Gastro-esophageal reflux disease without esophagitis: Secondary | ICD-10-CM | POA: Diagnosis not present

## 2021-06-28 DIAGNOSIS — Z13820 Encounter for screening for osteoporosis: Secondary | ICD-10-CM | POA: Diagnosis not present

## 2021-06-28 DIAGNOSIS — Z7984 Long term (current) use of oral hypoglycemic drugs: Secondary | ICD-10-CM | POA: Diagnosis not present

## 2021-06-28 DIAGNOSIS — E876 Hypokalemia: Secondary | ICD-10-CM | POA: Diagnosis not present

## 2021-07-18 DIAGNOSIS — S22080A Wedge compression fracture of T11-T12 vertebra, initial encounter for closed fracture: Secondary | ICD-10-CM | POA: Diagnosis not present

## 2021-08-18 ENCOUNTER — Other Ambulatory Visit (HOSPITAL_COMMUNITY): Payer: Self-pay | Admitting: Family Medicine

## 2021-08-18 DIAGNOSIS — Z13820 Encounter for screening for osteoporosis: Secondary | ICD-10-CM

## 2021-08-18 DIAGNOSIS — Z780 Asymptomatic menopausal state: Secondary | ICD-10-CM

## 2021-08-24 ENCOUNTER — Other Ambulatory Visit (HOSPITAL_COMMUNITY): Payer: Medicare HMO

## 2021-08-28 ENCOUNTER — Other Ambulatory Visit: Payer: Self-pay

## 2021-08-28 ENCOUNTER — Ambulatory Visit (HOSPITAL_COMMUNITY)
Admission: RE | Admit: 2021-08-28 | Discharge: 2021-08-28 | Disposition: A | Discharge status: Home / Self Care | Payer: Medicare HMO | Source: Ambulatory Visit | Attending: Family Medicine | Admitting: Family Medicine

## 2021-08-28 DIAGNOSIS — Z780 Asymptomatic menopausal state: Secondary | ICD-10-CM | POA: Diagnosis not present

## 2021-09-11 DIAGNOSIS — E119 Type 2 diabetes mellitus without complications: Secondary | ICD-10-CM | POA: Diagnosis not present

## 2021-09-11 DIAGNOSIS — F5104 Psychophysiologic insomnia: Secondary | ICD-10-CM | POA: Diagnosis not present

## 2021-09-11 DIAGNOSIS — I951 Orthostatic hypotension: Secondary | ICD-10-CM | POA: Diagnosis not present

## 2021-09-11 DIAGNOSIS — E1143 Type 2 diabetes mellitus with diabetic autonomic (poly)neuropathy: Secondary | ICD-10-CM | POA: Diagnosis not present

## 2021-09-11 DIAGNOSIS — R42 Dizziness and giddiness: Secondary | ICD-10-CM | POA: Diagnosis not present

## 2021-09-11 DIAGNOSIS — G3184 Mild cognitive impairment, so stated: Secondary | ICD-10-CM | POA: Diagnosis not present

## 2021-09-11 DIAGNOSIS — R296 Repeated falls: Secondary | ICD-10-CM | POA: Diagnosis not present

## 2021-09-11 DIAGNOSIS — Z79899 Other long term (current) drug therapy: Secondary | ICD-10-CM | POA: Diagnosis not present

## 2021-09-11 DIAGNOSIS — I10 Essential (primary) hypertension: Secondary | ICD-10-CM | POA: Diagnosis not present

## 2021-09-12 ENCOUNTER — Other Ambulatory Visit: Payer: Self-pay | Admitting: Neurology

## 2021-09-12 ENCOUNTER — Other Ambulatory Visit (HOSPITAL_COMMUNITY): Payer: Self-pay | Admitting: Neurology

## 2021-09-12 DIAGNOSIS — R296 Repeated falls: Secondary | ICD-10-CM

## 2021-09-14 DIAGNOSIS — J309 Allergic rhinitis, unspecified: Secondary | ICD-10-CM | POA: Diagnosis not present

## 2021-09-14 DIAGNOSIS — R42 Dizziness and giddiness: Secondary | ICD-10-CM | POA: Diagnosis not present

## 2021-09-14 DIAGNOSIS — E782 Mixed hyperlipidemia: Secondary | ICD-10-CM | POA: Diagnosis not present

## 2021-09-14 DIAGNOSIS — F17200 Nicotine dependence, unspecified, uncomplicated: Secondary | ICD-10-CM | POA: Diagnosis not present

## 2021-09-14 DIAGNOSIS — E1169 Type 2 diabetes mellitus with other specified complication: Secondary | ICD-10-CM | POA: Diagnosis not present

## 2021-09-14 DIAGNOSIS — I10 Essential (primary) hypertension: Secondary | ICD-10-CM | POA: Diagnosis not present

## 2021-09-14 DIAGNOSIS — M8580 Other specified disorders of bone density and structure, unspecified site: Secondary | ICD-10-CM | POA: Diagnosis not present

## 2021-09-14 DIAGNOSIS — M4854XS Collapsed vertebra, not elsewhere classified, thoracic region, sequela of fracture: Secondary | ICD-10-CM | POA: Diagnosis not present

## 2021-09-14 DIAGNOSIS — H9190 Unspecified hearing loss, unspecified ear: Secondary | ICD-10-CM | POA: Diagnosis not present

## 2021-09-14 DIAGNOSIS — K219 Gastro-esophageal reflux disease without esophagitis: Secondary | ICD-10-CM | POA: Diagnosis not present

## 2021-09-14 DIAGNOSIS — F0390 Unspecified dementia without behavioral disturbance: Secondary | ICD-10-CM | POA: Diagnosis not present

## 2021-09-14 DIAGNOSIS — I517 Cardiomegaly: Secondary | ICD-10-CM | POA: Diagnosis not present

## 2021-09-26 ENCOUNTER — Ambulatory Visit (HOSPITAL_COMMUNITY)
Admission: RE | Admit: 2021-09-26 | Discharge: 2021-09-26 | Disposition: A | Discharge status: Home / Self Care | Payer: Medicare HMO | Source: Ambulatory Visit | Attending: Neurology | Admitting: Neurology

## 2021-09-26 ENCOUNTER — Other Ambulatory Visit: Payer: Self-pay

## 2021-09-26 DIAGNOSIS — R4182 Altered mental status, unspecified: Secondary | ICD-10-CM | POA: Diagnosis not present

## 2021-09-26 DIAGNOSIS — R296 Repeated falls: Secondary | ICD-10-CM | POA: Diagnosis not present

## 2021-10-03 ENCOUNTER — Ambulatory Visit (HOSPITAL_COMMUNITY)
Admission: RE | Admit: 2021-10-03 | Discharge: 2021-10-03 | Disposition: A | Discharge status: Home / Self Care | Payer: Medicare HMO | Source: Ambulatory Visit | Attending: Cardiology | Admitting: Cardiology

## 2021-10-03 DIAGNOSIS — I3139 Other pericardial effusion (noninflammatory): Secondary | ICD-10-CM | POA: Diagnosis not present

## 2021-10-03 LAB — ECHOCARDIOGRAM LIMITED
Calc EF: 52.6 %
S' Lateral: 3.1 cm
Single Plane A2C EF: 59.1 %
Single Plane A4C EF: 54.7 %

## 2021-10-03 NOTE — Progress Notes (Signed)
  Echocardiogram 2D Echocardiogram limited has been performed.  Tamara Santiago 10/03/2021, 1:55 PM

## 2021-10-06 ENCOUNTER — Telehealth: Payer: Self-pay | Admitting: Cardiology

## 2021-10-06 ENCOUNTER — Encounter: Payer: Self-pay | Admitting: *Deleted

## 2021-10-06 ENCOUNTER — Ambulatory Visit: Payer: Medicare HMO | Admitting: Cardiology

## 2021-10-06 ENCOUNTER — Telehealth: Payer: Self-pay | Admitting: *Deleted

## 2021-10-06 DIAGNOSIS — R0602 Shortness of breath: Secondary | ICD-10-CM

## 2021-10-06 NOTE — Telephone Encounter (Signed)
Pt notified and order placed 

## 2021-10-06 NOTE — Telephone Encounter (Signed)
Returned call to patient with Echo results

## 2021-10-06 NOTE — Telephone Encounter (Signed)
Patient returning call for echo results. 

## 2021-10-06 NOTE — Telephone Encounter (Signed)
-----   Message from Jonelle Sidle, MD sent at 10/03/2021  7:34 PM EST ----- Results reviewed.  LVEF is normal at 55 to 60%, also normal RV contraction.  There is mild to moderate aortic regurgitation which is unlikely to be causing shortness of breath at this point.  Also small pericardial effusion that has decreased in size compared to prior testing and again most likely not symptomatic.  Would suggest that we get a Lexiscan Myoview to assess ischemic etiology for shortness of breath and then make final recommendations from there.

## 2021-10-16 ENCOUNTER — Other Ambulatory Visit (HOSPITAL_COMMUNITY): Payer: Medicare HMO

## 2021-10-23 ENCOUNTER — Encounter (HOSPITAL_COMMUNITY)
Admission: RE | Admit: 2021-10-23 | Discharge: 2021-10-23 | Disposition: A | Discharge status: Home / Self Care | Payer: Medicare HMO | Source: Ambulatory Visit | Attending: Cardiology | Admitting: Cardiology

## 2021-10-23 ENCOUNTER — Other Ambulatory Visit: Payer: Self-pay

## 2021-10-23 ENCOUNTER — Ambulatory Visit (HOSPITAL_COMMUNITY)
Admission: RE | Admit: 2021-10-23 | Discharge: 2021-10-23 | Disposition: A | Discharge status: Home / Self Care | Payer: Medicare HMO | Source: Ambulatory Visit | Attending: Cardiology | Admitting: Cardiology

## 2021-10-23 DIAGNOSIS — R0602 Shortness of breath: Secondary | ICD-10-CM | POA: Insufficient documentation

## 2021-10-23 LAB — NM MYOCAR MULTI W/SPECT W/WALL MOTION / EF
Base ST Depression (mm): 0 mm
LV dias vol: 60 mL (ref 46–106)
LV sys vol: 23 mL
Nuc Stress EF: 63 %
Peak HR: 76 {beats}/min
RATE: 0.5
Rest HR: 63 {beats}/min
Rest Nuclear Isotope Dose: 10.4 mCi
SDS: 0
SRS: 1
SSS: 1
ST Depression (mm): 0 mm
Stress Nuclear Isotope Dose: 30 mCi
TID: 1.47

## 2021-10-23 MED ORDER — SODIUM CHLORIDE FLUSH 0.9 % IV SOLN
INTRAVENOUS | Status: AC
  Administered 2021-10-23: 10 mL via INTRAVENOUS
  Filled 2021-10-23: qty 10

## 2021-10-23 MED ORDER — TECHNETIUM TC 99M TETROFOSMIN IV KIT
10.0000 | PACK | Freq: Once | INTRAVENOUS | Status: AC | PRN
  Administered 2021-10-23: 10.4 via INTRAVENOUS

## 2021-10-23 MED ORDER — TECHNETIUM TC 99M TETROFOSMIN IV KIT
30.0000 | PACK | Freq: Once | INTRAVENOUS | Status: AC | PRN
  Administered 2021-10-23: 30 via INTRAVENOUS

## 2021-10-23 MED ORDER — REGADENOSON 0.4 MG/5ML IV SOLN
INTRAVENOUS | Status: AC
  Administered 2021-10-23: 0.4 mg via INTRAVENOUS
  Filled 2021-10-23: qty 5

## 2021-11-22 DIAGNOSIS — Z79899 Other long term (current) drug therapy: Secondary | ICD-10-CM | POA: Diagnosis not present

## 2021-11-27 DIAGNOSIS — F5104 Psychophysiologic insomnia: Secondary | ICD-10-CM | POA: Diagnosis not present

## 2021-11-27 DIAGNOSIS — I951 Orthostatic hypotension: Secondary | ICD-10-CM | POA: Diagnosis not present

## 2021-11-27 DIAGNOSIS — R42 Dizziness and giddiness: Secondary | ICD-10-CM | POA: Diagnosis not present

## 2021-11-27 DIAGNOSIS — E1143 Type 2 diabetes mellitus with diabetic autonomic (poly)neuropathy: Secondary | ICD-10-CM | POA: Diagnosis not present

## 2021-11-27 DIAGNOSIS — R296 Repeated falls: Secondary | ICD-10-CM | POA: Diagnosis not present

## 2021-11-27 DIAGNOSIS — F3341 Major depressive disorder, recurrent, in partial remission: Secondary | ICD-10-CM | POA: Diagnosis not present

## 2021-11-27 DIAGNOSIS — Z79899 Other long term (current) drug therapy: Secondary | ICD-10-CM | POA: Diagnosis not present

## 2021-11-27 DIAGNOSIS — E538 Deficiency of other specified B group vitamins: Secondary | ICD-10-CM | POA: Diagnosis not present

## 2021-12-20 DIAGNOSIS — F5104 Psychophysiologic insomnia: Secondary | ICD-10-CM | POA: Diagnosis not present

## 2021-12-20 DIAGNOSIS — Z79899 Other long term (current) drug therapy: Secondary | ICD-10-CM | POA: Diagnosis not present

## 2021-12-20 DIAGNOSIS — E1169 Type 2 diabetes mellitus with other specified complication: Secondary | ICD-10-CM | POA: Diagnosis not present

## 2021-12-20 DIAGNOSIS — I951 Orthostatic hypotension: Secondary | ICD-10-CM | POA: Diagnosis not present

## 2021-12-20 DIAGNOSIS — E1143 Type 2 diabetes mellitus with diabetic autonomic (poly)neuropathy: Secondary | ICD-10-CM | POA: Diagnosis not present

## 2021-12-20 DIAGNOSIS — R42 Dizziness and giddiness: Secondary | ICD-10-CM | POA: Diagnosis not present

## 2021-12-20 DIAGNOSIS — E538 Deficiency of other specified B group vitamins: Secondary | ICD-10-CM | POA: Diagnosis not present

## 2021-12-20 DIAGNOSIS — R296 Repeated falls: Secondary | ICD-10-CM | POA: Diagnosis not present

## 2021-12-20 DIAGNOSIS — F3341 Major depressive disorder, recurrent, in partial remission: Secondary | ICD-10-CM | POA: Diagnosis not present

## 2021-12-20 DIAGNOSIS — E782 Mixed hyperlipidemia: Secondary | ICD-10-CM | POA: Diagnosis not present

## 2021-12-26 DIAGNOSIS — M4854XS Collapsed vertebra, not elsewhere classified, thoracic region, sequela of fracture: Secondary | ICD-10-CM | POA: Diagnosis not present

## 2021-12-26 DIAGNOSIS — E1169 Type 2 diabetes mellitus with other specified complication: Secondary | ICD-10-CM | POA: Diagnosis not present

## 2021-12-26 DIAGNOSIS — E782 Mixed hyperlipidemia: Secondary | ICD-10-CM | POA: Diagnosis not present

## 2021-12-26 DIAGNOSIS — H9190 Unspecified hearing loss, unspecified ear: Secondary | ICD-10-CM | POA: Diagnosis not present

## 2021-12-26 DIAGNOSIS — J309 Allergic rhinitis, unspecified: Secondary | ICD-10-CM | POA: Diagnosis not present

## 2021-12-26 DIAGNOSIS — F17200 Nicotine dependence, unspecified, uncomplicated: Secondary | ICD-10-CM | POA: Diagnosis not present

## 2021-12-26 DIAGNOSIS — M8580 Other specified disorders of bone density and structure, unspecified site: Secondary | ICD-10-CM | POA: Diagnosis not present

## 2021-12-26 DIAGNOSIS — F0390 Unspecified dementia without behavioral disturbance: Secondary | ICD-10-CM | POA: Diagnosis not present

## 2021-12-26 DIAGNOSIS — I517 Cardiomegaly: Secondary | ICD-10-CM | POA: Diagnosis not present

## 2021-12-26 DIAGNOSIS — I10 Essential (primary) hypertension: Secondary | ICD-10-CM | POA: Diagnosis not present

## 2021-12-26 DIAGNOSIS — K219 Gastro-esophageal reflux disease without esophagitis: Secondary | ICD-10-CM | POA: Diagnosis not present

## 2021-12-26 DIAGNOSIS — R42 Dizziness and giddiness: Secondary | ICD-10-CM | POA: Diagnosis not present

## 2021-12-27 ENCOUNTER — Other Ambulatory Visit (HOSPITAL_COMMUNITY): Payer: Self-pay | Admitting: Internal Medicine

## 2021-12-27 ENCOUNTER — Other Ambulatory Visit: Payer: Self-pay | Admitting: Internal Medicine

## 2021-12-27 DIAGNOSIS — R634 Abnormal weight loss: Secondary | ICD-10-CM

## 2021-12-27 DIAGNOSIS — R0602 Shortness of breath: Secondary | ICD-10-CM

## 2021-12-27 DIAGNOSIS — R059 Cough, unspecified: Secondary | ICD-10-CM

## 2022-01-02 DIAGNOSIS — E119 Type 2 diabetes mellitus without complications: Secondary | ICD-10-CM | POA: Diagnosis not present

## 2022-01-02 DIAGNOSIS — I10 Essential (primary) hypertension: Secondary | ICD-10-CM | POA: Diagnosis not present

## 2022-01-22 DIAGNOSIS — R296 Repeated falls: Secondary | ICD-10-CM | POA: Diagnosis not present

## 2022-01-22 DIAGNOSIS — E538 Deficiency of other specified B group vitamins: Secondary | ICD-10-CM | POA: Diagnosis not present

## 2022-01-22 DIAGNOSIS — E1143 Type 2 diabetes mellitus with diabetic autonomic (poly)neuropathy: Secondary | ICD-10-CM | POA: Diagnosis not present

## 2022-01-22 DIAGNOSIS — I951 Orthostatic hypotension: Secondary | ICD-10-CM | POA: Diagnosis not present

## 2022-01-30 DIAGNOSIS — F03918 Unspecified dementia, unspecified severity, with other behavioral disturbance: Secondary | ICD-10-CM | POA: Diagnosis not present

## 2022-02-09 ENCOUNTER — Ambulatory Visit (HOSPITAL_COMMUNITY): Payer: Medicare HMO

## 2022-02-09 ENCOUNTER — Encounter (HOSPITAL_COMMUNITY): Payer: Self-pay

## 2022-03-04 DIAGNOSIS — I10 Essential (primary) hypertension: Secondary | ICD-10-CM | POA: Diagnosis not present

## 2022-03-04 DIAGNOSIS — E1169 Type 2 diabetes mellitus with other specified complication: Secondary | ICD-10-CM | POA: Diagnosis not present

## 2022-03-04 DIAGNOSIS — K219 Gastro-esophageal reflux disease without esophagitis: Secondary | ICD-10-CM | POA: Diagnosis not present

## 2022-03-04 DIAGNOSIS — E785 Hyperlipidemia, unspecified: Secondary | ICD-10-CM | POA: Diagnosis not present

## 2022-03-29 DIAGNOSIS — E782 Mixed hyperlipidemia: Secondary | ICD-10-CM | POA: Diagnosis not present

## 2022-03-29 DIAGNOSIS — E538 Deficiency of other specified B group vitamins: Secondary | ICD-10-CM | POA: Diagnosis not present

## 2022-03-29 DIAGNOSIS — R634 Abnormal weight loss: Secondary | ICD-10-CM | POA: Diagnosis not present

## 2022-03-29 DIAGNOSIS — E1169 Type 2 diabetes mellitus with other specified complication: Secondary | ICD-10-CM | POA: Diagnosis not present

## 2022-03-29 DIAGNOSIS — M8580 Other specified disorders of bone density and structure, unspecified site: Secondary | ICD-10-CM | POA: Diagnosis not present

## 2022-04-03 DIAGNOSIS — I10 Essential (primary) hypertension: Secondary | ICD-10-CM | POA: Diagnosis not present

## 2022-04-03 DIAGNOSIS — K219 Gastro-esophageal reflux disease without esophagitis: Secondary | ICD-10-CM | POA: Diagnosis not present

## 2022-04-03 DIAGNOSIS — Z79899 Other long term (current) drug therapy: Secondary | ICD-10-CM | POA: Diagnosis not present

## 2022-04-03 DIAGNOSIS — I951 Orthostatic hypotension: Secondary | ICD-10-CM | POA: Diagnosis not present

## 2022-04-03 DIAGNOSIS — E1143 Type 2 diabetes mellitus with diabetic autonomic (poly)neuropathy: Secondary | ICD-10-CM | POA: Diagnosis not present

## 2022-04-03 DIAGNOSIS — E785 Hyperlipidemia, unspecified: Secondary | ICD-10-CM | POA: Diagnosis not present

## 2022-04-03 DIAGNOSIS — E1169 Type 2 diabetes mellitus with other specified complication: Secondary | ICD-10-CM | POA: Diagnosis not present

## 2022-04-03 DIAGNOSIS — E538 Deficiency of other specified B group vitamins: Secondary | ICD-10-CM | POA: Diagnosis not present

## 2022-04-03 DIAGNOSIS — R296 Repeated falls: Secondary | ICD-10-CM | POA: Diagnosis not present

## 2022-04-04 DIAGNOSIS — F17200 Nicotine dependence, unspecified, uncomplicated: Secondary | ICD-10-CM | POA: Diagnosis not present

## 2022-04-04 DIAGNOSIS — K219 Gastro-esophageal reflux disease without esophagitis: Secondary | ICD-10-CM | POA: Diagnosis not present

## 2022-04-04 DIAGNOSIS — H9190 Unspecified hearing loss, unspecified ear: Secondary | ICD-10-CM | POA: Diagnosis not present

## 2022-04-04 DIAGNOSIS — E1169 Type 2 diabetes mellitus with other specified complication: Secondary | ICD-10-CM | POA: Diagnosis not present

## 2022-04-04 DIAGNOSIS — F0390 Unspecified dementia without behavioral disturbance: Secondary | ICD-10-CM | POA: Diagnosis not present

## 2022-04-04 DIAGNOSIS — M4854XS Collapsed vertebra, not elsewhere classified, thoracic region, sequela of fracture: Secondary | ICD-10-CM | POA: Diagnosis not present

## 2022-04-04 DIAGNOSIS — E782 Mixed hyperlipidemia: Secondary | ICD-10-CM | POA: Diagnosis not present

## 2022-04-04 DIAGNOSIS — R42 Dizziness and giddiness: Secondary | ICD-10-CM | POA: Diagnosis not present

## 2022-04-04 DIAGNOSIS — I517 Cardiomegaly: Secondary | ICD-10-CM | POA: Diagnosis not present

## 2022-04-04 DIAGNOSIS — I10 Essential (primary) hypertension: Secondary | ICD-10-CM | POA: Diagnosis not present

## 2022-04-04 DIAGNOSIS — J309 Allergic rhinitis, unspecified: Secondary | ICD-10-CM | POA: Diagnosis not present

## 2022-04-04 DIAGNOSIS — M8580 Other specified disorders of bone density and structure, unspecified site: Secondary | ICD-10-CM | POA: Diagnosis not present

## 2022-04-30 DIAGNOSIS — E1143 Type 2 diabetes mellitus with diabetic autonomic (poly)neuropathy: Secondary | ICD-10-CM | POA: Diagnosis not present

## 2022-05-04 DIAGNOSIS — I10 Essential (primary) hypertension: Secondary | ICD-10-CM | POA: Diagnosis not present

## 2022-05-04 DIAGNOSIS — E1169 Type 2 diabetes mellitus with other specified complication: Secondary | ICD-10-CM | POA: Diagnosis not present

## 2022-05-04 DIAGNOSIS — K219 Gastro-esophageal reflux disease without esophagitis: Secondary | ICD-10-CM | POA: Diagnosis not present

## 2022-05-04 DIAGNOSIS — E785 Hyperlipidemia, unspecified: Secondary | ICD-10-CM | POA: Diagnosis not present

## 2022-06-03 DIAGNOSIS — I10 Essential (primary) hypertension: Secondary | ICD-10-CM | POA: Diagnosis not present

## 2022-06-03 DIAGNOSIS — E785 Hyperlipidemia, unspecified: Secondary | ICD-10-CM | POA: Diagnosis not present

## 2022-06-03 DIAGNOSIS — E1169 Type 2 diabetes mellitus with other specified complication: Secondary | ICD-10-CM | POA: Diagnosis not present

## 2022-06-04 DIAGNOSIS — I10 Essential (primary) hypertension: Secondary | ICD-10-CM | POA: Diagnosis not present

## 2022-06-07 DIAGNOSIS — I10 Essential (primary) hypertension: Secondary | ICD-10-CM | POA: Diagnosis not present

## 2022-06-25 DIAGNOSIS — R296 Repeated falls: Secondary | ICD-10-CM | POA: Diagnosis not present

## 2022-06-25 DIAGNOSIS — Z79899 Other long term (current) drug therapy: Secondary | ICD-10-CM | POA: Diagnosis not present

## 2022-06-25 DIAGNOSIS — E1143 Type 2 diabetes mellitus with diabetic autonomic (poly)neuropathy: Secondary | ICD-10-CM | POA: Diagnosis not present

## 2022-06-25 DIAGNOSIS — E538 Deficiency of other specified B group vitamins: Secondary | ICD-10-CM | POA: Diagnosis not present

## 2022-06-25 DIAGNOSIS — I951 Orthostatic hypotension: Secondary | ICD-10-CM | POA: Diagnosis not present

## 2022-07-05 DIAGNOSIS — E1169 Type 2 diabetes mellitus with other specified complication: Secondary | ICD-10-CM | POA: Diagnosis not present

## 2022-07-05 DIAGNOSIS — K219 Gastro-esophageal reflux disease without esophagitis: Secondary | ICD-10-CM | POA: Diagnosis not present

## 2022-07-05 DIAGNOSIS — E785 Hyperlipidemia, unspecified: Secondary | ICD-10-CM | POA: Diagnosis not present

## 2022-07-05 DIAGNOSIS — I10 Essential (primary) hypertension: Secondary | ICD-10-CM | POA: Diagnosis not present

## 2022-07-27 DIAGNOSIS — E538 Deficiency of other specified B group vitamins: Secondary | ICD-10-CM | POA: Diagnosis not present

## 2022-07-27 DIAGNOSIS — E782 Mixed hyperlipidemia: Secondary | ICD-10-CM | POA: Diagnosis not present

## 2022-07-27 DIAGNOSIS — E1169 Type 2 diabetes mellitus with other specified complication: Secondary | ICD-10-CM | POA: Diagnosis not present

## 2022-08-03 ENCOUNTER — Other Ambulatory Visit (HOSPITAL_COMMUNITY): Payer: Self-pay | Admitting: Internal Medicine

## 2022-08-03 DIAGNOSIS — K219 Gastro-esophageal reflux disease without esophagitis: Secondary | ICD-10-CM | POA: Diagnosis not present

## 2022-08-03 DIAGNOSIS — Z0000 Encounter for general adult medical examination without abnormal findings: Secondary | ICD-10-CM | POA: Diagnosis not present

## 2022-08-03 DIAGNOSIS — M4854XS Collapsed vertebra, not elsewhere classified, thoracic region, sequela of fracture: Secondary | ICD-10-CM | POA: Diagnosis not present

## 2022-08-03 DIAGNOSIS — F0390 Unspecified dementia without behavioral disturbance: Secondary | ICD-10-CM | POA: Diagnosis not present

## 2022-08-03 DIAGNOSIS — Z1231 Encounter for screening mammogram for malignant neoplasm of breast: Secondary | ICD-10-CM

## 2022-08-03 DIAGNOSIS — Z23 Encounter for immunization: Secondary | ICD-10-CM | POA: Diagnosis not present

## 2022-08-03 DIAGNOSIS — E782 Mixed hyperlipidemia: Secondary | ICD-10-CM | POA: Diagnosis not present

## 2022-08-03 DIAGNOSIS — I10 Essential (primary) hypertension: Secondary | ICD-10-CM | POA: Diagnosis not present

## 2022-08-03 DIAGNOSIS — R000 Tachycardia, unspecified: Secondary | ICD-10-CM | POA: Diagnosis not present

## 2022-08-03 DIAGNOSIS — F17200 Nicotine dependence, unspecified, uncomplicated: Secondary | ICD-10-CM | POA: Diagnosis not present

## 2022-08-03 DIAGNOSIS — E1169 Type 2 diabetes mellitus with other specified complication: Secondary | ICD-10-CM | POA: Diagnosis not present

## 2022-08-03 DIAGNOSIS — I517 Cardiomegaly: Secondary | ICD-10-CM | POA: Diagnosis not present

## 2022-08-03 DIAGNOSIS — J309 Allergic rhinitis, unspecified: Secondary | ICD-10-CM | POA: Diagnosis not present

## 2022-08-04 DIAGNOSIS — E1169 Type 2 diabetes mellitus with other specified complication: Secondary | ICD-10-CM | POA: Diagnosis not present

## 2022-08-04 DIAGNOSIS — I10 Essential (primary) hypertension: Secondary | ICD-10-CM | POA: Diagnosis not present

## 2022-08-04 DIAGNOSIS — K219 Gastro-esophageal reflux disease without esophagitis: Secondary | ICD-10-CM | POA: Diagnosis not present

## 2022-08-04 DIAGNOSIS — E785 Hyperlipidemia, unspecified: Secondary | ICD-10-CM | POA: Diagnosis not present

## 2022-08-15 ENCOUNTER — Ambulatory Visit (HOSPITAL_COMMUNITY)
Admission: RE | Admit: 2022-08-15 | Discharge: 2022-08-15 | Disposition: A | Discharge status: Home / Self Care | Payer: Medicare HMO | Source: Ambulatory Visit | Attending: Internal Medicine | Admitting: Internal Medicine

## 2022-08-15 DIAGNOSIS — Z1231 Encounter for screening mammogram for malignant neoplasm of breast: Secondary | ICD-10-CM | POA: Diagnosis not present

## 2022-08-16 ENCOUNTER — Other Ambulatory Visit (HOSPITAL_COMMUNITY): Payer: Self-pay | Admitting: Internal Medicine

## 2022-08-20 ENCOUNTER — Other Ambulatory Visit (HOSPITAL_COMMUNITY): Payer: Self-pay | Admitting: Internal Medicine

## 2022-08-20 DIAGNOSIS — R928 Other abnormal and inconclusive findings on diagnostic imaging of breast: Secondary | ICD-10-CM

## 2022-08-23 ENCOUNTER — Inpatient Hospital Stay (HOSPITAL_COMMUNITY): Admission: RE | Admit: 2022-08-23 | Payer: Medicare HMO | Source: Ambulatory Visit

## 2022-08-23 ENCOUNTER — Ambulatory Visit (HOSPITAL_COMMUNITY): Payer: Medicare HMO

## 2022-08-30 ENCOUNTER — Ambulatory Visit (HOSPITAL_COMMUNITY)
Admission: RE | Admit: 2022-08-30 | Discharge: 2022-08-30 | Disposition: A | Discharge status: Home / Self Care | Payer: Medicare HMO | Source: Ambulatory Visit | Attending: Internal Medicine | Admitting: Internal Medicine

## 2022-08-30 DIAGNOSIS — R921 Mammographic calcification found on diagnostic imaging of breast: Secondary | ICD-10-CM | POA: Diagnosis not present

## 2022-08-30 DIAGNOSIS — R928 Other abnormal and inconclusive findings on diagnostic imaging of breast: Secondary | ICD-10-CM | POA: Insufficient documentation

## 2022-09-03 DIAGNOSIS — R519 Headache, unspecified: Secondary | ICD-10-CM | POA: Diagnosis not present

## 2022-09-03 DIAGNOSIS — E669 Obesity, unspecified: Secondary | ICD-10-CM | POA: Diagnosis not present

## 2022-09-03 DIAGNOSIS — I10 Essential (primary) hypertension: Secondary | ICD-10-CM | POA: Diagnosis not present

## 2022-09-11 DIAGNOSIS — Z79899 Other long term (current) drug therapy: Secondary | ICD-10-CM | POA: Diagnosis not present

## 2022-09-11 DIAGNOSIS — E1143 Type 2 diabetes mellitus with diabetic autonomic (poly)neuropathy: Secondary | ICD-10-CM | POA: Diagnosis not present

## 2022-09-11 DIAGNOSIS — I951 Orthostatic hypotension: Secondary | ICD-10-CM | POA: Diagnosis not present

## 2022-09-11 DIAGNOSIS — R296 Repeated falls: Secondary | ICD-10-CM | POA: Diagnosis not present

## 2022-11-13 ENCOUNTER — Ambulatory Visit: Payer: Medicare HMO | Admitting: Diagnostic Neuroimaging

## 2022-11-28 DIAGNOSIS — E782 Mixed hyperlipidemia: Secondary | ICD-10-CM | POA: Diagnosis not present

## 2022-11-28 DIAGNOSIS — E1169 Type 2 diabetes mellitus with other specified complication: Secondary | ICD-10-CM | POA: Diagnosis not present

## 2022-11-28 DIAGNOSIS — E538 Deficiency of other specified B group vitamins: Secondary | ICD-10-CM | POA: Diagnosis not present

## 2022-12-04 DIAGNOSIS — I119 Hypertensive heart disease without heart failure: Secondary | ICD-10-CM | POA: Diagnosis not present

## 2022-12-04 DIAGNOSIS — F5104 Psychophysiologic insomnia: Secondary | ICD-10-CM | POA: Diagnosis not present

## 2022-12-04 DIAGNOSIS — K219 Gastro-esophageal reflux disease without esophagitis: Secondary | ICD-10-CM | POA: Diagnosis not present

## 2022-12-04 DIAGNOSIS — E669 Obesity, unspecified: Secondary | ICD-10-CM | POA: Diagnosis not present

## 2022-12-04 DIAGNOSIS — E1169 Type 2 diabetes mellitus with other specified complication: Secondary | ICD-10-CM | POA: Diagnosis not present

## 2022-12-04 DIAGNOSIS — E782 Mixed hyperlipidemia: Secondary | ICD-10-CM | POA: Diagnosis not present

## 2022-12-04 DIAGNOSIS — F331 Major depressive disorder, recurrent, moderate: Secondary | ICD-10-CM | POA: Diagnosis not present

## 2022-12-04 DIAGNOSIS — H9190 Unspecified hearing loss, unspecified ear: Secondary | ICD-10-CM | POA: Diagnosis not present

## 2022-12-04 DIAGNOSIS — I10 Essential (primary) hypertension: Secondary | ICD-10-CM | POA: Diagnosis not present

## 2022-12-04 DIAGNOSIS — F0390 Unspecified dementia without behavioral disturbance: Secondary | ICD-10-CM | POA: Diagnosis not present

## 2022-12-04 DIAGNOSIS — E538 Deficiency of other specified B group vitamins: Secondary | ICD-10-CM | POA: Diagnosis not present

## 2022-12-04 DIAGNOSIS — E785 Hyperlipidemia, unspecified: Secondary | ICD-10-CM | POA: Diagnosis not present

## 2022-12-07 DIAGNOSIS — E538 Deficiency of other specified B group vitamins: Secondary | ICD-10-CM | POA: Diagnosis not present

## 2022-12-31 ENCOUNTER — Ambulatory Visit (INDEPENDENT_AMBULATORY_CARE_PROVIDER_SITE_OTHER): Payer: Medicare HMO | Admitting: Diagnostic Neuroimaging

## 2022-12-31 ENCOUNTER — Encounter: Payer: Self-pay | Admitting: Diagnostic Neuroimaging

## 2022-12-31 VITALS — BP 140/83 | HR 92 | Ht 64.0 in | Wt 174.0 lb

## 2022-12-31 DIAGNOSIS — R269 Unspecified abnormalities of gait and mobility: Secondary | ICD-10-CM | POA: Diagnosis not present

## 2022-12-31 NOTE — Patient Instructions (Signed)
GAIT DIFF (prior dx of B12 deficiency, diabetic neuropathy; also deconditioning, age related decline, arthritis) - refer to PT evaluation  ANXIETY, DEPRESSION, INSOMNIA - follow up with PCP; consider psychiatry / psychology evaluation  ACTIVITY/FITNESS Mental, social, emotional and physical stimulation are very important for brain and body health. Try learning a new activity (arts, music, language, sports, games).  Keep moving your body to the best of your abilities. You can do this at home, inside or outside, the park, community center, gym or anywhere you like. Consider a physical therapist or personal trainer to get started. Fitness trackers, smart-watches or  smart-phones can help as well.  NUTRITION Eat more plants: colorful vegetables, nuts, seeds and berries.  Eat less sugar, salt, preservatives and processed foods.  Drink water when you are thirsty. Warm water with a slice of lemon is an excellent morning drink to start the day.  SLEEP Try to get at least 7-8+ hours sleep per day. Regular exercise and reduced caffeine will help you sleep better. Practice good sleep hygeine techniques.

## 2022-12-31 NOTE — Progress Notes (Signed)
[facility]  PATIENT: Tamara Santiago DOB: 08/28/1951  REFERRING CLINICIAN: Phillips Odor, MD HISTORY FROM: patient and daughter REASON FOR VISIT: new consult   HISTORICAL  CHIEF COMPLAINT:  Chief Complaint  Patient presents with   New Patient (Initial Visit)    Patient in room #7 with her daughter. Pt here today for transfer of care for her dizziness and falls.    HISTORY OF PRESENT ILLNESS:   72 year old female here for evaluation of gait and balance difficulty.  December 2021 patient down when walking outside.  She ended up injuring her back.  She was having some intermittent dizzy spells and possible vertigo symptoms at that time.  She went to the ER for evaluation.  She followed up with neurologist in [place].  She was diagnosed with B12 deficiency and diabetic neuropathies.  Also concern for medication side effect was raised.  Amitriptyline was discontinued.  Balance issues did not improve.  In fact anxiety depression worsened.  Now on imipramine and escitalopram but anxiety and insomnia are worse.  Nowadays having some intermittent gait and balance difficulties.  No major vertigo currently.  No weakness or headaches.  Some back pain, hip and leg pain.    REVIEW OF SYSTEMS: Full 14 system review of systems performed and negative with exception of: As per HPI.  ALLERGIES: Allergies  Allergen Reactions   Ambien [Zolpidem]     HOME MEDICATIONS: Outpatient Medications Prior to Visit  Medication Sig Dispense Refill   albuterol (VENTOLIN HFA) 108 (90 Base) MCG/ACT inhaler      aspirin EC 81 MG tablet Take 81 mg by mouth daily.     escitalopram (LEXAPRO) 10 MG tablet Take 10 mg by mouth daily.     hydrochlorothiazide (HYDRODIURIL) 25 MG tablet Take 25 mg by mouth daily.     hydrochlorothiazide (MICROZIDE) 12.5 MG capsule Take 12.5 mg by mouth daily.     imipramine (TOFRANIL) 25 MG tablet Take 25 mg by mouth at bedtime.     loratadine (CLARITIN) 10 MG  tablet Take 10 mg by mouth daily.     metoprolol tartrate (LOPRESSOR) 50 MG tablet Take 50 mg by mouth daily.     omeprazole (PRILOSEC) 40 MG capsule Take 40 mg by mouth daily.     rosuvastatin (CRESTOR) 10 MG tablet Take 10 mg by mouth daily.     Vitamin D, Cholecalciferol, 1000 units TABS Take 1 tablet by mouth daily.     donepezil (ARICEPT) 10 MG tablet Take 10 mg by mouth at bedtime.     meclizine (ANTIVERT) 12.5 MG tablet Take 1 tablet (12.5 mg total) by mouth 3 (three) times daily as needed for dizziness. 30 tablet 0   potassium chloride (MICRO-K) 10 MEQ CR capsule Take 10 mEq by mouth 2 (two) times daily. (Patient not taking: Reported on 12/31/2022)     potassium chloride SA (K-DUR,KLOR-CON) 20 MEQ tablet Take 2 tablets (40 mEq total) by mouth daily. 8 tablet 0   amitriptyline (ELAVIL) 100 MG tablet Take 100 mg by mouth at bedtime. (Patient not taking: Reported on 12/31/2022)     HYDROcodone-acetaminophen (NORCO/VICODIN) 5-325 MG tablet Take 1 tablet by mouth every 4 (four) hours as needed. (Patient not taking: Reported on 12/31/2022) 10 tablet 0   loperamide (IMODIUM) 2 MG capsule Take 1 capsule (2 mg total) by mouth 4 (four) times daily as needed for diarrhea or loose stools. (Patient not taking: Reported on 12/31/2022) 12 capsule 0   losartan (COZAAR) 50 MG tablet  Take 50 mg by mouth daily. (Patient not taking: Reported on 12/31/2022)     zolpidem (AMBIEN) 10 MG tablet Take 10 mg by mouth at bedtime as needed for sleep. (Patient not taking: Reported on 12/31/2022)     No facility-administered medications prior to visit.    PAST MEDICAL HISTORY: Past Medical History:  Diagnosis Date   Anxiety    Essential hypertension    GERD (gastroesophageal reflux disease)    Hyperlipidemia     PAST SURGICAL HISTORY: Past Surgical History:  Procedure Laterality Date   CHOLECYSTECTOMY N/A 09/24/2018   Procedure: LAPAROSCOPIC CHOLECYSTECTOMY;  Surgeon: Aviva Signs, MD;  Location: [facility];   Service: General;  Laterality: N/A;   TUBAL LIGATION      FAMILY HISTORY: Family History  Problem Relation Age of Onset   Diabetes Mellitus II Mother    Heart disease Mother    Hyperlipidemia Mother     SOCIAL HISTORY: Social History   Socioeconomic History   Marital status: Divorced    Spouse name: Not on file   Number of children: Not on file   Years of education: Not on file   Highest education level: Not on file  Occupational History   Not on file  Tobacco Use   Smoking status: Every Day    Types: Cigarettes   Smokeless tobacco: Never  Substance and Sexual Activity   Alcohol use: No   Drug use: No   Sexual activity: Not on file  Other Topics Concern   Not on file  Social History Narrative   Not on file   Social Determinants of Health   Financial Resource Strain: Not on file  Food Insecurity: Not on file  Transportation Needs: Not on file  Physical Activity: Not on file  Stress: Not on file  Social Connections: Not on file  Intimate Partner Violence: Not on file     PHYSICAL EXAM  GENERAL EXAM/CONSTITUTIONAL: Vitals:  Vitals:   12/31/22 1132  BP: (!) 140/83  Pulse: 92  Weight: 174 lb (78.9 kg)  Height: '5\' 4"'$  (1.626 m)   Body mass index is 29.87 kg/m. Wt Readings from Last 3 Encounters:  12/31/22 174 lb (78.9 kg)  02/27/21 198 lb 12.8 oz (90.2 kg)  09/30/18 175 lb 3.2 oz (79.5 kg)   Patient is in no distress; well developed, nourished and groomed; neck is supple  CARDIOVASCULAR: Examination of carotid arteries is normal; no carotid bruits Regular rate and rhythm, no murmurs Examination of peripheral vascular system by observation and palpation is normal  EYES: Ophthalmoscopic exam of optic discs and posterior segments is normal; no papilledema or hemorrhages No results found.  MUSCULOSKELETAL: Gait, strength, tone, movements noted in Neurologic exam below  NEUROLOGIC: MENTAL STATUS:      No data to display         awake, alert,  oriented to person, place and time recent and remote memory intact normal attention and concentration language fluent, comprehension intact, naming intact fund of knowledge appropriate  CRANIAL NERVE:  2nd - no papilledema on fundoscopic exam 2nd, 3rd, 4th, 6th - pupils equal and reactive to light, visual fields full to confrontation, extraocular muscles intact, no nystagmus 5th - facial sensation symmetric 7th - facial strength symmetric 8th - hearing intact 9th - palate elevates symmetrically, uvula midline 11th - shoulder shrug symmetric 12th - tongue protrusion midline  MOTOR:  normal bulk and tone, full strength in the BUE, BLE  SENSORY:  normal and symmetric to light touch, temperature,  vibration  COORDINATION:  finger-nose-finger, fine finger movements normal  REFLEXES:  deep tendon reflexes 1+ and symmetric  GAIT/STATION:  narrow based gait; STOOPED POSTURE; SHORT STEPS     DIAGNOSTIC DATA (LABS, IMAGING, TESTING) - I reviewed patient records, labs, notes, testing and imaging myself where available.  Lab Results  Component Value Date   WBC 12.0 (H) 09/27/2018   HGB 10.1 (L) 09/27/2018   HCT 31.5 (L) 09/27/2018   MCV 92.6 09/27/2018   PLT 366 09/27/2018      Component Value Date/Time   NA 137 09/26/2018 0523   K 3.6 09/26/2018 0523   CL 104 09/26/2018 0523   CO2 26 09/26/2018 0523   GLUCOSE 102 (H) 09/26/2018 0523   BUN 8 09/26/2018 0523   CREATININE 0.86 09/26/2018 0523   CALCIUM 8.0 (L) 09/26/2018 0523   PROT 6.1 (L) 09/26/2018 0523   ALBUMIN 2.1 (L) 09/26/2018 0523   AST 13 (L) 09/26/2018 0523   ALT 17 09/26/2018 0523   ALKPHOS 66 09/26/2018 0523   BILITOT 1.0 09/26/2018 0523   GFRNONAA >60 09/26/2018 0523   GFRAA >60 09/26/2018 0523   No results found for: "CHOL", "HDL", "LDLCALC", "LDLDIRECT", "TRIG", "CHOLHDL" No results found for: "HGBA1C" No results found for: "VITAMINB12" No results found for: "TSH"  09/26/21 MRI brain - No  evidence of recent infarction, hemorrhage, or mass. Moderate chronic microvascular ischemic changes.    ASSESSMENT AND PLAN  72 y.o. year old female here with:   Dx:  1. Gait difficulty     PLAN:  GAIT DIFF (prior dx of B12 deficiency, diabetic neuropathy; also deconditioning, age related decline, arthritis) - continue B12 replacement; monitor lab testing, sugar, kidney and liver function - refer to PT evaluation for gait training  ANXIETY, DEPRESSION, INSOMNIA - follow up with PCP; consider psychiatry / psychology evaluation  ACTIVITY/FITNESS Mental, social, emotional and physical stimulation are very important for brain and body health. Try learning a new activity (arts, music, language, sports, games).  Keep moving your body to the best of your abilities. You can do this at home, inside or outside, the park, community center, gym or anywhere you like. Consider a physical therapist or personal trainer to get started. Fitness trackers, smart-watches or  smart-phones can help as well.  NUTRITION Eat more plants: colorful vegetables, nuts, seeds and berries.  Eat less sugar, salt, preservatives and processed foods.  Drink water when you are thirsty. Warm water with a slice of lemon is an excellent morning drink to start the day.  SLEEP Try to get at least 7-8+ hours sleep per day. Regular exercise and reduced caffeine will help you sleep better. Practice good sleep hygeine techniques.   Orders Placed This Encounter  Procedures   Ambulatory referral to Physical Therapy   Return for return to PCP, pending if symptoms worsen or fail to improve.    Penni Bombard, MD [id number], [id number] PM Certified in Neurology, Neurophysiology and Neuroimaging  [facility] [address] [place], [place] [address] [phone]

## 2023-01-11 DIAGNOSIS — E538 Deficiency of other specified B group vitamins: Secondary | ICD-10-CM | POA: Diagnosis not present

## 2023-01-17 ENCOUNTER — Ambulatory Visit (HOSPITAL_COMMUNITY): Payer: Medicare HMO | Attending: Diagnostic Neuroimaging | Admitting: Physical Therapy

## 2023-01-17 ENCOUNTER — Other Ambulatory Visit: Payer: Self-pay

## 2023-01-17 DIAGNOSIS — M6281 Muscle weakness (generalized): Secondary | ICD-10-CM | POA: Insufficient documentation

## 2023-01-17 DIAGNOSIS — R269 Unspecified abnormalities of gait and mobility: Secondary | ICD-10-CM | POA: Diagnosis not present

## 2023-01-17 NOTE — Therapy (Signed)
OUTPATIENT PHYSICAL THERAPY LOWER EXTREMITY EVALUATION   Patient Name: Tamara Santiago MRN: [id number] DOB:[birth year], 72 y.o., female Today's Date: 01/17/2023  END OF SESSION:  PT End of Session - 01/17/23 1119     Visit Number 1    Number of Visits 12    Date for PT Re-Evaluation 02/28/23    Authorization Type [employer]    Progress Note Due on Visit 10    PT Start Time 1030    PT Stop Time 1115    PT Time Calculation (min) 45 min             Past Medical History:  Diagnosis Date   Anxiety    Essential hypertension    GERD (gastroesophageal reflux disease)    Hyperlipidemia    Past Surgical History:  Procedure Laterality Date   CHOLECYSTECTOMY N/A 09/24/2018   Procedure: LAPAROSCOPIC CHOLECYSTECTOMY;  Surgeon: Aviva Signs, MD;  Location: [facility];  Service: General;  Laterality: N/A;   TUBAL LIGATION     Patient Active Problem List   Diagnosis Date Noted   Acute cholecystitis 09/23/2018   Hypokalemia 09/23/2018   Cholangitis 09/23/2018   Sepsis ([place]) 09/23/2018    PCP: Allyn Kenner  REFERRING PROVIDER: Penni Bombard, MD  REFERRING DIAG: R26.9 (ICD-10-CM) - Gait difficulty  THERAPY DIAG:  Gait difficulty  Muscle weakness (generalized)  Rationale for Evaluation and Treatment: Rehabilitation  ONSET DATE: almost 2 years    SUBJECTIVE STATEMENT: Pt states that last year she was falling a lot for no apparent reason.  She is doing better.   She gets dizzy at least once a day which will for about five minutes.  She has had tests which all are negative.  She has not been increasing her activity as directed by the MD due to fear of falling and the fact that she gets tired quickly.  States she has to prep her meal and then rest before she can cook it.   PERTINENT HISTORY: Anxiety, HTN PAIN:  Are you having pain? No  PRECAUTIONS: Fall  WEIGHT BEARING RESTRICTIONS: No  FALLS:  Has patient fallen in last 6 months? Yes. Number of falls 3  LIVING  ENVIRONMENT: Lives with: lives with their family and lives alone Lives in: House/apartment Stairs: No OCCUPATION: retired  PLOF: Independent with community mobility with device  PATIENT GOALS: To be able to walk longer, no falls and have more energy  NEXT MD VISIT: as needed   OBJECTIVE:   DIAGNOSTIC FINDINGS:  Narrative & Impression  CLINICAL DATA:  Altered mental status and multiple falls.   EXAM: MRI HEAD WITHOUT CONTRAST   TECHNIQUE: Multiplanar, multiecho pulse sequences of the brain and surrounding structures were obtained without intravenous contrast.   COMPARISON:  None.   FINDINGS: Brain: There is no acute infarction or intracranial hemorrhage. There is no intracranial mass, mass effect, or edema. There is no hydrocephalus or extra-axial fluid collection. Prominence of the ventricles and sulci reflects minor parenchymal volume loss. Patchy and confluent areas of T2 hyperintensity in the supratentorial and pontine white matter are nonspecific but probably reflect moderate chronic microvascular ischemic changes.   Vascular: Major vessel flow voids at the skull base are preserved.   Skull and upper cervical spine: Normal marrow signal is preserved.   Sinuses/Orbits: Paranasal sinuses are aerated. Orbits are unremarkable.   Other: Sella is partially empty.  Mastoid air cells are clear.   IMPRESSION: No evidence of recent infarction, hemorrhage, or mass. Moderate chronic microvascular ischemic changes.  Electronically Signed   By: Macy Mis M.D.   On: 09/27/2021 15:21     POSTURE: flexed trunk     LOWER EXTREMITY MMT:  MMT Right eval Left eval  Hip flexion 5/5 3-/5  Hip extension 2/5 3-/5  Hip abduction 4/5 5/5  Hip adduction    Hip internal rotation    Hip external rotation    Knee flexion 5/5 3+/5  Knee extension 5/5 5/5  Ankle dorsiflexion 4/5 4/5  Ankle plantarflexion    Ankle inversion    Ankle eversion     (Blank rows =  not tested)  FUNCTIONAL TESTS:  30 seconds chair stand test:  4 (7 is poor for age and sex; 54 is average) 2 minute walk test: 239 feet; slightly veers to the Rt, no ankle motion forward flexed postionl   Single leg stance: RT: 4"  : LT:  25"    TODAY'S TREATMENT:                                                                                                                              DATE:  01/17/23 Eval    Seated Scapular Retraction  10 reps - 3-5" hold - Seated Transversus Abdominis Bracing  10 reps - 3-5" hold - Supine Bridge x 5  PATIENT EDUCATION:  Education details: HEP Person educated: Patient Education method: Theatre stage manager Education comprehension: verbalized understanding  HOME EXERCISE PROGRAM: Access Code: [email or web address] URL: [email or web address] Date: 01/17/2023 Prepared by: Rayetta Humphrey  Exercises - Seated Scapular Retraction  - 3 x daily - 7 x weekly - 1 sets - 10 reps - 3-5" hold - Seated Transversus Abdominis Bracing  - 3 x daily - 7 x weekly - 1 sets - 10 reps - 3-5" hold - Supine Bridge  - 2 x daily - 7 x weekly - 1 sets - 10 reps - 3-5" hold  ASSESSMENT:  CLINICAL IMPRESSION: Patient is a 72 y.o. female who was seen today for physical therapy evaluation and treatment for walking difficulty. Evaluation demonstrates posture dysfunction, decreased strength, decreased activity tolerance, decreased balance which is affecting her mobility.  Mr. Mizerak will benefit from skilled PT to address these issues and maximize her functional ability.   OBJECTIVE IMPAIRMENTS: Abnormal gait, decreased activity tolerance, decreased balance, difficulty walking, decreased strength, dizziness, and postural dysfunction.   ACTIVITY LIMITATIONS: carrying, lifting, squatting, and locomotion level  PARTICIPATION LIMITATIONS: meal prep, cleaning, shopping, and community activity  PERSONAL FACTORS: Behavior pattern, Fitness, and Time since onset of  injury/illness/exacerbation are also affecting patient's functional outcome.   REHAB POTENTIAL: Fair    CLINICAL DECISION MAKING: Stable/uncomplicated  EVALUATION COMPLEXITY: Low   GOALS: Goals reviewed with patient? No  SHORT TERM GOALS: Target date: 02/06/23 PT to be I in HEP to improve functional activity. Baseline: Goal status: INITIAL  2.  PT mm strength to be increased 1/2 grade to allow pt to be able to come from sit to  stand with increased ease from a chair  Baseline:  Goal status: INITIAL  3. PT to be able to stand on RT LE for 10 " for decreased fall risk  Goal status: INITIAL  4.  PT to be able to walk 300 ft in 2 minute period to demonstrate decreased fall risk.  Baseline:  Goal status: INITIAL    LONG TERM GOALS: Target date: 02/28/23  PT to be I  in advanced HEP to improve functional activity. Baseline:  Goal status: INITIAL  2.   PT mm strength to be increased 1 grade to allow pt to be able to come from sit to stand with increased ease from a chair Baseline:  Goal status: INITIAL  3.  PT to be able to ambulate 350 ft in 2 minutes for decreased fall risk  Baseline:  Goal status: INITIAL  4.  PT to be able to prep and cook her meal without having to take a break  Baseline:  Goal status: INITIAL  5.  PT to be able to ambulate without forward flexed position  Baseline:  Goal status: INITIAL  PLAN:  PT FREQUENCY: 2x/week  PT DURATION: 6 weeks  PLANNED INTERVENTIONS: Therapeutic exercises, Therapeutic activity, Balance training, Gait training, Self Care, and Manual therapy  PLAN FOR NEXT SESSION: continue with postural, core and LE strengthening.   Rayetta Humphrey, [place] CLT [phone]  01/17/2023, 11:21 AM

## 2023-01-31 ENCOUNTER — Ambulatory Visit (HOSPITAL_COMMUNITY): Payer: Medicare HMO | Admitting: Physical Therapy

## 2023-01-31 DIAGNOSIS — R269 Unspecified abnormalities of gait and mobility: Secondary | ICD-10-CM

## 2023-01-31 DIAGNOSIS — M6281 Muscle weakness (generalized): Secondary | ICD-10-CM

## 2023-01-31 NOTE — Therapy (Signed)
OUTPATIENT PHYSICAL THERAPY LOWER EXTREMITY Treatment   Patient Name: Tamara Santiago MRN: [id number] DOB:[birth year], 72 y.o., female Today's Date: 01/31/2023  END OF SESSION:  PT End of Session - 01/31/23 1340    Visit Number 2    Number of Visits 12    Date for PT Re-Evaluation 02/28/23    Authorization Type Aetna    Progress Note Due on Visit 10    PT Start Time 1300    PT Stop Time 1340    PT Time Calculation (min) 40 min             Past Medical History:  Diagnosis Date   Anxiety    Essential hypertension    GERD (gastroesophageal reflux disease)    Hyperlipidemia    Past Surgical History:  Procedure Laterality Date   CHOLECYSTECTOMY N/A 09/24/2018   Procedure: LAPAROSCOPIC CHOLECYSTECTOMY;  Surgeon: Aviva Signs, MD;  Location: [facility];  Service: General;  Laterality: N/A;   TUBAL LIGATION     Patient Active Problem List   Diagnosis Date Noted   Acute cholecystitis 09/23/2018   Hypokalemia 09/23/2018   Cholangitis 09/23/2018   Sepsis ([place]) 09/23/2018    PCP: Allyn Kenner  REFERRING PROVIDER: Penni Bombard, MD  REFERRING DIAG: R26.9 (ICD-10-CM) - Gait difficulty  THERAPY DIAG:  Gait difficulty  Muscle weakness (generalized)  Rationale for Evaluation and Treatment: Rehabilitation  ONSET DATE: almost 2 years    SUBJECTIVE STATEMENT: PT states that she has been completing her exercises she is sore and tired.  PERTINENT HISTORY: Anxiety, HTN PAIN:  Are you having pain? No  PRECAUTIONS: Fall  WEIGHT BEARING RESTRICTIONS: No  FALLS:  Has patient fallen in last 6 months? Yes. Number of falls 3    PATIENT GOALS: To be able to walk longer, no falls and have more energy  NEXT MD VISIT: as needed   OBJECTIVE:   DIAGNOSTIC FINDINGS:  Narrative & Impression  CLINICAL DATA:  Altered mental status and multiple falls.   EXAM: MRI HEAD WITHOUT CONTRAST   TECHNIQUE: Multiplanar, multiecho pulse sequences of the brain and  surrounding structures were obtained without intravenous contrast.   COMPARISON:  None.   FINDINGS: Brain: There is no acute infarction or intracranial hemorrhage. There is no intracranial mass, mass effect, or edema. There is no hydrocephalus or extra-axial fluid collection. Prominence of the ventricles and sulci reflects minor parenchymal volume loss. Patchy and confluent areas of T2 hyperintensity in the supratentorial and pontine white matter are nonspecific but probably reflect moderate chronic microvascular ischemic changes.   Vascular: Major vessel flow voids at the skull base are preserved.   Skull and upper cervical spine: Normal marrow signal is preserved.   Sinuses/Orbits: Paranasal sinuses are aerated. Orbits are unremarkable.   Other: Sella is partially empty.  Mastoid air cells are clear.   IMPRESSION: No evidence of recent infarction, hemorrhage, or mass. Moderate chronic microvascular ischemic changes.     Electronically Signed   By: Macy Mis M.D.   On: 09/27/2021 15:21     POSTURE: flexed trunk     LOWER EXTREMITY MMT:  MMT Right eval Left eval  Hip flexion 5/5 3-/5  Hip extension 2/5 3-/5  Hip abduction 4/5 5/5  Hip adduction    Hip internal rotation    Hip external rotation    Knee flexion 5/5 3+/5  Knee extension 5/5 5/5  Ankle dorsiflexion 4/5 4/5  Ankle plantarflexion    Ankle inversion    Ankle eversion     (  Blank rows = not tested)  FUNCTIONAL TESTS:  30 seconds chair stand test:  4 (7 is poor for age and sex; 40 is average) 2 minute walk test: 239 feet; slightly veers to the Rt, no ankle motion forward flexed postionl   Single leg stance: RT: 4"  : LT:  25"    TODAY'S TREATMENT:                                                                                                                              DATE:  01/31/23 Nustep hills level 3 x 5 minutes. Wall arch 10 Squat x 10  Side step x 2 RT  Sit: Sit to stand x 10   LAQ 3# x 10 B  Scapular retraction with B shoulder ER x 10  Supine: SLR B x 10 each Bridge x 10 rest then 5 more  Side lying: Hip abduction x 10 B  Clam x 10 B  01/17/23 Eval    Seated Scapular Retraction  10 reps - 3-5" hold - Seated Transversus Abdominis Bracing  10 reps - 3-5" hold - Supine Bridge x 5  PATIENT EDUCATION:  Education details: HEP Person educated: Patient Education method: Explanation and Handouts Education comprehension: verbalized understanding  HOME EXERCISE PROGRAM:             3/28:            - Heel Raises with Counter Support  - 2 x daily - 7 x weekly - 1 sets - 10 reps - Mini Squat with Counter Support  - 2 x daily - 7 x weekly - 1 sets - 10 reps - Sit to Stand Without Arm Support  - 2 x daily - 7 x weekly - 1 sets - 10 reps - Active Straight Leg Raise with Quad Set  - 2 x daily - 7 x weekly - 1 sets - 10 reps Access Code: [facility] URL: [email or web address] Date: 01/17/2023 Prepared by: Rayetta Humphrey  Exercises - Seated Scapular Retraction  - 3 x daily - 7 x weekly - 1 sets - 10 reps - 3-5" hold - Seated Transversus Abdominis Bracing  - 3 x daily - 7 x weekly - 1 sets - 10 reps - 3-5" hold - Supine Bridge  - 2 x daily - 7 x weekly - 1 sets - 10 reps - 3-5" hold  ASSESSMENT:  CLINICAL IMPRESSION: Evaluation and goals reviewed with pt. PT needs moderate cuing to complete exercises correctly.  Pt continues to  demonstrates posture dysfunction, decreased strength, decreased activity tolerance, decreased balance which is affecting her mobility.  Mr. Czekaj will continue to benefit from skilled PT to address these issues and maximize her functional ability.   OBJECTIVE IMPAIRMENTS: Abnormal gait, decreased activity tolerance, decreased balance, difficulty walking, decreased strength, dizziness, and postural dysfunction.   ACTIVITY LIMITATIONS: carrying, lifting, squatting, and locomotion level  PARTICIPATION LIMITATIONS: meal prep,  cleaning, shopping, and community activity  PERSONAL FACTORS: Behavior pattern, Fitness, and Time since onset of injury/illness/exacerbation are also affecting patient's functional outcome.   REHAB POTENTIAL: Fair    CLINICAL DECISION MAKING: Stable/uncomplicated  EVALUATION COMPLEXITY: Low   GOALS: Goals reviewed with patient? No  SHORT TERM GOALS: Target date: 02/06/23 PT to be I in HEP to improve functional activity. Baseline: Goal status: IN PROGRESS  2.  PT mm strength to be increased 1/2 grade to allow pt to be able to come from sit to stand with increased ease from a chair  Baseline:  Goal status: IN PROGRESS  3. PT to be able to stand on RT LE for 10 " for decreased fall risk  Goal status: IN PROGRESS  4.  PT to be able to walk 300 ft in 2 minute period to demonstrate decreased fall risk.  Baseline:  Goal status: IN PROGRESS    LONG TERM GOALS: Target date: 02/28/23  PT to be I  in advanced HEP to improve functional activity. Baseline:  Goal status: IN PROGRESS  2.   PT mm strength to be increased 1 grade to allow pt to be able to come from sit to stand with increased ease from a chair Baseline:  Goal status: IN PROGRESS  3.  PT to be able to ambulate 350 ft in 2 minutes for decreased fall risk  Baseline:  Goal status: IN PROGRESS  4.  PT to be able to prep and cook her meal without having to take a break  Baseline:  Goal status: IN PROGRESS  5.  PT to be able to ambulate without forward flexed position  Baseline:  Goal status: IN PROGRESS  PLAN:  PT FREQUENCY: 2x/week  PT DURATION: 6 weeks  PLANNED INTERVENTIONS: Therapeutic exercises, Therapeutic activity, Balance training, Gait training, Self Care, and Manual therapy  PLAN FOR NEXT SESSION: continue with postural, core and LE strengthening. Begin balance activity   Rayetta Humphrey, PT CLT [phone]  01/31/2023, 1340

## 2023-02-06 ENCOUNTER — Ambulatory Visit (HOSPITAL_COMMUNITY): Payer: Medicare HMO | Attending: Diagnostic Neuroimaging

## 2023-02-06 DIAGNOSIS — R269 Unspecified abnormalities of gait and mobility: Secondary | ICD-10-CM | POA: Diagnosis not present

## 2023-02-06 DIAGNOSIS — M6281 Muscle weakness (generalized): Secondary | ICD-10-CM | POA: Diagnosis not present

## 2023-02-06 NOTE — Therapy (Addendum)
OUTPATIENT PHYSICAL THERAPY LOWER EXTREMITY Treatment   Patient Name: Tamara Santiago MRN: [id number] DOB:09-27-1951, 72 y.o., female Today's Date: 02/06/2023  END OF SESSION:  02/06/23 1356  PT Visits / Re-Eval  Visit Number 3  Number of Visits 12  Date for PT Re-Evaluation 02/28/23  Authorization  Authorization Type Aetna  Progress Note Due on Visit 10  PT Time Calculation  PT Start Time 1350  PT Stop Time 1430  PT Time Calculation (min) 40 min  PT - End of Session  Equipment Utilized During Treatment Gait belt  Activity Tolerance Patient tolerated treatment well  Behavior During Therapy WFL for tasks assessed/performed     Past Medical History:  Diagnosis Date   Anxiety    Essential hypertension    GERD (gastroesophageal reflux disease)    Hyperlipidemia    Past Surgical History:  Procedure Laterality Date   CHOLECYSTECTOMY N/A 09/24/2018   Procedure: LAPAROSCOPIC CHOLECYSTECTOMY;  Surgeon: Franky Macho, MD;  Location: [facility];  Service: General;  Laterality: N/A;   TUBAL LIGATION     Patient Active Problem List   Diagnosis Date Noted   Acute cholecystitis 09/23/2018   Hypokalemia 09/23/2018   Cholangitis 09/23/2018   Sepsis 09/23/2018    PCP: Nita Sells  REFERRING PROVIDER: Suanne Marker, MD  REFERRING DIAG: R26.9 (ICD-10-CM) - Gait difficulty  THERAPY DIAG:  No diagnosis found.  Rationale for Evaluation and Treatment: Rehabilitation  ONSET DATE: almost 2 years    SUBJECTIVE STATEMENT: Patient states that her back will hurt when she starts moving around (8/10). Denies any recent falls.  PERTINENT HISTORY: Anxiety, HTN PAIN:  Are you having pain? No  PRECAUTIONS: Fall  WEIGHT BEARING RESTRICTIONS: No  FALLS:  Has patient fallen in last 6 months? Yes. Number of falls 3    PATIENT GOALS: To be able to walk longer, no falls and have more energy  NEXT MD VISIT: as needed   OBJECTIVE:   DIAGNOSTIC FINDINGS:  Narrative & Impression   CLINICAL DATA:  Altered mental status and multiple falls.   EXAM: MRI HEAD WITHOUT CONTRAST   TECHNIQUE: Multiplanar, multiecho pulse sequences of the brain and surrounding structures were obtained without intravenous contrast.   COMPARISON:  None.   FINDINGS: Brain: There is no acute infarction or intracranial hemorrhage. There is no intracranial mass, mass effect, or edema. There is no hydrocephalus or extra-axial fluid collection. Prominence of the ventricles and sulci reflects minor parenchymal volume loss. Patchy and confluent areas of T2 hyperintensity in the supratentorial and pontine white matter are nonspecific but probably reflect moderate chronic microvascular ischemic changes.   Vascular: Major vessel flow voids at the skull base are preserved.   Skull and upper cervical spine: Normal marrow signal is preserved.   Sinuses/Orbits: Paranasal sinuses are aerated. Orbits are unremarkable.   Other: Sella is partially empty.  Mastoid air cells are clear.   IMPRESSION: No evidence of recent infarction, hemorrhage, or mass. Moderate chronic microvascular ischemic changes.     Electronically Signed   By: Guadlupe Spanish M.D.   On: 09/27/2021 15:21     POSTURE: flexed trunk     LOWER EXTREMITY MMT:  MMT Right eval Left eval  Hip flexion 5/5 3-/5  Hip extension 2/5 3-/5  Hip abduction 4/5 5/5  Hip adduction    Hip internal rotation    Hip external rotation    Knee flexion 5/5 3+/5  Knee extension 5/5 5/5  Ankle dorsiflexion 4/5 4/5  Ankle plantarflexion  Ankle inversion    Ankle eversion     (Blank rows = not tested)  FUNCTIONAL TESTS:  30 seconds chair stand test:  4 (7 is poor for age and sex; 67 is average) 2 minute walk test: 239 feet; slightly veers to the Rt, no ankle motion forward flexed postionl   Single leg stance: RT: 4"  : LT:  25"    TODAY'S TREATMENT:                                                                                                                               DATE:  02/06/23 NuStep, seat 9, arm 6, level 3, 70-80 SPM, 5 minutes Seated hamstring stretch x 30" x 3 Gastrocnemius slant board stretch x 30" x 3 Sit-to-stand, standard chair x 10 Standing TKE, RTB x 3" x 10 x 2 Forward step ups, 4" box x 10 on each Mini squats x 3" x 10 x 2 Retro walking, CGA, 20 ft x 2 rounds Side stepping, CGA, 20 ft x 2 rounds Stepping over hurdles 4" x 2 rounds  01/31/23 Nustep hills level 3 x 5 minutes. Wall arch 10 Squat x 10  Side step x 2 RT  Sit: Sit to stand x 10  LAQ 3# x 10 B  Scapular retraction with B shoulder ER x 10  Supine: SLR B x 10 each Bridge x 10 rest then 5 more  Side lying: Hip abduction x 10 B  Clam x 10 B  01/17/23 Eval    Seated Scapular Retraction  10 reps - 3-5" hold - Seated Transversus Abdominis Bracing  10 reps - 3-5" hold - Supine Bridge x 5  PATIENT EDUCATION:  Education details: HEP Person educated: Patient Education method: Explanation and Handouts Education comprehension: verbalized understanding  HOME EXERCISE PROGRAM:             3/28:            - Heel Raises with Counter Support  - 2 x daily - 7 x weekly - 1 sets - 10 reps - Mini Squat with Counter Support  - 2 x daily - 7 x weekly - 1 sets - 10 reps - Sit to Stand Without Arm Support  - 2 x daily - 7 x weekly - 1 sets - 10 reps - Active Straight Leg Raise with Quad Set  - 2 x daily - 7 x weekly - 1 sets - 10 reps Access Code: [facility] URL: [email or web address] Date: 01/17/2023 Prepared by: Virgina Organ  Exercises - Seated Scapular Retraction  - 3 x daily - 7 x weekly - 1 sets - 10 reps - 3-5" hold - Seated Transversus Abdominis Bracing  - 3 x daily - 7 x weekly - 1 sets - 10 reps - 3-5" hold - Supine Bridge  - 2 x daily - 7 x weekly - 1 sets - 10 reps - 3-5" hold  ASSESSMENT:  CLINICAL IMPRESSION: Interventions  today were geared towards LE strengthening, and flexibility. Slight to mild  difficulty noted on step ups due to weakness. Mild unsteadiness noted on walking backwards, stepping over hurdles, and side stepping due to impaired proprioception and weakness. Demonstrated mild levels of fatigue. Rest periods provided. Provided mild amount of cueing to ensure correct execution of activity with good carry-over. To date, skilled PT is required to address the impairments and improve function.   OBJECTIVE IMPAIRMENTS: Abnormal gait, decreased activity tolerance, decreased balance, difficulty walking, decreased strength, dizziness, and postural dysfunction.   ACTIVITY LIMITATIONS: carrying, lifting, squatting, and locomotion level  PARTICIPATION LIMITATIONS: meal prep, cleaning, shopping, and community activity  PERSONAL FACTORS: Behavior pattern, Fitness, and Time since onset of injury/illness/exacerbation are also affecting patient's functional outcome.   REHAB POTENTIAL: Fair    CLINICAL DECISION MAKING: Stable/uncomplicated  EVALUATION COMPLEXITY: Low   GOALS: Goals reviewed with patient? No  SHORT TERM GOALS: Target date: 02/06/23 PT to be I in HEP to improve functional activity. Baseline: Goal status: IN PROGRESS  2.  PT mm strength to be increased 1/2 grade to allow pt to be able to come from sit to stand with increased ease from a chair  Baseline:  Goal status: IN PROGRESS  3. PT to be able to stand on RT LE for 10 " for decreased fall risk  Goal status: IN PROGRESS  4.  PT to be able to walk 300 ft in 2 minute period to demonstrate decreased fall risk.  Baseline:  Goal status: IN PROGRESS    LONG TERM GOALS: Target date: 02/28/23  PT to be I  in advanced HEP to improve functional activity. Baseline:  Goal status: IN PROGRESS  2.   PT mm strength to be increased 1 grade to allow pt to be able to come from sit to stand with increased ease from a chair Baseline:  Goal status: IN PROGRESS  3.  PT to be able to ambulate 350 ft in 2 minutes for decreased  fall risk  Baseline:  Goal status: IN PROGRESS  4.  PT to be able to prep and cook her meal without having to take a break  Baseline:  Goal status: IN PROGRESS  5.  PT to be able to ambulate without forward flexed position  Baseline:  Goal status: IN PROGRESS  PLAN:  PT FREQUENCY: 2x/week  PT DURATION: 6 weeks  PLANNED INTERVENTIONS: Therapeutic exercises, Therapeutic activity, Balance training, Gait training, Self Care, and Manual therapy  PLAN FOR NEXT SESSION: continue with postural, core and LE strengthening. Begin balance activity   Iantha Fallen L. Sister Carbone, PT, DPT, OCS Board-Certified Clinical Specialist in Orthopedic PT PT Compact Privilege # ([place]): [id number]   02/06/2023, 1430

## 2023-02-08 ENCOUNTER — Ambulatory Visit (HOSPITAL_COMMUNITY): Payer: Medicare HMO

## 2023-02-08 DIAGNOSIS — M6281 Muscle weakness (generalized): Secondary | ICD-10-CM | POA: Diagnosis not present

## 2023-02-08 DIAGNOSIS — R269 Unspecified abnormalities of gait and mobility: Secondary | ICD-10-CM | POA: Diagnosis not present

## 2023-02-08 NOTE — Therapy (Signed)
OUTPATIENT PHYSICAL THERAPY LOWER EXTREMITY Treatment   Patient Name: Tamara Santiago MRN: [id number] DOB:03/10/1951, 72 y.o., female Today's Date: 02/08/2023  END OF SESSION:   PT End of Session - 02/08/23 1101     Visit Number 4    Number of Visits 12    Date for PT Re-Evaluation 02/28/23    Authorization Type [employer]    Progress Note Due on Visit 10    PT Start Time 1105    PT Stop Time 1145    PT Time Calculation (min) 40 min    Equipment Utilized During Treatment Gait belt    Activity Tolerance Patient tolerated treatment well    Behavior During Therapy WFL for tasks assessed/performed             Past Medical History:  Diagnosis Date   Anxiety    Essential hypertension    GERD (gastroesophageal reflux disease)    Hyperlipidemia    Past Surgical History:  Procedure Laterality Date   CHOLECYSTECTOMY N/A 09/24/2018   Procedure: LAPAROSCOPIC CHOLECYSTECTOMY;  Surgeon: Franky MachoJenkins, Mark, MD;  Location: [facility];  Service: General;  Laterality: N/A;   TUBAL LIGATION     Patient Active Problem List   Diagnosis Date Noted   Acute cholecystitis 09/23/2018   Hypokalemia 09/23/2018   Cholangitis 09/23/2018   Sepsis 09/23/2018    PCP: Nita SellsJohn Hall  REFERRING PROVIDER: Suanne MarkerPenumalli, Vikram R, MD  REFERRING DIAG: R26.9 (ICD-10-CM) - Gait difficulty  THERAPY DIAG:  Gait difficulty  Muscle weakness (generalized)  Rationale for Evaluation and Treatment: Rehabilitation  ONSET DATE: almost 2 years    SUBJECTIVE STATEMENT: Doing well today. Denies recent falls. Also denies any pain  PERTINENT HISTORY: Anxiety, HTN PAIN:  Are you having pain? No  PRECAUTIONS: Fall  WEIGHT BEARING RESTRICTIONS: No  FALLS:  Has patient fallen in last 6 months? Yes. Number of falls 3  PATIENT GOALS: To be able to walk longer, no falls and have more energy  NEXT MD VISIT: as needed   OBJECTIVE:   DIAGNOSTIC FINDINGS:  Narrative & Impression  CLINICAL DATA:  Altered mental status and  multiple falls.   EXAM: MRI HEAD WITHOUT CONTRAST   TECHNIQUE: Multiplanar, multiecho pulse sequences of the brain and surrounding structures were obtained without intravenous contrast.   COMPARISON:  None.   FINDINGS: Brain: There is no acute infarction or intracranial hemorrhage. There is no intracranial mass, mass effect, or edema. There is no hydrocephalus or extra-axial fluid collection. Prominence of the ventricles and sulci reflects minor parenchymal volume loss. Patchy and confluent areas of T2 hyperintensity in the supratentorial and pontine white matter are nonspecific but probably reflect moderate chronic microvascular ischemic changes.   Vascular: Major vessel flow voids at the skull base are preserved.   Skull and upper cervical spine: Normal marrow signal is preserved.   Sinuses/Orbits: Paranasal sinuses are aerated. Orbits are unremarkable.   Other: Sella is partially empty.  Mastoid air cells are clear.   IMPRESSION: No evidence of recent infarction, hemorrhage, or mass. Moderate chronic microvascular ischemic changes.     Electronically Signed   By: Guadlupe SpanishPraneil  Patel M.D.   On: 09/27/2021 15:21     POSTURE: flexed trunk     LOWER EXTREMITY MMT:  MMT Right eval Left eval  Hip flexion 5/5 3-/5  Hip extension 2/5 3-/5  Hip abduction 4/5 5/5  Hip adduction    Hip internal rotation    Hip external rotation    Knee flexion 5/5 3+/5  Knee  extension 5/5 5/5  Ankle dorsiflexion 4/5 4/5  Ankle plantarflexion    Ankle inversion    Ankle eversion     (Blank rows = not tested)  FUNCTIONAL TESTS:  30 seconds chair stand test:  4 (7 is poor for age and sex; 5 is average) 2 minute walk test: 239 feet; slightly veers to the Rt, no ankle motion forward flexed postionl   Single leg stance: RT: 4"  : LT:  25"    TODAY'S TREATMENT:                                                                                                                               DATE:  02/08/23 NuStep, seat 9, arm 6, level 3, 80-90 SPM, 5 minutes Seated hamstring stretch x 30" x 3 Gastrocnemius slant board stretch x 30" x 3 Sit-to-stand, standard chair x 10 Standing TKE, GTB x 3" x 10 x 2 Forward step ups, 4" box x 10 x 2 on each Mini squats x 3" x 10 x 2 Hip vectors, RTB x 10 x 2 Retro walking, CGA, 20 ft x 2 rounds Side stepping, CGA, 20 ft x 2 rounds Stepping over hurdles 4" x 2 rounds  02/06/23 NuStep, seat 9, arm 6, level 3, 70-80 SPM, 5 minutes Seated hamstring stretch x 30" x 3 Gastrocnemius slant board stretch x 30" x 3 Sit-to-stand, standard chair x 10 Standing TKE, RTB x 3" x 10 x 2 Forward step ups, 4" box x 10 on each Mini squats x 3" x 10 x 2 Retro walking, CGA, 20 ft x 2 rounds Side stepping, CGA, 20 ft x 2 rounds Stepping over hurdles 4" x 2 rounds  01/31/23 Nustep hills level 3 x 5 minutes. Wall arch 10 Squat x 10  Side step x 2 RT  Sit: Sit to stand x 10  LAQ 3# x 10 B  Scapular retraction with B shoulder ER x 10  Supine: SLR B x 10 each Bridge x 10 rest then 5 more  Side lying: Hip abduction x 10 B  Clam x 10 B  01/17/23 Eval    Seated Scapular Retraction  10 reps - 3-5" hold - Seated Transversus Abdominis Bracing  10 reps - 3-5" hold - Supine Bridge x 5  PATIENT EDUCATION:  Education details: HEP Person educated: Patient Education method: [employer] Technology [employer] Education comprehension: verbalized understanding  HOME EXERCISE PROGRAM: Access Code: [email or web address] URL: [email or web address] 02/08/2023             3/28:            - Heel Raises with Counter Support  - 2 x daily - 7 x weekly - 1 sets - 10 reps - Mini Squat with Counter Support  - 2 x daily - 7 x weekly - 1 sets - 10 reps - Sit to Stand Without Arm Support  - 2 x daily - 7 x weekly - 1 sets -  10 reps - Active Straight Leg Raise with Quad Set  - 2 x daily - 7 x weekly - 1 sets - 10 reps  Date: 01/17/2023 Prepared by: Virgina Organynthia  Russell  Exercises - Seated Scapular Retraction  - 3 x daily - 7 x weekly - 1 sets - 10 reps - 3-5" hold - Seated Transversus Abdominis Bracing  - 3 x daily - 7 x weekly - 1 sets - 10 reps - 3-5" hold - Supine Bridge  - 2 x daily - 7 x weekly - 1 sets - 10 reps - 3-5" hold  ASSESSMENT:  CLINICAL IMPRESSION: Interventions today were geared towards LE strengthening, and flexibility. Patient still presents with slight to mild difficulty noted on step ups due to weakness. Patients also present with unsteadiness on walking backwards, stepping over hurdles, and side stepping due to impaired proprioception and weakness. Still demonstrated mild levels of fatigue. Rest periods provided. Provided mild amount of cueing to ensure correct execution of activity with good carry-over. To date, skilled PT is required to address the impairments and improve function.   OBJECTIVE IMPAIRMENTS: Abnormal gait, decreased activity tolerance, decreased balance, difficulty walking, decreased strength, dizziness, and postural dysfunction.   ACTIVITY LIMITATIONS: carrying, lifting, squatting, and locomotion level  PARTICIPATION LIMITATIONS: meal prep, cleaning, shopping, and community activity  PERSONAL FACTORS: Behavior pattern, Fitness, and Time since onset of injury/illness/exacerbation are also affecting patient's functional outcome.   REHAB POTENTIAL: Fair    CLINICAL DECISION MAKING: Stable/uncomplicated  EVALUATION COMPLEXITY: Low   GOALS: Goals reviewed with patient? No  SHORT TERM GOALS: Target date: 02/06/23 PT to be I in HEP to improve functional activity. Baseline: Goal status: IN PROGRESS  2.  PT mm strength to be increased 1/2 grade to allow pt to be able to come from sit to stand with increased ease from a chair  Baseline:  Goal status: IN PROGRESS  3. PT to be able to stand on RT LE for 10 " for decreased fall risk  Goal status: IN PROGRESS  4.  PT to be able to walk 300 ft in 2 minute  period to demonstrate decreased fall risk.  Baseline:  Goal status: IN PROGRESS    LONG TERM GOALS: Target date: 02/28/23  PT to be I  in advanced HEP to improve functional activity. Baseline:  Goal status: IN PROGRESS  2.   PT mm strength to be increased 1 grade to allow pt to be able to come from sit to stand with increased ease from a chair Baseline:  Goal status: IN PROGRESS  3.  PT to be able to ambulate 350 ft in 2 minutes for decreased fall risk  Baseline:  Goal status: IN PROGRESS  4.  PT to be able to prep and cook her meal without having to take a break  Baseline:  Goal status: IN PROGRESS  5.  PT to be able to ambulate without forward flexed position  Baseline:  Goal status: IN PROGRESS  PLAN:  PT FREQUENCY: 2x/week  PT DURATION: 6 weeks  PLANNED INTERVENTIONS: Therapeutic exercises, Therapeutic activity, Balance training, Gait training, Self Care, and Manual therapy  PLAN FOR NEXT SESSION: continue with postural, core and LE strengthening. Begin balance activity   Iantha FallenKenneth L. Breeana Sawtelle, PT, DPT, OCS Board-Certified Clinical Specialist in Orthopedic PT PT Compact Privilege # ([place]): [id number] T  02/08/2023 1145

## 2023-02-12 ENCOUNTER — Ambulatory Visit (HOSPITAL_COMMUNITY): Payer: Medicare HMO | Admitting: Physical Therapy

## 2023-02-12 DIAGNOSIS — R269 Unspecified abnormalities of gait and mobility: Secondary | ICD-10-CM

## 2023-02-12 DIAGNOSIS — M6281 Muscle weakness (generalized): Secondary | ICD-10-CM | POA: Diagnosis not present

## 2023-02-12 NOTE — Therapy (Signed)
OUTPATIENT PHYSICAL THERAPY LOWER EXTREMITY Treatment   Patient Name: Adelfa Sobalvarro MRN: [id number] DOB:06-21-1951, 72 y.o., female Today's Date: 02/12/2023  END OF SESSION:   PT End of Session - 02/12/23 1202     Visit Number 5    Number of Visits 12    Date for PT Re-Evaluation 02/28/23    Authorization Type [employer]    Progress Note Due on Visit 10    PT Start Time 1115    PT Stop Time 1202    PT Time Calculation (min) 47 min    Equipment Utilized During Treatment Gait belt    Activity Tolerance Patient tolerated treatment well    Behavior During Therapy WFL for tasks assessed/performed              Past Medical History:  Diagnosis Date   Anxiety    Essential hypertension    GERD (gastroesophageal reflux disease)    Hyperlipidemia    Past Surgical History:  Procedure Laterality Date   CHOLECYSTECTOMY N/A 09/24/2018   Procedure: LAPAROSCOPIC CHOLECYSTECTOMY;  Surgeon: Franky Macho, MD;  Location: [facility];  Service: General;  Laterality: N/A;   TUBAL LIGATION     Patient Active Problem List   Diagnosis Date Noted   Acute cholecystitis 09/23/2018   Hypokalemia 09/23/2018   Cholangitis 09/23/2018   Sepsis 09/23/2018    PCP: Nita Sells  REFERRING PROVIDER: Suanne Marker, MD  REFERRING DIAG: R26.9 (ICD-10-CM) - Gait difficulty  THERAPY DIAG:  Gait difficulty  Muscle weakness (generalized)  Rationale for Evaluation and Treatment: Rehabilitation  ONSET DATE: almost 2 years    SUBJECTIVE STATEMENT: Pt has no complaints  PERTINENT HISTORY: Anxiety, HTN PAIN:  Are you having pain? No  PRECAUTIONS: Fall  WEIGHT BEARING RESTRICTIONS: No  FALLS:  Has patient fallen in last 6 months? Yes. Number of falls 3  PATIENT GOALS: To be able to walk longer, no falls and have more energy  NEXT MD VISIT: as needed   OBJECTIVE:   DIAGNOSTIC FINDINGS:  Narrative & Impression  CLINICAL DATA:  Altered mental status and multiple falls.   EXAM: MRI HEAD  WITHOUT CONTRAST   TECHNIQUE: Multiplanar, multiecho pulse sequences of the brain and surrounding structures were obtained without intravenous contrast.   COMPARISON:  None.   FINDINGS: Brain: There is no acute infarction or intracranial hemorrhage. There is no intracranial mass, mass effect, or edema. There is no hydrocephalus or extra-axial fluid collection. Prominence of the ventricles and sulci reflects minor parenchymal volume loss. Patchy and confluent areas of T2 hyperintensity in the supratentorial and pontine white matter are nonspecific but probably reflect moderate chronic microvascular ischemic changes.   Vascular: Major vessel flow voids at the skull base are preserved.   Skull and upper cervical spine: Normal marrow signal is preserved.   Sinuses/Orbits: Paranasal sinuses are aerated. Orbits are unremarkable.   Other: Sella is partially empty.  Mastoid air cells are clear.   IMPRESSION: No evidence of recent infarction, hemorrhage, or mass. Moderate chronic microvascular ischemic changes.     Electronically Signed   By: Guadlupe Spanish M.D.   On: 09/27/2021 15:21     POSTURE: flexed trunk     LOWER EXTREMITY MMT:  MMT Right eval Left eval  Hip flexion 5/5 3-/5  Hip extension 2/5 3-/5  Hip abduction 4/5 5/5  Hip adduction    Hip internal rotation    Hip external rotation    Knee flexion 5/5 3+/5  Knee extension 5/5 5/5  Ankle  dorsiflexion 4/5 4/5  Ankle plantarflexion    Ankle inversion    Ankle eversion     (Blank rows = not tested)  FUNCTIONAL TESTS:  30 seconds chair stand test:  4 (7 is poor for age and sex; 93 is average) 2 minute walk test: 239 feet; slightly veers to the Rt, no ankle motion forward flexed postionl   Single leg stance: RT: 4"  : LT:  25"    TODAY'S TREATMENT:                                                                                                                              DATE:  02/12/23 Rocker board x 2  minutes Heel raise x 10 Toe raise on slant at // x 10  Sit to stand x 10  Step up 6" step x 10  B  Tandem stance x 5 each  Functional squat x 10  Side step with green theraband x 2 with rest in between. Retro gt with green theraband x 2 Marching x 10  Nustep x 6' level 3 hills 3 02/08/23 NuStep, seat 9, arm 6, level 3, 80-90 SPM, 5 minutes Seated hamstring stretch x 30" x 3 Gastrocnemius slant board stretch x 30" x 3 Sit-to-stand, standard chair x 10 Standing TKE, GTB x 3" x 10 x 2 Forward step ups, 4" box x 10 x 2 on each Mini squats x 3" x 10 x 2 Hip vectors, RTB x 10 x 2 Retro walking, CGA, 20 ft x 2 rounds Side stepping, CGA, 20 ft x 2 rounds Stepping over hurdles 4" x 2 rounds  02/06/23 NuStep, seat 9, arm 6, level 3, 70-80 SPM, 5 minutes Seated hamstring stretch x 30" x 3 Gastrocnemius slant board stretch x 30" x 3 Sit-to-stand, standard chair x 10 Standing TKE, RTB x 3" x 10 x 2 Forward step ups, 4" box x 10 on each Mini squats x 3" x 10 x 2 Retro walking, CGA, 20 ft x 2 rounds Side stepping, CGA, 20 ft x 2 rounds Stepping over hurdles 4" x 2 rounds  01/31/23 Nustep hills level 3 x 5 minutes. Wall arch 10 Squat x 10  Side step x 2 RT  Sit: Sit to stand x 10  LAQ 3# x 10 B  Scapular retraction with B shoulder ER x 10  Supine: SLR B x 10 each Bridge x 10 rest then 5 more  Side lying: Hip abduction x 10 B  Clam x 10 B  01/17/23 Eval    Seated Scapular Retraction  10 reps - 3-5" hold - Seated Transversus Abdominis Bracing  10 reps - 3-5" hold - Supine Bridge x 5  PATIENT EDUCATION:  Education details: HEP Person educated: Patient Education method: Chief Technology Officer Education comprehension: verbalized understanding  HOME EXERCISE PROGRAM: Access Code: [email or web address] URL: [email or web address] 02/08/2023             3/28:            -  Heel Raises with Counter Support  - 2 x daily - 7 x weekly - 1 sets - 10 reps - Mini Squat with Counter  Support  - 2 x daily - 7 x weekly - 1 sets - 10 reps - Sit to Stand Without Arm Support  - 2 x daily - 7 x weekly - 1 sets - 10 reps - Active Straight Leg Raise with Quad Set  - 2 x daily - 7 x weekly - 1 sets - 10 reps  Date: 01/17/2023 Prepared by: Virgina Organynthia Loreli Debruler  Exercises - Seated Scapular Retraction  - 3 x daily - 7 x weekly - 1 sets - 10 reps - 3-5" hold - Seated Transversus Abdominis Bracing  - 3 x daily - 7 x weekly - 1 sets - 10 reps - 3-5" hold - Supine Bridge  - 2 x daily - 7 x weekly - 1 sets - 10 reps - 3-5" hold  ASSESSMENT:  CLINICAL IMPRESSION: Today's treatment focused on strengthening and balance. Pt needed multiple rest breaks with treatment.   Provided mild amount of cueing to ensure correct execution of activity with good carry-over. To date, skilled PT is required to address the impairments and improve function.   OBJECTIVE IMPAIRMENTS: Abnormal gait, decreased activity tolerance, decreased balance, difficulty walking, decreased strength, dizziness, and postural dysfunction.   ACTIVITY LIMITATIONS: carrying, lifting, squatting, and locomotion level  PARTICIPATION LIMITATIONS: meal prep, cleaning, shopping, and community activity  PERSONAL FACTORS: Behavior pattern, Fitness, and Time since onset of injury/illness/exacerbation are also affecting patient's functional outcome.   REHAB POTENTIAL: Fair    CLINICAL DECISION MAKING: Stable/uncomplicated  EVALUATION COMPLEXITY: Low   GOALS: Goals reviewed with patient? No  SHORT TERM GOALS: Target date: 02/06/23 PT to be I in HEP to improve functional activity. Baseline: Goal status: IN PROGRESS  2.  PT mm strength to be increased 1/2 grade to allow pt to be able to come from sit to stand with increased ease from a chair  Baseline:  Goal status: IN PROGRESS  3. PT to be able to stand on RT LE for 10 " for decreased fall risk  Goal status: IN PROGRESS  4.  PT to be able to walk 300 ft in 2 minute period to  demonstrate decreased fall risk.  Baseline:  Goal status: IN PROGRESS    LONG TERM GOALS: Target date: 02/28/23  PT to be I  in advanced HEP to improve functional activity. Baseline:  Goal status: IN PROGRESS  2.   PT mm strength to be increased 1 grade to allow pt to be able to come from sit to stand with increased ease from a chair Baseline:  Goal status: IN PROGRESS  3.  PT to be able to ambulate 350 ft in 2 minutes for decreased fall risk  Baseline:  Goal status: IN PROGRESS  4.  PT to be able to prep and cook her meal without having to take a break  Baseline:  Goal status: IN PROGRESS  5.  PT to be able to ambulate without forward flexed position  Baseline:  Goal status: IN PROGRESS  PLAN:  PT FREQUENCY: 2x/week  PT DURATION: 6 weeks  PLANNED INTERVENTIONS: Therapeutic exercises, Therapeutic activity, Balance training, Gait training, Self Care, and Manual therapy  PLAN FOR NEXT SESSION: continue with postural, core and LE strengthening and balance activity   Virgina OrganCynthia Ladaisha Portillo, PT CLT [phone] 02/12/2023 1200

## 2023-02-14 ENCOUNTER — Ambulatory Visit (HOSPITAL_COMMUNITY): Payer: Medicare HMO | Admitting: Physical Therapy

## 2023-02-14 DIAGNOSIS — M6281 Muscle weakness (generalized): Secondary | ICD-10-CM | POA: Diagnosis not present

## 2023-02-14 DIAGNOSIS — R269 Unspecified abnormalities of gait and mobility: Secondary | ICD-10-CM | POA: Diagnosis not present

## 2023-02-14 DIAGNOSIS — E538 Deficiency of other specified B group vitamins: Secondary | ICD-10-CM | POA: Diagnosis not present

## 2023-02-14 NOTE — Therapy (Signed)
OUTPATIENT PHYSICAL THERAPY LOWER EXTREMITY Treatment   Patient Name: Tamara Santiago MRN: [id number] DOB:12/11/1950, 72 y.o., female Today's Date: 02/14/2023  END OF SESSION:   PT End of Session - 02/14/23 1430    Visit Number 6    Number of Visits 12    Date for PT Re-Evaluation 02/28/23    Authorization Type [employer]    Progress Note Due on Visit 10    PT Start Time 1350    PT Stop Time 1430    PT Time Calculation (min) 40 min    Equipment Utilized During Treatment Gait belt    Activity Tolerance Patient tolerated treatment well    Behavior During Therapy WFL for tasks assessed/performed          Past Medical History:  Diagnosis Date   Anxiety    Essential hypertension    GERD (gastroesophageal reflux disease)    Hyperlipidemia    Past Surgical History:  Procedure Laterality Date   CHOLECYSTECTOMY N/A 09/24/2018   Procedure: LAPAROSCOPIC CHOLECYSTECTOMY;  Surgeon: Franky Macho, MD;  Location: [facility];  Service: General;  Laterality: N/A;   TUBAL LIGATION     Patient Active Problem List   Diagnosis Date Noted   Acute cholecystitis 09/23/2018   Hypokalemia 09/23/2018   Cholangitis 09/23/2018   Sepsis 09/23/2018    PCP: Nita Sells  REFERRING PROVIDER: Suanne Marker, MD  REFERRING DIAG: R26.9 (ICD-10-CM) - Gait difficulty  THERAPY DIAG:  Gait difficulty  Muscle weakness (generalized)  Rationale for Evaluation and Treatment: Rehabilitation  ONSET DATE: almost 2 years    SUBJECTIVE STATEMENT: Pt has no complaints , states she has noted that it is easier for her to get up.  PERTINENT HISTORY: Anxiety, HTN PAIN:  Are you having pain? No  PRECAUTIONS: Fall  WEIGHT BEARING RESTRICTIONS: No  FALLS:  Has patient fallen in last 6 months? Yes. Number of falls 3  PATIENT GOALS: To be able to walk longer, no falls and have more energy  NEXT MD VISIT: as needed   OBJECTIVE:   DIAGNOSTIC FINDINGS:  Narrative & Impression  CLINICAL DATA:  Altered  mental status and multiple falls.   EXAM: MRI HEAD WITHOUT CONTRAST   TECHNIQUE: Multiplanar, multiecho pulse sequences of the brain and surrounding structures were obtained without intravenous contrast.   COMPARISON:  None.   FINDINGS: Brain: There is no acute infarction or intracranial hemorrhage. There is no intracranial mass, mass effect, or edema. There is no hydrocephalus or extra-axial fluid collection. Prominence of the ventricles and sulci reflects minor parenchymal volume loss. Patchy and confluent areas of T2 hyperintensity in the supratentorial and pontine white matter are nonspecific but probably reflect moderate chronic microvascular ischemic changes.   Vascular: Major vessel flow voids at the skull base are preserved.   Skull and upper cervical spine: Normal marrow signal is preserved.   Sinuses/Orbits: Paranasal sinuses are aerated. Orbits are unremarkable.   Other: Sella is partially empty.  Mastoid air cells are clear.   IMPRESSION: No evidence of recent infarction, hemorrhage, or mass. Moderate chronic microvascular ischemic changes.     Electronically Signed   By: Guadlupe Spanish M.D.   On: 09/27/2021 15:21     POSTURE: flexed trunk     LOWER EXTREMITY MMT:  MMT Right eval Left eval  Hip flexion 5/5 3-/5  Hip extension 2/5 3-/5  Hip abduction 4/5 5/5  Hip adduction    Hip internal rotation    Hip external rotation    Knee flexion  5/5 3+/5  Knee extension 5/5 5/5  Ankle dorsiflexion 4/5 4/5  Ankle plantarflexion    Ankle inversion    Ankle eversion     (Blank rows = not tested)  FUNCTIONAL TESTS: 01/17/23 30 seconds chair stand test:  4 (7 is poor for age and sex; 48 is average) 2 minute walk test: 239 feet; slightly veers to the Rt, no ankle motion forward flexed postionl   Single leg stance: RT: 4"  : LT:  25"    TODAY'S TREATMENT:                                                                                                                               DATE:  02/14/23: Side step at back hall with  theraband x 2 RT Hip extension with theraband x 15 B Toe raises x 15 Wall arch x 15  Semi-Tandem stance with head turns x 5  Sit to stand x 15 Marching x 10  Functional squat x 15 Quadriped: leg raise x 5 each  Nustep hills 3 level 3 x 6'  02/12/23 Rocker board x 2 minutes Heel raise x 10 Toe raise on slant at // x 10  Sit to stand x 10  Step up 6" step x 10  B  Tandem stance x 5 each  Functional squat x 10  Side step with green theraband x 2 with rest in between. Retro gt with green theraband x 2 Marching x 10  Nustep x 6' level 3 hills 3 02/08/23 NuStep, seat 9, arm 6, level 3, 80-90 SPM, 5 minutes Seated hamstring stretch x 30" x 3 Gastrocnemius slant board stretch x 30" x 3 Sit-to-stand, standard chair x 10 Standing TKE, GTB x 3" x 10 x 2 Forward step ups, 4" box x 10 x 2 on each Mini squats x 3" x 10 x 2 Hip vectors, RTB x 10 x 2 Retro walking, CGA, 20 ft x 2 rounds Side stepping, CGA, 20 ft x 2 rounds Stepping over hurdles 4" x 2 rounds  02/06/23 NuStep, seat 9, arm 6, level 3, 70-80 SPM, 5 minutes Seated hamstring stretch x 30" x 3 Gastrocnemius slant board stretch x 30" x 3 Sit-to-stand, standard chair x 10 Standing TKE, RTB x 3" x 10 x 2 Forward step ups, 4" box x 10 on each Mini squats x 3" x 10 x 2 Retro walking, CGA, 20 ft x 2 rounds Side stepping, CGA, 20 ft x 2 rounds Stepping over hurdles 4" x 2 rounds  01/31/23 Nustep hills level 3 x 5 minutes. Wall arch 10 Squat x 10  Side step x 2 RT  Sit: Sit to stand x 10  LAQ 3# x 10 B  Scapular retraction with B shoulder ER x 10  Supine: SLR B x 10 each Bridge x 10 rest then 5 more  Side lying: Hip abduction x 10 B  Clam x 10 B  01/17/23 Eval  Seated Scapular Retraction  10 reps - 3-5" hold - Seated Transversus Abdominis Bracing  10 reps - 3-5" hold - Supine Bridge x 5  PATIENT EDUCATION:  Education details: HEP Person  educated: Patient Education method: [employer] Education comprehension: verbalized understanding  HOME EXERCISE PROGRAM: Access Code: [email or web address] URL: [email or web address] 02/08/2023             3/28:            - Heel Raises with Counter Support  - 2 x daily - 7 x weekly - 1 sets - 10 reps - Mini Squat with Counter Support  - 2 x daily - 7 x weekly - 1 sets - 10 reps - Sit to Stand Without Arm Support  - 2 x daily - 7 x weekly - 1 sets - 10 reps - Active Straight Leg Raise with Quad Set  - 2 x daily - 7 x weekly - 1 sets - 10 reps  Date: 01/17/2023 Prepared by: Virgina Organ  Exercises - Seated Scapular Retraction  - 3 x daily - 7 x weekly - 1 sets - 10 reps - 3-5" hold - Seated Transversus Abdominis Bracing  - 3 x daily - 7 x weekly - 1 sets - 10 reps - 3-5" hold - Supine Bridge  - 2 x daily - 7 x weekly - 1 sets - 10 reps - 3-5" hold  ASSESSMENT:  CLINICAL IMPRESSION: Today's treatment focused on strengthening and balance. Pt continues to need multiple rest breaks with treatment. Noted significant improvement in pt ability to come sit to stand.  Therapist provided mild amount of cueing to ensure correct execution of activity. To date, skilled PT is required to address the impairments and improve function.   OBJECTIVE IMPAIRMENTS: Abnormal gait, decreased activity tolerance, decreased balance, difficulty walking, decreased strength, dizziness, and postural dysfunction.   ACTIVITY LIMITATIONS: carrying, lifting, squatting, and locomotion level  PARTICIPATION LIMITATIONS: meal prep, cleaning, shopping, and community activity  PERSONAL FACTORS: Behavior pattern, Fitness, and Time since onset of injury/illness/exacerbation are also affecting patient's functional outcome.   REHAB POTENTIAL: Fair    CLINICAL DECISION MAKING: Stable/uncomplicated  EVALUATION COMPLEXITY: Low   GOALS: Goals reviewed with patient? No  SHORT TERM GOALS: Target date:  02/06/23 PT to be I in HEP to improve functional activity. Baseline: Goal status: IN PROGRESS  2.  PT mm strength to be increased 1/2 grade to allow pt to be able to come from sit to stand with increased ease from a chair  Baseline:  Goal status: IN PROGRESS  3. PT to be able to stand on RT LE for 10 " for decreased fall risk  Goal status: IN PROGRESS  4.  PT to be able to walk 300 ft in 2 minute period to demonstrate decreased fall risk.  Baseline:  Goal status: IN PROGRESS    LONG TERM GOALS: Target date: 02/28/23  PT to be I  in advanced HEP to improve functional activity. Baseline:  Goal status: IN PROGRESS  2.   PT mm strength to be increased 1 grade to allow pt to be able to come from sit to stand with increased ease from a chair Baseline:  Goal status: IN PROGRESS  3.  PT to be able to ambulate 350 ft in 2 minutes for decreased fall risk  Baseline:  Goal status: IN PROGRESS  4.  PT to be able to prep and cook her meal without having to take a break  Baseline:  Goal status: IN PROGRESS  5.  PT to be able to ambulate without forward flexed position  Baseline:  Goal status: IN PROGRESS  PLAN:  PT FREQUENCY: 2x/week  PT DURATION: 6 weeks  PLANNED INTERVENTIONS: Therapeutic exercises, Therapeutic activity, Balance training, Gait training, Self Care, and Manual therapy  PLAN FOR NEXT SESSION: continue with postural, core and LE strengthening and balance activity   Virgina OrganCynthia Ronette Hank, PT CLT [phone] 02/14/2023 1430

## 2023-02-19 ENCOUNTER — Ambulatory Visit (HOSPITAL_COMMUNITY): Payer: Medicare HMO | Admitting: Physical Therapy

## 2023-02-19 DIAGNOSIS — R269 Unspecified abnormalities of gait and mobility: Secondary | ICD-10-CM | POA: Diagnosis not present

## 2023-02-19 DIAGNOSIS — M6281 Muscle weakness (generalized): Secondary | ICD-10-CM

## 2023-02-19 NOTE — Therapy (Signed)
OUTPATIENT PHYSICAL THERAPY LOWER EXTREMITY Treatment   Patient Name: Tamara Santiago MRN: [id number] DOB:[birth year], 72 y.o., female Today's Date: 02/19/2023  END OF SESSION:   PT End of Session - 02/19/23 1203    Visit Number 7    Number of Visits 12    Date for PT Re-Evaluation 02/28/23    Authorization Type [employer]    Progress Note Due on Visit 10    PT Start Time 1123    PT Stop Time 1203    PT Time Calculation (min) 40 min    Equipment Utilized During Treatment Gait belt    Activity Tolerance Patient tolerated treatment well    Behavior During Therapy WFL for tasks assessed/performed              Past Medical History:  Diagnosis Date   Anxiety    Essential hypertension    GERD (gastroesophageal reflux disease)    Hyperlipidemia    Past Surgical History:  Procedure Laterality Date   CHOLECYSTECTOMY N/A 09/24/2018   Procedure: LAPAROSCOPIC CHOLECYSTECTOMY;  Surgeon: Franky Macho, MD;  Location: [facility];  Service: General;  Laterality: N/A;   TUBAL LIGATION     Patient Active Problem List   Diagnosis Date Noted   Acute cholecystitis 09/23/2018   Hypokalemia 09/23/2018   Cholangitis 09/23/2018   Sepsis 09/23/2018    PCP: Nita Sells  REFERRING PROVIDER: Suanne Marker, MD  REFERRING DIAG: R26.9 (ICD-10-CM) - Gait difficulty  THERAPY DIAG:  Gait difficulty  Muscle weakness (generalized)  Rationale for Evaluation and Treatment: Rehabilitation  ONSET DATE: almost 2 years    SUBJECTIVE STATEMENT: Pt has no complaints. PERTINENT HISTORY: Anxiety, HTN PAIN:  Are you having pain? No  PRECAUTIONS: Fall  WEIGHT BEARING RESTRICTIONS: No  FALLS:  Has patient fallen in last 6 months? Yes. Number of falls 3  PATIENT GOALS: To be able to walk longer, no falls and have more energy  NEXT MD VISIT: as needed   OBJECTIVE:   DIAGNOSTIC FINDINGS:  Narrative & Impression  CLINICAL DATA:  Altered mental status and multiple falls.   EXAM: MRI HEAD  WITHOUT CONTRAST   TECHNIQUE: Multiplanar, multiecho pulse sequences of the brain and surrounding structures were obtained without intravenous contrast.   COMPARISON:  None.   FINDINGS: Brain: There is no acute infarction or intracranial hemorrhage. There is no intracranial mass, mass effect, or edema. There is no hydrocephalus or extra-axial fluid collection. Prominence of the ventricles and sulci reflects minor parenchymal volume loss. Patchy and confluent areas of T2 hyperintensity in the supratentorial and pontine white matter are nonspecific but probably reflect moderate chronic microvascular ischemic changes.   Vascular: Major vessel flow voids at the skull base are preserved.   Skull and upper cervical spine: Normal marrow signal is preserved.   Sinuses/Orbits: Paranasal sinuses are aerated. Orbits are unremarkable.   Other: Sella is partially empty.  Mastoid air cells are clear.   IMPRESSION: No evidence of recent infarction, hemorrhage, or mass. Moderate chronic microvascular ischemic changes.     Electronically Signed   By: Guadlupe Spanish M.D.   On: 09/27/2021 15:21     POSTURE: flexed trunk     LOWER EXTREMITY MMT:  MMT Right eval Left eval  Hip flexion 5/5 3-/5  Hip extension 2/5 3-/5  Hip abduction 4/5 5/5  Hip adduction    Hip internal rotation    Hip external rotation    Knee flexion 5/5 3+/5  Knee extension 5/5 5/5  Ankle dorsiflexion 4/5  4/5  Ankle plantarflexion    Ankle inversion    Ankle eversion     (Blank rows = not tested)  FUNCTIONAL TESTS: 01/17/23 30 seconds chair stand test:  4 (7 is poor for age and sex; 79 is average) 2 minute walk test: 239 feet; slightly veers to the Rt, no ankle motion forward flexed postionl   Single leg stance: RT: 4"  : LT:  25"    TODAY'S TREATMENT:                                                                                                                              DATE:  02/19/23 Wall arch  x 20 Steps reciprocal x 4 reps  Sit to stand x 15 Toe raises x 10  Side step green theraband x 2 RT  Tandem stance with head turns x 5 B  Functional squat  x 10  Marching x 10  Supine: Double knee to chest one minute x 3 for one minute hold  Single knee to chest x 3 for 30" hold  Bridge hold 5" x 10  Quadriped Single arm x 10 Single leg x 5  Prone: Glut set x 10  Heel squeeze x 10  02/14/23: Side step at back hall with  theraband x 2 RT Hip extension with theraband x 15 B Toe raises x 15 Wall arch x 15  Semi-Tandem stance with head turns x 5  Sit to stand x 15 Marching x 10  Functional squat x 15 Quadriped: leg raise x 5 each  Nustep hills 3 level 3 x 6'  02/12/23 Rocker board x 2 minutes Heel raise x 10 Toe raise on slant at // x 10  Sit to stand x 10  Step up 6" step x 10  B  Tandem stance x 5 each  Functional squat x 10  Side step with green theraband x 2 with rest in between. Retro gt with green theraband x 2 Marching x 10  Nustep x 6' level 3 hills 3 02/08/23 NuStep, seat 9, arm 6, level 3, 80-90 SPM, 5 minutes Seated hamstring stretch x 30" x 3 Gastrocnemius slant board stretch x 30" x 3 Sit-to-stand, standard chair x 10 Standing TKE, GTB x 3" x 10 x 2 Forward step ups, 4" box x 10 x 2 on each Mini squats x 3" x 10 x 2 Hip vectors, RTB x 10 x 2 Retro walking, CGA, 20 ft x 2 rounds Side stepping, CGA, 20 ft x 2 rounds Stepping over hurdles 4" x 2 rounds  02/06/23 NuStep, seat 9, arm 6, level 3, 70-80 SPM, 5 minutes Seated hamstring stretch x 30" x 3 Gastrocnemius slant board stretch x 30" x 3 Sit-to-stand, standard chair x 10 Standing TKE, RTB x 3" x 10 x 2 Forward step ups, 4" box x 10 on each Mini squats x 3" x 10 x 2 Retro walking, CGA, 20 ft x 2 rounds  Side stepping, CGA, 20 ft x 2 rounds Stepping over hurdles 4" x 2 rounds  01/31/23 Nustep hills level 3 x 5 minutes. Wall arch 10 Squat x 10  Side step x 2 RT  Sit: Sit to stand x 10  LAQ 3# x  10 B  Scapular retraction with B shoulder ER x 10  Supine: SLR B x 10 each Bridge x 10 rest then 5 more  Side lying: Hip abduction x 10 B  Clam x 10 B  01/17/23 Eval    Seated Scapular Retraction  10 reps - 3-5" hold - Seated Transversus Abdominis Bracing  10 reps - 3-5" hold - Supine Bridge x 5  PATIENT EDUCATION:  Education details: HEP Person educated: Patient Education method: [employer] Technology [employer] Education comprehension: verbalized understanding  HOME EXERCISE PROGRAM: Access Code: [email or web address] URL: [email or web address] 02/08/2023             3/28:            - Heel Raises with Counter Support  - 2 x daily - 7 x weekly - 1 sets - 10 reps - Mini Squat with Counter Support  - 2 x daily - 7 x weekly - 1 sets - 10 reps - Sit to Stand Without Arm Support  - 2 x daily - 7 x weekly - 1 sets - 10 reps - Active Straight Leg Raise with Quad Set  - 2 x daily - 7 x weekly - 1 sets - 10 reps  Date: 01/17/2023 Prepared by: Virgina Organ  Exercises - Seated Scapular Retraction  - 3 x daily - 7 x weekly - 1 sets - 10 reps - 3-5" hold - Seated Transversus Abdominis Bracing  - 3 x daily - 7 x weekly - 1 sets - 10 reps - 3-5" hold - Supine Bridge  - 2 x daily - 7 x weekly - 1 sets - 10 reps - 3-5" hold  ASSESSMENT:  CLINICAL IMPRESSION: Treatment focused on strengthening and balance. Pt continues to need multiple rest breaks with treatment. Pt had more difficulty in pt ability to come sit to stand today.  Returned to flexion based stretches.   Therapist provided mild amount of cueing to ensure correct execution of activity. To date, skilled PT is required to address the impairments and improve function.   OBJECTIVE IMPAIRMENTS: Abnormal gait, decreased activity tolerance, decreased balance, difficulty walking, decreased strength, dizziness, and postural dysfunction.   ACTIVITY LIMITATIONS: carrying, lifting, squatting, and locomotion level  PARTICIPATION  LIMITATIONS: meal prep, cleaning, shopping, and community activity  PERSONAL FACTORS: Behavior pattern, Fitness, and Time since onset of injury/illness/exacerbation are also affecting patient's functional outcome.   REHAB POTENTIAL: Fair    CLINICAL DECISION MAKING: Stable/uncomplicated  EVALUATION COMPLEXITY: Low   GOALS: Goals reviewed with patient? No  SHORT TERM GOALS: Target date: 02/06/23 PT to be I in HEP to improve functional activity. Baseline: Goal status: IN PROGRESS  2.  PT mm strength to be increased 1/2 grade to allow pt to be able to come from sit to stand with increased ease from a chair  Baseline:  Goal status: IN PROGRESS  3. PT to be able to stand on RT LE for 10 " for decreased fall risk  Goal status: IN PROGRESS  4.  PT to be able to walk 300 ft in 2 minute period to demonstrate decreased fall risk.  Baseline:  Goal status: IN PROGRESS    LONG TERM GOALS: Target date: 02/28/23  PT to  be I  in advanced HEP to improve functional activity. Baseline:  Goal status: IN PROGRESS  2.   PT mm strength to be increased 1 grade to allow pt to be able to come from sit to stand with increased ease from a chair Baseline:  Goal status: IN PROGRESS  3.  PT to be able to ambulate 350 ft in 2 minutes for decreased fall risk  Baseline:  Goal status: IN PROGRESS  4.  PT to be able to prep and cook her meal without having to take a break  Baseline:  Goal status: IN PROGRESS  5.  PT to be able to ambulate without forward flexed position  Baseline:  Goal status: IN PROGRESS  PLAN:  PT FREQUENCY: 2x/week  PT DURATION: 6 weeks  PLANNED INTERVENTIONS: Therapeutic exercises, Therapeutic activity, Balance training, Gait training, Self Care, and Manual therapy  PLAN FOR NEXT SESSION: continue with postural, core and LE strengthening and balance activity   Virgina Organ, PT CLT [phone] 02/19/2023 1203

## 2023-02-21 ENCOUNTER — Ambulatory Visit (HOSPITAL_COMMUNITY): Payer: Medicare HMO | Admitting: Physical Therapy

## 2023-02-21 DIAGNOSIS — M6281 Muscle weakness (generalized): Secondary | ICD-10-CM

## 2023-02-21 DIAGNOSIS — R269 Unspecified abnormalities of gait and mobility: Secondary | ICD-10-CM | POA: Diagnosis not present

## 2023-02-21 NOTE — Therapy (Signed)
OUTPATIENT PHYSICAL THERAPY LOWER EXTREMITY Treatment   Patient Name: Tamara Santiago MRN: [id number] DOB:07/06/1951, 72 y.o., female Today's Date: 02/21/2023  END OF SESSION:   PT End of Session - 02/21/23 1427    Visit Number 8    Number of Visits 12    Date for PT Re-Evaluation 02/28/23    Authorization Type [employer]    Progress Note Due on Visit 10    PT Start Time 1347    PT Stop Time 1427    PT Time Calculation (min) 40 min    Equipment Utilized During Treatment Gait belt    Activity Tolerance Patient tolerated treatment well    Behavior During Therapy WFL for tasks assessed/performed             Past Medical History:  Diagnosis Date   Anxiety    Essential hypertension    GERD (gastroesophageal reflux disease)    Hyperlipidemia    Past Surgical History:  Procedure Laterality Date   CHOLECYSTECTOMY N/A 09/24/2018   Procedure: LAPAROSCOPIC CHOLECYSTECTOMY;  Surgeon: Franky Macho, MD;  Location: [facility];  Service: General;  Laterality: N/A;   TUBAL LIGATION     Patient Active Problem List   Diagnosis Date Noted   Acute cholecystitis 09/23/2018   Hypokalemia 09/23/2018   Cholangitis 09/23/2018   Sepsis 09/23/2018    PCP: Nita Sells  REFERRING PROVIDER: Suanne Marker, MD  REFERRING DIAG: R26.9 (ICD-10-CM) - Gait difficulty  THERAPY DIAG:  Gait difficulty  Muscle weakness (generalized)  Rationale for Evaluation and Treatment: Rehabilitation  ONSET DATE: almost 2 years    SUBJECTIVE STATEMENT: Pt states that it continues to be easier for her to get up.  Completing her HEP PERTINENT HISTORY: Anxiety, HTN PAIN:  Are you having pain? No  PRECAUTIONS: Fall  WEIGHT BEARING RESTRICTIONS: No  FALLS:  Has patient fallen in last 6 months? Yes. Number of falls 3  PATIENT GOALS: To be able to walk longer, no falls and have more energy  NEXT MD VISIT: as needed   OBJECTIVE:   DIAGNOSTIC FINDINGS:  Narrative & Impression  CLINICAL DATA:  Altered  mental status and multiple falls.   EXAM: MRI HEAD WITHOUT CONTRAST   TECHNIQUE: Multiplanar, multiecho pulse sequences of the brain and surrounding structures were obtained without intravenous contrast.   COMPARISON:  None.   FINDINGS: Brain: There is no acute infarction or intracranial hemorrhage. There is no intracranial mass, mass effect, or edema. There is no hydrocephalus or extra-axial fluid collection. Prominence of the ventricles and sulci reflects minor parenchymal volume loss. Patchy and confluent areas of T2 hyperintensity in the supratentorial and pontine white matter are nonspecific but probably reflect moderate chronic microvascular ischemic changes.   Vascular: Major vessel flow voids at the skull base are preserved.   Skull and upper cervical spine: Normal marrow signal is preserved.   Sinuses/Orbits: Paranasal sinuses are aerated. Orbits are unremarkable.   Other: Sella is partially empty.  Mastoid air cells are clear.   IMPRESSION: No evidence of recent infarction, hemorrhage, or mass. Moderate chronic microvascular ischemic changes.     Electronically Signed   By: Guadlupe Spanish M.D.   On: 09/27/2021 15:21     POSTURE: flexed trunk     LOWER EXTREMITY MMT:  MMT Right eval Left eval  Hip flexion 5/5 3-/5  Hip extension 2/5 3-/5  Hip abduction 4/5 5/5  Hip adduction    Hip internal rotation    Hip external rotation    Knee  flexion 5/5 3+/5  Knee extension 5/5 5/5  Ankle dorsiflexion 4/5 4/5  Ankle plantarflexion    Ankle inversion    Ankle eversion     (Blank rows = not tested)  FUNCTIONAL TESTS: 01/17/23 30 seconds chair stand test:  4 (7 is poor for age and sex; 49 is average) 2 minute walk test: 239 feet; slightly veers to the Rt, no ankle motion forward flexed postionl   Single leg stance: RT: 4"  : LT:  25"    TODAY'S TREATMENT:                                                                                                                               DATE:  02/21/23 Quadriped:  opposite arm leg with mod assist x 5 Prone hip extension x 5 B PWR: up, rock, twist and step Sittting, Standing, Prone,Quadriped and supine all x 5 reps  Single leg stance x 5 Wall arch x 15 Functional squat x 10  Sit to stand x 10  2 minute walk. 02/19/23 Wall arch x 20 Steps reciprocal x 4 reps  Sit to stand x 15 Toe raises x 10  Side step green theraband x 2 RT  Tandem stance with head turns x 5 B  Functional squat  x 10  Marching x 10  Supine: Double knee to chest one minute x 3 for one minute hold  Single knee to chest x 3 for 30" hold  Bridge hold 5" x 10  Quadriped Single arm x 10 Single leg x 5  Prone: Glut set x 10  Heel squeeze x 10  02/14/23: Side step at back hall with  theraband x 2 RT Hip extension with theraband x 15 B Toe raises x 15 Wall arch x 15  Semi-Tandem stance with head turns x 5  Sit to stand x 15 Marching x 10  Functional squat x 15 Quadriped: leg raise x 5 each  Nustep hills 3 level 3 x 6'  02/12/23 Rocker board x 2 minutes Heel raise x 10 Toe raise on slant at // x 10  Sit to stand x 10  Step up 6" step x 10  B  Tandem stance x 5 each  Functional squat x 10  Side step with green theraband x 2 with rest in between. Retro gt with green theraband x 2 Marching x 10  Nustep x 6' level 3 hills 3 02/08/23 NuStep, seat 9, arm 6, level 3, 80-90 SPM, 5 minutes Seated hamstring stretch x 30" x 3 Gastrocnemius slant board stretch x 30" x 3 Sit-to-stand, standard chair x 10 Standing TKE, GTB x 3" x 10 x 2 Forward step ups, 4" box x 10 x 2 on each Mini squats x 3" x 10 x 2 Hip vectors, RTB x 10 x 2 Retro walking, CGA, 20 ft x 2 rounds Side stepping, CGA, 20 ft x 2 rounds Stepping over hurdles 4" x 2  rounds  PATIENT EDUCATION:  Education details: HEP Person educated: Patient Education method: [employer] Education comprehension: verbalized understanding  HOME EXERCISE  PROGRAM: Access Code: [email or web address] URL: [email or web address] 02/08/2023             3/28:            - Heel Raises with Counter Support  - 2 x daily - 7 x weekly - 1 sets - 10 reps - Mini Squat with Counter Support  - 2 x daily - 7 x weekly - 1 sets - 10 reps - Sit to Stand Without Arm Support  - 2 x daily - 7 x weekly - 1 sets - 10 reps - Active Straight Leg Raise with Quad Set  - 2 x daily - 7 x weekly - 1 sets - 10 reps  Date: 01/17/2023 Prepared by: Virgina Organ  Exercises - Seated Scapular Retraction  - 3 x daily - 7 x weekly - 1 sets - 10 reps - 3-5" hold - Seated Transversus Abdominis Bracing  - 3 x daily - 7 x weekly - 1 sets - 10 reps - 3-5" hold - Supine Bridge  - 2 x daily - 7 x weekly - 1 sets - 10 reps - 3-5" hold  ASSESSMENT:  CLINICAL IMPRESSION: Therapist added PWR movement activity to treatment to address endurance and movement.  PT continues to need rest breaks.   Therapist provided mild amount of cueing to ensure correct execution of activity. To date, skilled PT is required to address the impairments and improve function.   OBJECTIVE IMPAIRMENTS: Abnormal gait, decreased activity tolerance, decreased balance, difficulty walking, decreased strength, dizziness, and postural dysfunction.   ACTIVITY LIMITATIONS: carrying, lifting, squatting, and locomotion level  PARTICIPATION LIMITATIONS: meal prep, cleaning, shopping, and community activity  PERSONAL FACTORS: Behavior pattern, Fitness, and Time since onset of injury/illness/exacerbation are also affecting patient's functional outcome.   REHAB POTENTIAL: Fair    CLINICAL DECISION MAKING: Stable/uncomplicated  EVALUATION COMPLEXITY: Low   GOALS: Goals reviewed with patient? No  SHORT TERM GOALS: Target date: 02/06/23 PT to be I in HEP to improve functional activity. Baseline: Goal status: IN PROGRESS  2.  PT mm strength to be increased 1/2 grade to allow pt to be able to come from sit to  stand with increased ease from a chair  Baseline:  Goal status: IN PROGRESS  3. PT to be able to stand on RT LE for 10 " for decreased fall risk  Goal status: IN PROGRESS  4.  PT to be able to walk 300 ft in 2 minute period to demonstrate decreased fall risk.  Baseline:  Goal status: MET    LONG TERM GOALS: Target date: 02/28/23  PT to be I  in advanced HEP to improve functional activity. Baseline:  Goal status: IN PROGRESS  2.   PT mm strength to be increased 1 grade to allow pt to be able to come from sit to stand with increased ease from a chair Baseline:  Goal status: IN PROGRESS  3.  PT to be able to ambulate 350 ft in 2 minutes for decreased fall risk  Baseline:  Goal status: IN PROGRESS  4.  PT to be able to prep and cook her meal without having to take a break  Baseline:  Goal status: IN PROGRESS  5.  PT to be able to ambulate without forward flexed position  Baseline:  Goal status: IN PROGRESS  PLAN:  PT FREQUENCY: 2x/week  PT  DURATION: 6 weeks  PLANNED INTERVENTIONS: Therapeutic exercises, Therapeutic activity, Balance training, Gait training, Self Care, and Manual therapy  PLAN FOR NEXT SESSION: continue with postural, core and LE strengthening and balance activity   Virgina Organ, PT CLT [phone] 02/21/2023 1430

## 2023-02-27 ENCOUNTER — Ambulatory Visit (HOSPITAL_COMMUNITY): Payer: Medicare HMO | Admitting: Physical Therapy

## 2023-02-27 DIAGNOSIS — M6281 Muscle weakness (generalized): Secondary | ICD-10-CM

## 2023-02-27 DIAGNOSIS — R269 Unspecified abnormalities of gait and mobility: Secondary | ICD-10-CM

## 2023-02-27 NOTE — Therapy (Signed)
OUTPATIENT PHYSICAL THERAPY LOWER EXTREMITY Treatment/prog    Patient Name: Tamara Santiago MRN: [id number] DOB:08/25/1951, 72 y.o., female Today's Date: 02/27/2023 Progress Note Reporting Period 01/17/23 to 02/27/23/  See note below for Objective Data and Assessment of Progress/Goals.     END OF SESSION:   PT End of Session - 02/27/23 1415     Visit Number 9    Number of Visits 17    Date for PT Re-Evaluation 03/29/23    Authorization Type [employer]    Progress Note Due on Visit 17    PT Start Time 1348    PT Stop Time 1426    PT Time Calculation (min) 38 min    Equipment Utilized During Treatment Gait belt    Activity Tolerance Patient tolerated treatment well    Behavior During Therapy WFL for tasks assessed/performed                Past Medical History:  Diagnosis Date   Anxiety    Essential hypertension    GERD (gastroesophageal reflux disease)    Hyperlipidemia    Past Surgical History:  Procedure Laterality Date   CHOLECYSTECTOMY N/A 09/24/2018   Procedure: LAPAROSCOPIC CHOLECYSTECTOMY;  Surgeon: Franky Macho, MD;  Location: [facility];  Service: General;  Laterality: N/A;   TUBAL LIGATION     Patient Active Problem List   Diagnosis Date Noted   Acute cholecystitis 09/23/2018   Hypokalemia 09/23/2018   Cholangitis 09/23/2018   Sepsis 09/23/2018    PCP: Nita Sells  REFERRING PROVIDER: Suanne Marker, MD  REFERRING DIAG: R26.9 (ICD-10-CM) - Gait difficulty  THERAPY DIAG:  Gait difficulty  Muscle weakness (generalized)  Rationale for Evaluation and Treatment: Rehabilitation  ONSET DATE: almost 2 years    SUBJECTIVE STATEMENT: Pt states that her Rt leg gave out on her twice this past weekend.   PERTINENT HISTORY: Anxiety, HTN PAIN:  Are you having pain? No  PRECAUTIONS: Fall  WEIGHT BEARING RESTRICTIONS: No  FALLS:  Has patient fallen in last 6 months? Yes. Number of falls 3  PATIENT GOALS: To be able to walk longer, no falls and have  more energy  NEXT MD VISIT: as needed   OBJECTIVE:   DIAGNOSTIC FINDINGS:  Narrative & Impression  CLINICAL DATA:  Altered mental status and multiple falls.   EXAM: MRI HEAD WITHOUT CONTRAST   TECHNIQUE: Multiplanar, multiecho pulse sequences of the brain and surrounding structures were obtained without intravenous contrast.   COMPARISON:  None.   FINDINGS: Brain: There is no acute infarction or intracranial hemorrhage. There is no intracranial mass, mass effect, or edema. There is no hydrocephalus or extra-axial fluid collection. Prominence of the ventricles and sulci reflects minor parenchymal volume loss. Patchy and confluent areas of T2 hyperintensity in the supratentorial and pontine white matter are nonspecific but probably reflect moderate chronic microvascular ischemic changes.   Vascular: Major vessel flow voids at the skull base are preserved.   Skull and upper cervical spine: Normal marrow signal is preserved.   Sinuses/Orbits: Paranasal sinuses are aerated. Orbits are unremarkable.   Other: Sella is partially empty.  Mastoid air cells are clear.   IMPRESSION: No evidence of recent infarction, hemorrhage, or mass. Moderate chronic microvascular ischemic changes.     Electronically Signed   By: Guadlupe Spanish M.D.   On: 09/27/2021 15:21     POSTURE: flexed trunk     LOWER EXTREMITY MMT:  MMT Right eval 02/27/23 Left eval 02/27/23  Hip flexion 5/5 5/5 3-/5 4+/5  Hip extension 2/5 4/5 3-/5 3/5  Hip abduction 4/5 4+/5 5/5 5/5  Hip adduction      Hip internal rotation      Hip external rotation      Knee flexion 5/5 5/5 3+/5 4/5  Knee extension 5/5 5/5 5/5 5/5  Ankle dorsiflexion 4/5 4+/5 4/5 5/5  Ankle plantarflexion      Ankle inversion      Ankle eversion       (Blank rows = not tested)  FUNCTIONAL TESTS: 01/17/23 30 seconds chair stand test:  4 (7 is poor for age and sex; 74 is average) 02/27/23:  7 in 30 seconds;  (7 is considered poor  for age and sex, 64 is average)  2 minute walk test: 239 feet; slightly veers to the Rt, no ankle motion forward flexed position 02/27/23/ 333 ft, with no ankle motion and forward flexed position  Single leg stance: RT: 4"  : LT:  25"            02/27/23:  RT: 4"   , LT:  5"   TODAY'S TREATMENT:                                                                                                                              DATE:  02/27/23 Reassessment  Prone hip extension x 10 B Bridge x 15 Sit to stand x 15  Tabden stance x 5 reps B 02/21/23 Quadriped:  opposite arm leg with mod assist x 5 Prone hip extension x 5 B PWR: up, rock, twist and step Sittting, Standing, Prone,Quadriped and supine all x 5 reps  Single leg stance x 5 Wall arch x 15 Functional squat x 10  Sit to stand x 10  2 minute walk. 02/19/23 Wall arch x 20 Steps reciprocal x 4 reps  Sit to stand x 15 Toe raises x 10  Side step green theraband x 2 RT  Tandem stance with head turns x 5 B  Functional squat  x 10  Marching x 10  Supine: Double knee to chest one minute x 3 for one minute hold  Single knee to chest x 3 for 30" hold  Bridge hold 5" x 10  Quadriped Single arm x 10 Single leg x 5  Prone: Glut set x 10  Heel squeeze x 10  02/14/23: Side step at back hall with  theraband x 2 RT Hip extension with theraband x 15 B Toe raises x 15 Wall arch x 15  Semi-Tandem stance with head turns x 5  Sit to stand x 15 Marching x 10  Functional squat x 15 Quadriped: leg raise x 5 each  Nustep hills 3 level 3 x 6'  02/12/23 Rocker board x 2 minutes Heel raise x 10 Toe raise on slant at // x 10  Sit to stand x 10  Step up 6" step x 10  B  Tandem stance x 5 each  Functional squat x 10  Side step with green theraband x 2 with rest in between. Retro gt with green theraband x 2 Marching x 10  Nustep x 6' level 3 hills 3 02/08/23 NuStep, seat 9, arm 6, level 3, 80-90 SPM, 5 minutes Seated hamstring stretch x 30" x  3 Gastrocnemius slant board stretch x 30" x 3 Sit-to-stand, standard chair x 10 Standing TKE, GTB x 3" x 10 x 2 Forward step ups, 4" box x 10 x 2 on each Mini squats x 3" x 10 x 2 Hip vectors, RTB x 10 x 2 Retro walking, CGA, 20 ft x 2 rounds Side stepping, CGA, 20 ft x 2 rounds Stepping over hurdles 4" x 2 rounds  PATIENT EDUCATION:  Education details: HEP Person educated: Patient Education method: Explanation and Handouts Education comprehension: verbalized understanding  HOME EXERCISE PROGRAM: Access Code: [email or web address] URL: [email or web address]  02/27/23:  hip extension             3/28:            - Heel Raises with Counter Support  - 2 x daily - 7 x weekly - 1 sets - 10 reps - Mini Squat with Counter Support  - 2 x daily - 7 x weekly - 1 sets - 10 reps - Sit to Stand Without Arm Support  - 2 x daily - 7 x weekly - 1 sets - 10 reps - Active Straight Leg Raise with Quad Set  - 2 x daily - 7 x weekly - 1 sets - 10 reps  Date: 01/17/2023 Prepared by: Virgina Organ  Exercises - Seated Scapular Retraction  - 3 x daily - 7 x weekly - 1 sets - 10 reps - 3-5" hold - Seated Transversus Abdominis Bracing  - 3 x daily - 7 x weekly - 1 sets - 10 reps - 3-5" hold - Supine Bridge  - 2 x daily - 7 x weekly - 1 sets - 10 reps - 3-5" hold  ASSESSMENT:  CLINICAL IMPRESSION: PT reassessed with good gains with functional mobility and strength.  PT has met 3/4 STG and  2/5 LTG .Pt still has deficit in gluteal mm as well as balance.  PT will continue to benefit from skilled PT to improve her strength and balance.  Requesting 4 more weeks for therapy from today   OBJECTIVE IMPAIRMENTS: Abnormal gait, decreased activity tolerance, decreased balance, difficulty walking, decreased strength, dizziness, and postural dysfunction.   ACTIVITY LIMITATIONS: carrying, lifting, squatting, and locomotion level  PARTICIPATION LIMITATIONS: meal prep, cleaning, shopping, and community  activity  PERSONAL FACTORS: Behavior pattern, Fitness, and Time since onset of injury/illness/exacerbation are also affecting patient's functional outcome.   REHAB POTENTIAL: Fair    CLINICAL DECISION MAKING: Stable/uncomplicated  EVALUATION COMPLEXITY: Low   GOALS: Goals reviewed with patient? No  SHORT TERM GOALS: Target date: 02/06/23 PT to be I in HEP to improve functional activity. Baseline: Goal status: MET  2.  PT mm strength to be increased 1/2 grade to allow pt to be able to come from sit to stand with increased ease from a chair  Baseline:  Goal status: MET  3. PT to be able to stand on RT LE for 10 " for decreased fall risk  Goal status: IN PROGRESS  4.  PT to be able to walk 300 ft in 2 minute period to demonstrate decreased fall risk.  Baseline:  Goal status: MET    LONG TERM GOALS: Target date: 02/28/23  PT to be I  in advanced HEP to improve functional activity. Baseline:  Goal status: MET  2.   PT mm strength to be increased 1 grade to allow pt to be able to come from sit to stand with increased ease from a chair Baseline:  Goal status: MET  3.  PT to be able to ambulate 350 ft in 2 minutes for decreased fall risk  Baseline:  Goal status: IN PROGRESS  4.  PT to be able to prep and cook her meal without having to take a break  Baseline:  Goal status: IN PROGRESS  5.  PT to be able to ambulate without forward flexed position  Baseline:  Goal status: IN PROGRESS  PLAN:  PT FREQUENCY: 2x/week  PT DURATION: 6 weeks;  re assess:  add four more weeks 2x a week   PLANNED INTERVENTIONS: Therapeutic exercises, Therapeutic activity, Balance training, Gait training, Self Care, and Manual therapy  PLAN FOR NEXT SESSION: continue with postural, core and LE strengthening and balance activity as well as gait training.   Virgina Organ, PT CLT [phone] 02/27/2023 1430

## 2023-03-01 ENCOUNTER — Ambulatory Visit (HOSPITAL_COMMUNITY): Payer: Medicare HMO | Admitting: Physical Therapy

## 2023-03-01 DIAGNOSIS — R269 Unspecified abnormalities of gait and mobility: Secondary | ICD-10-CM | POA: Diagnosis not present

## 2023-03-01 DIAGNOSIS — M6281 Muscle weakness (generalized): Secondary | ICD-10-CM

## 2023-03-01 NOTE — Therapy (Signed)
OUTPATIENT PHYSICAL THERAPY LOWER EXTREMITY Treatment    Patient Name: Tamara Santiago MRN: [id number] DOB:1951/06/16, 72 y.o., female Today's Date: 03/01/2023     END OF SESSION:   PT End of Session - 03/01/23 1110     Visit Number 10    Number of Visits 17    Date for PT Re-Evaluation 03/29/23    Authorization Type [employer]    Progress Note Due on Visit 17    PT Start Time 1110    PT Stop Time 1150    PT Time Calculation (min) 40 min    Equipment Utilized During Treatment Gait belt    Activity Tolerance Patient tolerated treatment well    Behavior During Therapy WFL for tasks assessed/performed                Past Medical History:  Diagnosis Date   Anxiety    Essential hypertension    GERD (gastroesophageal reflux disease)    Hyperlipidemia    Past Surgical History:  Procedure Laterality Date   CHOLECYSTECTOMY N/A 09/24/2018   Procedure: LAPAROSCOPIC CHOLECYSTECTOMY;  Surgeon: Franky Macho, MD;  Location: [facility];  Service: General;  Laterality: N/A;   TUBAL LIGATION     Patient Active Problem List   Diagnosis Date Noted   Acute cholecystitis 09/23/2018   Hypokalemia 09/23/2018   Cholangitis 09/23/2018   Sepsis (HCC) 09/23/2018    PCP: Nita Sells  REFERRING PROVIDER: Suanne Marker, MD  REFERRING DIAG: R26.9 (ICD-10-CM) - Gait difficulty  THERAPY DIAG:  Gait difficulty  Muscle weakness (generalized)  Rationale for Evaluation and Treatment: Rehabilitation  ONSET DATE: almost 2 years    SUBJECTIVE STATEMENT: Pt states that her Rt leg gave out on her twice this past weekend.   PERTINENT HISTORY: Anxiety, HTN PAIN:  Are you having pain? No  PRECAUTIONS: Fall  WEIGHT BEARING RESTRICTIONS: No  FALLS:  Has patient fallen in last 6 months? Yes. Number of falls 3  PATIENT GOALS: To be able to walk longer, no falls and have more energy  NEXT MD VISIT: as needed   OBJECTIVE:   DIAGNOSTIC FINDINGS:  Narrative & Impression  CLINICAL DATA:   Altered mental status and multiple falls.   EXAM: MRI HEAD WITHOUT CONTRAST   TECHNIQUE: Multiplanar, multiecho pulse sequences of the brain and surrounding structures were obtained without intravenous contrast.   COMPARISON:  None.   FINDINGS: Brain: There is no acute infarction or intracranial hemorrhage. There is no intracranial mass, mass effect, or edema. There is no hydrocephalus or extra-axial fluid collection. Prominence of the ventricles and sulci reflects minor parenchymal volume loss. Patchy and confluent areas of T2 hyperintensity in the supratentorial and pontine white matter are nonspecific but probably reflect moderate chronic microvascular ischemic changes.   Vascular: Major vessel flow voids at the skull base are preserved.   Skull and upper cervical spine: Normal marrow signal is preserved.   Sinuses/Orbits: Paranasal sinuses are aerated. Orbits are unremarkable.   Other: Sella is partially empty.  Mastoid air cells are clear.   IMPRESSION: No evidence of recent infarction, hemorrhage, or mass. Moderate chronic microvascular ischemic changes.     Electronically Signed   By: Guadlupe Spanish M.D.   On: 09/27/2021 15:21     POSTURE: flexed trunk     LOWER EXTREMITY MMT:  MMT Right eval 02/27/23 Left eval 02/27/23  Hip flexion 5/5 5/5 3-/5 4+/5  Hip extension 2/5 4/5 3-/5 3/5  Hip abduction 4/5 4+/5 5/5 5/5  Hip adduction  Hip internal rotation      Hip external rotation      Knee flexion 5/5 5/5 3+/5 4/5  Knee extension 5/5 5/5 5/5 5/5  Ankle dorsiflexion 4/5 4+/5 4/5 5/5  Ankle plantarflexion      Ankle inversion      Ankle eversion       (Blank rows = not tested)  FUNCTIONAL TESTS: 01/17/23 30 seconds chair stand test:  4 (7 is poor for age and sex; 63 is average) 02/27/23:  7 in 30 seconds;  (7 is considered poor for age and sex, 29 is average)  2 minute walk test: 239 feet; slightly veers to the Rt, no ankle motion forward flexed  position 02/27/23/ 333 ft, with no ankle motion and forward flexed position  Single leg stance: RT: 4"  : LT:  25"            02/27/23:  RT: 4"   , LT:  5"   TODAY'S TREATMENT:                                                                                                                              DATE:  03/01/23 Functional squat x 15  Marching with one finger assist Tandem stance on foam 5 x with Rt in front, 5 x with Lt in front Rocker board x 2 minutes Step up 6" x 15 each  Forearms on counter hip extension B with green thera-band x 10 each Side step with green thera band x 2 RT  Sit to stand x 15 Leg press 4Pl x 15 Nustep hills 3 level 5 x 8' 02/27/23 Reassessment  Prone hip extension x 10 B Bridge x 15 Sit to stand x 15  Tandem stance x 5 reps B 02/21/23 Quadriped:  opposite arm leg with mod assist x 5 Prone hip extension x 5 B PWR: up, rock, twist and step Sittting, Standing, Prone,Quadriped and supine all x 5 reps  Single leg stance x 5 Wall arch x 15 Functional squat x 10  Sit to stand x 10  2 minute walk. 02/19/23 Wall arch x 20 Steps reciprocal x 4 reps  Sit to stand x 15 Toe raises x 10  Side step green theraband x 2 RT  Tandem stance with head turns x 5 B  Functional squat  x 10  Marching x 10  Supine: Double knee to chest one minute x 3 for one minute hold  Single knee to chest x 3 for 30" hold  Bridge hold 5" x 10  Quadriped Single arm x 10 Single leg x 5  Prone: Glut set x 10  Heel squeeze x 10  02/14/23: Side step at back hall with  theraband x 2 RT Hip extension with theraband x 15 B Toe raises x 15 Wall arch x 15  Semi-Tandem stance with head turns x 5  Sit to stand x 15 Marching x 10  Functional squat  x 15 Quadriped: leg raise x 5 each  Nustep hills 3 level 3 x 6'  02/12/23 Rocker board x 2 minutes Heel raise x 10 Toe raise on slant at // x 10  Sit to stand x 10  Step up 6" step x 10  B  Tandem stance x 5 each  Functional squat x  10  Side step with green theraband x 2 with rest in between. Retro gt with green theraband x 2 Marching x 10  Nustep x 6' level 3 hills 3 02/08/23 NuStep, seat 9, arm 6, level 3, 80-90 SPM, 5 minutes Seated hamstring stretch x 30" x 3 Gastrocnemius slant board stretch x 30" x 3 Sit-to-stand, standard chair x 10 Standing TKE, GTB x 3" x 10 x 2 Forward step ups, 4" box x 10 x 2 on each Mini squats x 3" x 10 x 2 Hip vectors, RTB x 10 x 2 Retro walking, CGA, 20 ft x 2 rounds Side stepping, CGA, 20 ft x 2 rounds Stepping over hurdles 4" x 2 rounds  PATIENT EDUCATION:  Education details: HEP Person educated: Patient Education method: Explanation and Handouts Education comprehension: verbalized understanding  HOME EXERCISE PROGRAM: Access Code: [email or web address] URL: [email or web address]  02/27/23:  hip extension             3/28:            - Heel Raises with Counter Support  - 2 x daily - 7 x weekly - 1 sets - 10 reps - Mini Squat with Counter Support  - 2 x daily - 7 x weekly - 1 sets - 10 reps - Sit to Stand Without Arm Support  - 2 x daily - 7 x weekly - 1 sets - 10 reps - Active Straight Leg Raise with Quad Set  - 2 x daily - 7 x weekly - 1 sets - 10 reps  Date: 01/17/2023 Prepared by: Virgina Organ  Exercises - Seated Scapular Retraction  - 3 x daily - 7 x weekly - 1 sets - 10 reps - 3-5" hold - Seated Transversus Abdominis Bracing  - 3 x daily - 7 x weekly - 1 sets - 10 reps - 3-5" hold - Supine Bridge  - 2 x daily - 7 x weekly - 1 sets - 10 reps - 3-5" hold  ASSESSMENT:  CLINICAL IMPRESSION: Pt has decreased strength in RT LE as well as decreased balance.  Added leg press to improve gluteal strength and focused on balance this session.  Pt will continue to benefit from skilled PT to improve strength, balance and activity tolerance.  OBJECTIVE IMPAIRMENTS: Abnormal gait, decreased activity tolerance, decreased balance, difficulty walking, decreased strength,  dizziness, and postural dysfunction.   ACTIVITY LIMITATIONS: carrying, lifting, squatting, and locomotion level  PARTICIPATION LIMITATIONS: meal prep, cleaning, shopping, and community activity  PERSONAL FACTORS: Behavior pattern, Fitness, and Time since onset of injury/illness/exacerbation are also affecting patient's functional outcome.   REHAB POTENTIAL: Fair    CLINICAL DECISION MAKING: Stable/uncomplicated  EVALUATION COMPLEXITY: Low   GOALS: Goals reviewed with patient? No  SHORT TERM GOALS: Target date: 02/06/23 PT to be I in HEP to improve functional activity. Baseline: Goal status: MET  2.  PT mm strength to be increased 1/2 grade to allow pt to be able to come from sit to stand with increased ease from a chair  Baseline:  Goal status: MET  3. PT to be able to stand on RT LE for 10 " for  decreased fall risk  Goal status: IN PROGRESS  4.  PT to be able to walk 300 ft in 2 minute period to demonstrate decreased fall risk.  Baseline:  Goal status: MET    LONG TERM GOALS: Target date: 02/28/23  PT to be I  in advanced HEP to improve functional activity. Baseline:  Goal status: MET  2.   PT mm strength to be increased 1 grade to allow pt to be able to come from sit to stand with increased ease from a chair Baseline:  Goal status: MET  3.  PT to be able to ambulate 350 ft in 2 minutes for decreased fall risk  Baseline:  Goal status: IN PROGRESS  4.  PT to be able to prep and cook her meal without having to take a break  Baseline:  Goal status: IN PROGRESS  5.  PT to be able to ambulate without forward flexed position  Baseline:  Goal status: IN PROGRESS  PLAN:  PT FREQUENCY: 2x/week  PT DURATION: 6 weeks;  re assess:  add four more weeks 2x a week   PLANNED INTERVENTIONS: Therapeutic exercises, Therapeutic activity, Balance training, Gait training, Self Care, and Manual therapy  PLAN FOR NEXT SESSION: continue with postural, core and LE strengthening  and balance activity as well as gait training.   Virgina Organ, PT CLT [phone] 03/01/2023 [phone]

## 2023-03-05 ENCOUNTER — Encounter (HOSPITAL_COMMUNITY): Payer: Medicare HMO | Admitting: Physical Therapy

## 2023-03-07 ENCOUNTER — Encounter (HOSPITAL_COMMUNITY): Payer: Medicare HMO | Admitting: Physical Therapy

## 2023-03-07 ENCOUNTER — Telehealth (HOSPITAL_COMMUNITY): Payer: Self-pay | Admitting: Physical Therapy

## 2023-03-07 NOTE — Telephone Encounter (Signed)
First no show:  Pt called and reminded of next visit on answering machine.  Virgina Organ, PT CLT [phone]

## 2023-03-14 ENCOUNTER — Ambulatory Visit (HOSPITAL_COMMUNITY): Payer: Medicare HMO | Attending: Diagnostic Neuroimaging

## 2023-03-14 ENCOUNTER — Encounter (HOSPITAL_COMMUNITY): Payer: Self-pay

## 2023-03-14 DIAGNOSIS — R269 Unspecified abnormalities of gait and mobility: Secondary | ICD-10-CM | POA: Diagnosis not present

## 2023-03-14 DIAGNOSIS — M6281 Muscle weakness (generalized): Secondary | ICD-10-CM | POA: Insufficient documentation

## 2023-03-14 NOTE — Therapy (Signed)
OUTPATIENT PHYSICAL THERAPY LOWER EXTREMITY Treatment    Patient Name: Tamara Santiago MRN: [id number] DOB:07/15/1951, 72 y.o., female Today's Date: 03/14/2023     END OF SESSION:   PT End of Session - 03/14/23 1343     Visit Number 11    Number of Visits 17    Date for PT Re-Evaluation 03/29/23    Authorization Type [employer]    Progress Note Due on Visit 17    PT Start Time 1304    PT Stop Time 1343    PT Time Calculation (min) 39 min    Activity Tolerance Patient tolerated treatment well    Behavior During Therapy WFL for tasks assessed/performed                 Past Medical History:  Diagnosis Date   Anxiety    Essential hypertension    GERD (gastroesophageal reflux disease)    Hyperlipidemia    Past Surgical History:  Procedure Laterality Date   CHOLECYSTECTOMY N/A 09/24/2018   Procedure: LAPAROSCOPIC CHOLECYSTECTOMY;  Surgeon: Franky Macho, MD;  Location: [facility];  Service: General;  Laterality: N/A;   TUBAL LIGATION     Patient Active Problem List   Diagnosis Date Noted   Acute cholecystitis 09/23/2018   Hypokalemia 09/23/2018   Cholangitis 09/23/2018   Sepsis (HCC) 09/23/2018    PCP: Nita Sells  REFERRING PROVIDER: Suanne Marker, MD  REFERRING DIAG: R26.9 (ICD-10-CM) - Gait difficulty  THERAPY DIAG:  Gait difficulty  Muscle weakness (generalized)  Rationale for Evaluation and Treatment: Rehabilitation  ONSET DATE: almost 2 years    SUBJECTIVE STATEMENT:  Pt stated she feels weak today.  No fall in the last 2 weeks.  Rt knee is bothering her a little today, pain scale 3-4/10.  PERTINENT HISTORY: Anxiety, HTN PAIN:  Are you having pain? Yes 3-4/10 Rt knee intermittent sharp pain.    PRECAUTIONS: Fall  WEIGHT BEARING RESTRICTIONS: No  FALLS:  Has patient fallen in last 6 months? Yes. Number of falls 3  PATIENT GOALS: To be able to walk longer, no falls and have more energy  NEXT MD VISIT: as needed   OBJECTIVE:   DIAGNOSTIC  FINDINGS:  Narrative & Impression  CLINICAL DATA:  Altered mental status and multiple falls.   EXAM: MRI HEAD WITHOUT CONTRAST   TECHNIQUE: Multiplanar, multiecho pulse sequences of the brain and surrounding structures were obtained without intravenous contrast.   COMPARISON:  None.   FINDINGS: Brain: There is no acute infarction or intracranial hemorrhage. There is no intracranial mass, mass effect, or edema. There is no hydrocephalus or extra-axial fluid collection. Prominence of the ventricles and sulci reflects minor parenchymal volume loss. Patchy and confluent areas of T2 hyperintensity in the supratentorial and pontine white matter are nonspecific but probably reflect moderate chronic microvascular ischemic changes.   Vascular: Major vessel flow voids at the skull base are preserved.   Skull and upper cervical spine: Normal marrow signal is preserved.   Sinuses/Orbits: Paranasal sinuses are aerated. Orbits are unremarkable.   Other: Sella is partially empty.  Mastoid air cells are clear.   IMPRESSION: No evidence of recent infarction, hemorrhage, or mass. Moderate chronic microvascular ischemic changes.     Electronically Signed   By: Guadlupe Spanish M.D.   On: 09/27/2021 15:21     POSTURE: flexed trunk     LOWER EXTREMITY MMT:  MMT Right eval 02/27/23 Left eval 02/27/23  Hip flexion 5/5 5/5 3-/5 4+/5  Hip extension 2/5 4/5  3-/5 3/5  Hip abduction 4/5 4+/5 5/5 5/5  Hip adduction      Hip internal rotation      Hip external rotation      Knee flexion 5/5 5/5 3+/5 4/5  Knee extension 5/5 5/5 5/5 5/5  Ankle dorsiflexion 4/5 4+/5 4/5 5/5  Ankle plantarflexion      Ankle inversion      Ankle eversion       (Blank rows = not tested)  FUNCTIONAL TESTS: 01/17/23 30 seconds chair stand test:  4 (7 is poor for age and sex; 78 is average) 02/27/23:  7 in 30 seconds;  (7 is considered poor for age and sex, 14 is average)  2 minute walk test: 239 feet;  slightly veers to the Rt, no ankle motion forward flexed position 02/27/23/ 333 ft, with no ankle motion and forward flexed position  Single leg stance: RT: 4"  : LT:  25"            02/27/23:  RT: 4"   , LT:  5"   TODAY'S TREATMENT:                                                                                                                              DATE:  03/14/23: Functional squat x 15 with HHA Alternating Toe tapping 8in step UE opposite LE toe tapping 8in step 2x 10 Postural strengthening:  -GTB shoulder extension -GTB rows Side step with green thera band x 3 RT  Leg press 4Pl 2x 10 Lunges onto 6in step 10x  03/01/23 Functional squat x 15  Marching with one finger assist Tandem stance on foam 5 x with Rt in front, 5 x with Lt in front Rocker board x 2 minutes Step up 6" x 15 each  Forearms on counter hip extension B with green thera-band x 10 each Side step with green thera band x 2 RT  Sit to stand x 15 Leg press 4Pl x 15 Nustep hills 3 level 5 x 8' 02/27/23 Reassessment  Prone hip extension x 10 B Bridge x 15 Sit to stand x 15  Tandem stance x 5 reps B 02/21/23 Quadriped:  opposite arm leg with mod assist x 5 Prone hip extension x 5 B PWR: up, rock, twist and step Sittting, Standing, Prone,Quadriped and supine all x 5 reps  Single leg stance x 5 Wall arch x 15 Functional squat x 10  Sit to stand x 10  2 minute walk. 02/19/23 Wall arch x 20 Steps reciprocal x 4 reps  Sit to stand x 15 Toe raises x 10  Side step green theraband x 2 RT  Tandem stance with head turns x 5 B  Functional squat  x 10  Marching x 10  Supine: Double knee to chest one minute x 3 for one minute hold  Single knee to chest x 3 for 30" hold  Bridge hold 5" x 10  Quadriped  Single arm x 10 Single leg x 5  Prone: Glut set x 10  Heel squeeze x 10  02/14/23: Side step at back hall with  theraband x 2 RT Hip extension with theraband x 15 B Toe raises x 15 Wall arch x 15   Semi-Tandem stance with head turns x 5  Sit to stand x 15 Marching x 10  Functional squat x 15 Quadriped: leg raise x 5 each  Nustep hills 3 level 3 x 6'  02/12/23 Rocker board x 2 minutes Heel raise x 10 Toe raise on slant at // x 10  Sit to stand x 10  Step up 6" step x 10  B  Tandem stance x 5 each  Functional squat x 10  Side step with green theraband x 2 with rest in between. Retro gt with green theraband x 2 Marching x 10  Nustep x 6' level 3 hills 3 02/08/23 NuStep, seat 9, arm 6, level 3, 80-90 SPM, 5 minutes Seated hamstring stretch x 30" x 3 Gastrocnemius slant board stretch x 30" x 3 Sit-to-stand, standard chair x 10 Standing TKE, GTB x 3" x 10 x 2 Forward step ups, 4" box x 10 x 2 on each Mini squats x 3" x 10 x 2 Hip vectors, RTB x 10 x 2 Retro walking, CGA, 20 ft x 2 rounds Side stepping, CGA, 20 ft x 2 rounds Stepping over hurdles 4" x 2 rounds  PATIENT EDUCATION:  Education details: HEP Person educated: Patient Education method: Explanation and Handouts Education comprehension: verbalized understanding  HOME EXERCISE PROGRAM: Access Code: [email or web address] URL: [email or web address]  02/27/23:  hip extension             3/28:            - Heel Raises with Counter Support  - 2 x daily - 7 x weekly - 1 sets - 10 reps - Mini Squat with Counter Support  - 2 x daily - 7 x weekly - 1 sets - 10 reps - Sit to Stand Without Arm Support  - 2 x daily - 7 x weekly - 1 sets - 10 reps - Active Straight Leg Raise with Quad Set  - 2 x daily - 7 x weekly - 1 sets - 10 reps  Date: 01/17/2023 Prepared by: Virgina Organ  Exercises - Seated Scapular Retraction  - 3 x daily - 7 x weekly - 1 sets - 10 reps - 3-5" hold - Seated Transversus Abdominis Bracing  - 3 x daily - 7 x weekly - 1 sets - 10 reps - 3-5" hold - Supine Bridge  - 2 x daily - 7 x weekly - 1 sets - 10 reps - 3-5" hold  ASSESSMENT:  CLINICAL IMPRESSION: Pt educated importance of posture with  balance based exercises, added theraband exercises for postural strengthening with multimodal cueing to improve form and mechanics.  Continue session focus with LE strengthening with SBA and cueing for mechanics, intermittent HHA required for balance recovery.  Pt required seated rest breaks through session per fatigue.  OBJECTIVE IMPAIRMENTS: Abnormal gait, decreased activity tolerance, decreased balance, difficulty walking, decreased strength, dizziness, and postural dysfunction.   ACTIVITY LIMITATIONS: carrying, lifting, squatting, and locomotion level  PARTICIPATION LIMITATIONS: meal prep, cleaning, shopping, and community activity  PERSONAL FACTORS: Behavior pattern, Fitness, and Time since onset of injury/illness/exacerbation are also affecting patient's functional outcome.   REHAB POTENTIAL: Fair    CLINICAL DECISION MAKING: Stable/uncomplicated  EVALUATION COMPLEXITY: Low  GOALS: Goals reviewed with patient? No  SHORT TERM GOALS: Target date: 02/06/23 PT to be I in HEP to improve functional activity. Baseline: Goal status: MET  2.  PT mm strength to be increased 1/2 grade to allow pt to be able to come from sit to stand with increased ease from a chair  Baseline:  Goal status: MET  3. PT to be able to stand on RT LE for 10 " for decreased fall risk  Goal status: IN PROGRESS  4.  PT to be able to walk 300 ft in 2 minute period to demonstrate decreased fall risk.  Baseline:  Goal status: MET    LONG TERM GOALS: Target date: 02/28/23  PT to be I  in advanced HEP to improve functional activity. Baseline:  Goal status: MET  2.   PT mm strength to be increased 1 grade to allow pt to be able to come from sit to stand with increased ease from a chair Baseline:  Goal status: MET  3.  PT to be able to ambulate 350 ft in 2 minutes for decreased fall risk  Baseline:  Goal status: IN PROGRESS  4.  PT to be able to prep and cook her meal without having to take a break   Baseline:  Goal status: IN PROGRESS  5.  PT to be able to ambulate without forward flexed position  Baseline:  Goal status: IN PROGRESS  PLAN:  PT FREQUENCY: 2x/week  PT DURATION: 6 weeks;  re assess:  add four more weeks 2x a week   PLANNED INTERVENTIONS: Therapeutic exercises, Therapeutic activity, Balance training, Gait training, Self Care, and Manual therapy  PLAN FOR NEXT SESSION: continue with postural, core and LE strengthening and balance activity as well as gait training.   Becky Sax, LPTA/CLT; [place] [phone]  03/14/2023

## 2023-03-19 ENCOUNTER — Ambulatory Visit (HOSPITAL_COMMUNITY): Payer: Medicare HMO | Admitting: Physical Therapy

## 2023-03-19 DIAGNOSIS — M6281 Muscle weakness (generalized): Secondary | ICD-10-CM | POA: Diagnosis not present

## 2023-03-19 DIAGNOSIS — R269 Unspecified abnormalities of gait and mobility: Secondary | ICD-10-CM

## 2023-03-19 NOTE — Therapy (Signed)
OUTPATIENT PHYSICAL THERAPY LOWER EXTREMITY Treatment    Patient Name: Tamara Santiago MRN: [id number] DOB:20-Dec-1950, 72 y.o., female Today's Date: 03/19/2023     END OF SESSION:   PT End of Session - 03/19/23 1510     Visit Number 12    Number of Visits 17    Date for PT Re-Evaluation 03/29/23    Authorization Type [employer]    Progress Note Due on Visit 17    PT Start Time 1432    PT Stop Time 1510    PT Time Calculation (min) 38 min    Activity Tolerance Patient tolerated treatment well    Behavior During Therapy WFL for tasks assessed/performed                 Past Medical History:  Diagnosis Date   Anxiety    Essential hypertension    GERD (gastroesophageal reflux disease)    Hyperlipidemia    Past Surgical History:  Procedure Laterality Date   CHOLECYSTECTOMY N/A 09/24/2018   Procedure: LAPAROSCOPIC CHOLECYSTECTOMY;  Surgeon: Franky Macho, MD;  Location: [facility];  Service: General;  Laterality: N/A;   TUBAL LIGATION     Patient Active Problem List   Diagnosis Date Noted   Acute cholecystitis 09/23/2018   Hypokalemia 09/23/2018   Cholangitis 09/23/2018   Sepsis (HCC) 09/23/2018    PCP: Nita Sells  REFERRING PROVIDER: Suanne Marker, MD  REFERRING DIAG: R26.9 (ICD-10-CM) - Gait difficulty  THERAPY DIAG:  Gait difficulty  Muscle weakness (generalized)  Rationale for Evaluation and Treatment: Rehabilitation  ONSET DATE: almost 2 years    SUBJECTIVE STATEMENT:  Pt has no complaints.   PERTINENT HISTORY: Anxiety, HTN PAIN:  Are you having pain? Yes 3-4/10 Rt knee intermittent sharp pain.    PRECAUTIONS: Fall  WEIGHT BEARING RESTRICTIONS: No  FALLS:  Has patient fallen in last 6 months? Yes. Number of falls 3  PATIENT GOALS: To be able to walk longer, no falls and have more energy  NEXT MD VISIT: as needed   OBJECTIVE:   DIAGNOSTIC FINDINGS:  Narrative & Impression  CLINICAL DATA:  Altered mental status and multiple falls.    EXAM: MRI HEAD WITHOUT CONTRAST   TECHNIQUE: Multiplanar, multiecho pulse sequences of the brain and surrounding structures were obtained without intravenous contrast.   COMPARISON:  None.   FINDINGS: Brain: There is no acute infarction or intracranial hemorrhage. There is no intracranial mass, mass effect, or edema. There is no hydrocephalus or extra-axial fluid collection. Prominence of the ventricles and sulci reflects minor parenchymal volume loss. Patchy and confluent areas of T2 hyperintensity in the supratentorial and pontine white matter are nonspecific but probably reflect moderate chronic microvascular ischemic changes.   Vascular: Major vessel flow voids at the skull base are preserved.   Skull and upper cervical spine: Normal marrow signal is preserved.   Sinuses/Orbits: Paranasal sinuses are aerated. Orbits are unremarkable.   Other: Sella is partially empty.  Mastoid air cells are clear.   IMPRESSION: No evidence of recent infarction, hemorrhage, or mass. Moderate chronic microvascular ischemic changes.     Electronically Signed   By: Guadlupe Spanish M.D.   On: 09/27/2021 15:21     POSTURE: flexed trunk     LOWER EXTREMITY MMT:  MMT Right eval 02/27/23 Left eval 02/27/23  Hip flexion 5/5 5/5 3-/5 4+/5  Hip extension 2/5 4/5 3-/5 3/5  Hip abduction 4/5 4+/5 5/5 5/5  Hip adduction      Hip internal rotation  Hip external rotation      Knee flexion 5/5 5/5 3+/5 4/5  Knee extension 5/5 5/5 5/5 5/5  Ankle dorsiflexion 4/5 4+/5 4/5 5/5  Ankle plantarflexion      Ankle inversion      Ankle eversion       (Blank rows = not tested)  FUNCTIONAL TESTS: 01/17/23 30 seconds chair stand test:  4 (7 is poor for age and sex; 53 is average) 02/27/23:  7 in 30 seconds;  (7 is considered poor for age and sex, 59 is average)  2 minute walk test: 239 feet; slightly veers to the Rt, no ankle motion forward flexed position 02/27/23/ 333 ft, with no ankle motion  and forward flexed position  Single leg stance: RT: 4"  : LT:  25"            02/27/23:  RT: 4"   , LT:  5"   TODAY'S TREATMENT:                                                                                                                              DATE:  03/19/23: Wall arch x 15 Opposite arm raise with marching x 15-against wall  B UE flexion keeping back against the wall x 10  Sit to stand x 15  Side step with green theraband x 1 RT - pt unable to complete second trip which therapist had planned.  Postural exercises green theraband Scapular retraction x 10 Rows x 10  Leg press 4 PL x 15 Functional squat x 10   03/14/23: Functional squat x 15 with HHA Alternating Toe tapping 8in step UE opposite LE toe tapping 8in step 2x 10 Postural strengthening:  -GTB shoulder extension -GTB rows Side step with green thera band x 3 RT  Leg press 4Pl 2x 10 Lunges onto 6in step 10x  03/01/23 Functional squat x 15  Marching with one finger assist Tandem stance on foam 5 x with Rt in front, 5 x with Lt in front Rocker board x 2 minutes Step up 6" x 15 each  Forearms on counter hip extension B with green thera-band x 10 each Side step with green thera band x 2 RT  Sit to stand x 15 Leg press 4Pl x 15 Nustep hills 3 level 5 x 8' 02/27/23 Reassessment  Prone hip extension x 10 B Bridge x 15 Sit to stand x 15  Tandem stance x 5 reps B 02/21/23 Quadriped:  opposite arm leg with mod assist x 5 Prone hip extension x 5 B PWR: up, rock, twist and step Sittting, Standing, Prone,Quadriped and supine all x 5 reps  Single leg stance x 5 Wall arch x 15 Functional squat x 10  Sit to stand x 10  2 minute walk. 02/19/23 Wall arch x 20 Steps reciprocal x 4 reps  Sit to stand x 15 Toe raises x 10  Side step green theraband x 2 RT  Tandem stance with head turns x 5 B  Functional squat  x 10  Marching x 10  Supine: Double knee to chest one minute x 3 for one minute hold  Single knee to  chest x 3 for 30" hold  Bridge hold 5" x 10  Quadriped Single arm x 10 Single leg x 5  Prone: Glut set x 10  Heel squeeze x 10  02/14/23: Side step at back hall with  theraband x 2 RT Hip extension with theraband x 15 B Toe raises x 15 Wall arch x 15  Semi-Tandem stance with head turns x 5  Sit to stand x 15 Marching x 10  Functional squat x 15 Quadriped: leg raise x 5 each  Nustep hills 3 level 3 x 6'  PATIENT EDUCATION:  Education details: HEP Person educated: Patient Education method: Chief Technology Officer Education comprehension: verbalized understanding  HOME EXERCISE PROGRAM: Access Code: [email or web address] URL: [email or web address]  02/27/23:  hip extension             3/28:            - Heel Raises with Counter Support  - 2 x daily - 7 x weekly - 1 sets - 10 reps - Mini Squat with Counter Support  - 2 x daily - 7 x weekly - 1 sets - 10 reps - Sit to Stand Without Arm Support  - 2 x daily - 7 x weekly - 1 sets - 10 reps - Active Straight Leg Raise with Quad Set  - 2 x daily - 7 x weekly - 1 sets - 10 reps  Date: 01/17/2023 Prepared by: Virgina Organ  Exercises - Seated Scapular Retraction  - 3 x daily - 7 x weekly - 1 sets - 10 reps - 3-5" hold - Seated Transversus Abdominis Bracing  - 3 x daily - 7 x weekly - 1 sets - 10 reps - 3-5" hold - Supine Bridge  - 2 x daily - 7 x weekly - 1 sets - 10 reps - 3-5" hold  ASSESSMENT:  CLINICAL IMPRESSION: Pt needs several resting breaks in between activities throughout treatment today. Therapist did not have pt complete the nustep today as she was becoming SOB with minimal exertion, urged pt to see MD about this.  Pt will continue to benefit from skilled care for decreased strength, activity tolerance and gait disturbance.   OBJECTIVE IMPAIRMENTS: Abnormal gait, decreased activity tolerance, decreased balance, difficulty walking, decreased strength, dizziness, and postural dysfunction.   ACTIVITY LIMITATIONS:  carrying, lifting, squatting, and locomotion level  PARTICIPATION LIMITATIONS: meal prep, cleaning, shopping, and community activity  PERSONAL FACTORS: Behavior pattern, Fitness, and Time since onset of injury/illness/exacerbation are also affecting patient's functional outcome.   REHAB POTENTIAL: Fair    CLINICAL DECISION MAKING: Stable/uncomplicated  EVALUATION COMPLEXITY: Low   GOALS: Goals reviewed with patient? No  SHORT TERM GOALS: Target date: 02/06/23 PT to be I in HEP to improve functional activity. Baseline: Goal status: MET  2.  PT mm strength to be increased 1/2 grade to allow pt to be able to come from sit to stand with increased ease from a chair  Baseline:  Goal status: MET  3. PT to be able to stand on RT LE for 10 " for decreased fall risk  Goal status: IN PROGRESS  4.  PT to be able to walk 300 ft in 2 minute period to demonstrate decreased fall risk.  Baseline:  Goal status: MET  LONG TERM GOALS: Target date: 02/28/23  PT to be I  in advanced HEP to improve functional activity. Baseline:  Goal status: MET  2.   PT mm strength to be increased 1 grade to allow pt to be able to come from sit to stand with increased ease from a chair Baseline:  Goal status: MET  3.  PT to be able to ambulate 350 ft in 2 minutes for decreased fall risk  Baseline:  Goal status: IN PROGRESS  4.  PT to be able to prep and cook her meal without having to take a break  Baseline:  Goal status: IN PROGRESS  5.  PT to be able to ambulate without forward flexed position  Baseline:  Goal status: IN PROGRESS  PLAN:  PT FREQUENCY: 2x/week  PT DURATION: 6 weeks;  re assess:  add four more weeks 2x a week   PLANNED INTERVENTIONS: Therapeutic exercises, Therapeutic activity, Balance training, Gait training, Self Care, and Manual therapy  PLAN FOR NEXT SESSION: continue with postural, core and LE strengthening and balance activity as well as gait training.  Virgina Organ, PT CLT [phone]  03/19/2023

## 2023-03-21 ENCOUNTER — Encounter (HOSPITAL_COMMUNITY): Payer: Medicare HMO | Admitting: Physical Therapy

## 2023-03-26 ENCOUNTER — Other Ambulatory Visit (HOSPITAL_COMMUNITY): Payer: Self-pay | Admitting: Family Medicine

## 2023-03-26 ENCOUNTER — Encounter (HOSPITAL_COMMUNITY): Payer: Medicare HMO

## 2023-03-26 ENCOUNTER — Ambulatory Visit (HOSPITAL_COMMUNITY)
Admission: RE | Admit: 2023-03-26 | Discharge: 2023-03-26 | Disposition: A | Discharge status: Home / Self Care | Payer: Medicare HMO | Source: Ambulatory Visit | Attending: Family Medicine | Admitting: Family Medicine

## 2023-03-26 DIAGNOSIS — R0602 Shortness of breath: Secondary | ICD-10-CM | POA: Diagnosis not present

## 2023-03-26 DIAGNOSIS — R0600 Dyspnea, unspecified: Secondary | ICD-10-CM

## 2023-03-26 DIAGNOSIS — R319 Hematuria, unspecified: Secondary | ICD-10-CM | POA: Diagnosis not present

## 2023-03-26 DIAGNOSIS — I10 Essential (primary) hypertension: Secondary | ICD-10-CM | POA: Diagnosis not present

## 2023-03-26 DIAGNOSIS — Z79899 Other long term (current) drug therapy: Secondary | ICD-10-CM | POA: Diagnosis not present

## 2023-03-26 DIAGNOSIS — E539 Vitamin B deficiency, unspecified: Secondary | ICD-10-CM | POA: Diagnosis not present

## 2023-03-26 DIAGNOSIS — R82998 Other abnormal findings in urine: Secondary | ICD-10-CM | POA: Diagnosis not present

## 2023-03-26 DIAGNOSIS — E538 Deficiency of other specified B group vitamins: Secondary | ICD-10-CM | POA: Diagnosis not present

## 2023-03-28 ENCOUNTER — Encounter (HOSPITAL_COMMUNITY): Payer: Medicare HMO | Admitting: Physical Therapy

## 2023-03-28 ENCOUNTER — Ambulatory Visit: Payer: Medicare HMO | Attending: Nurse Practitioner | Admitting: Cardiology

## 2023-03-28 ENCOUNTER — Telehealth: Payer: Self-pay | Admitting: Cardiology

## 2023-03-28 ENCOUNTER — Telehealth (HOSPITAL_COMMUNITY): Payer: Self-pay | Admitting: Physical Therapy

## 2023-03-28 ENCOUNTER — Ambulatory Visit: Payer: Medicare HMO | Admitting: Nurse Practitioner

## 2023-03-28 ENCOUNTER — Encounter: Payer: Self-pay | Admitting: Cardiology

## 2023-03-28 VITALS — BP 134/82 | HR 80 | Ht 64.0 in | Wt 175.6 lb

## 2023-03-28 DIAGNOSIS — E782 Mixed hyperlipidemia: Secondary | ICD-10-CM

## 2023-03-28 DIAGNOSIS — R262 Difficulty in walking, not elsewhere classified: Secondary | ICD-10-CM

## 2023-03-28 DIAGNOSIS — I10 Essential (primary) hypertension: Secondary | ICD-10-CM | POA: Diagnosis not present

## 2023-03-28 DIAGNOSIS — R7989 Other specified abnormal findings of blood chemistry: Secondary | ICD-10-CM

## 2023-03-28 DIAGNOSIS — I517 Cardiomegaly: Secondary | ICD-10-CM

## 2023-03-28 DIAGNOSIS — R0602 Shortness of breath: Secondary | ICD-10-CM | POA: Diagnosis not present

## 2023-03-28 NOTE — Patient Instructions (Signed)
Medication Instructions:   *If you need a refill on your cardiac medications before your next appointment, please call your pharmacy*   Lab Work: In 2 weeks:  -BMET  If you have labs (blood work) drawn today and your tests are completely normal, you will receive your results only by: MyChart Message (if you have MyChart) OR A paper copy in the mail If you have any lab test that is abnormal or we need to change your treatment, we will call you to review the results.   Testing/Procedures: Your physician has requested that you have an echocardiogram. Echocardiography is a painless test that uses sound waves to create images of your heart. It provides your doctor with information about the size and shape of your heart and how well your heart's chambers and valves are working. This procedure takes approximately one hour. There are no restrictions for this procedure. Please do NOT wear cologne, perfume, aftershave, or lotions (deodorant is allowed). Please arrive 15 minutes prior to your appointment time.    Follow-Up: At [facility], you and your health needs are our priority.  As part of our continuing mission to provide you with exceptional heart care, we have created designated Provider Care Teams.  These Care Teams include your primary Cardiologist (physician) and Advanced Practice Providers (APPs -  Physician Assistants and Nurse Practitioners) who all work together to provide you with the care you need, when you need it.  We recommend signing up for the patient portal called "MyChart".  Sign up information is provided on this After Visit Summary.  MyChart is used to connect with patients for Virtual Visits (Telemedicine).  Patients are able to view lab/test results, encounter notes, upcoming appointments, etc.  Non-urgent messages can be sent to your provider as well.   To learn more about what you can do with MyChart, go to [email or web address].    Your next appointment:    Follow up with Dr. Diona Browner after your Echo  Other Instructions

## 2023-03-28 NOTE — Telephone Encounter (Signed)
Labs printed and placed on providers desk

## 2023-03-28 NOTE — Telephone Encounter (Signed)
Second no show/Discharge>  PHYSICAL THERAPY DISCHARGE SUMMARY  Visits from Start of Care: 12  Current functional level related to goals / functional outcomes: Pt limited secondary to SOB with activity.  Called PT [facility] talked. Scans showed fluid on the heart.  Pt requested to be discharged at this time.    Remaining deficits: Activity tolerance,(cardiac in nature) balance   Education / Equipment: HEP   Patient agrees to discharge. Patient goals were partially met. Patient is being discharged due to a change in medical status.  Virgina Organ, PT CLT [phone]

## 2023-03-28 NOTE — Telephone Encounter (Signed)
Noted. Will give labs to provider

## 2023-03-28 NOTE — Progress Notes (Signed)
Cardiology Office Note:   Date:  03/28/2023  ID:  Tamara Santiago, DOB 10/26/1951, MRN [id number]  History of Present Illness:   Tamara Santiago is a 72 y.o. female with a past medical history of hypertension, hypercholesterolemia, gastroesophageal reflux disease, cardiomegaly, shortness of breath, chronic right bundle branch block who is here today for follow-up after recent appointment with her PCP who sent her for a chest x-ray and labs.  She was last seen in clinic 02/27/2021 by Dr. Diona Browner.  At that time she had complaints of shortness of breath, feeling tired, and fatigued.  It was noted that she had had mild cardiomegaly since December 2021 with no history of a cardiomyopathy she was scheduled for an echocardiogram.  Her blood pressure was well-controlled and she was continued on her current medication regimen.  Echocardiogram completed 03/31/2021 revealed an LVEF of 55 to 60%, no regional wall motion abnormality, small to moderate pericardial effusion was present with trivial mitral valve regurgitation, mild aortic valve regurgitation.  Limited echo was repeated 10/03/2021 which revealed a mild pericardial effusion was present, LVEF 55-60%.  Lexiscan MPI was completed 10/23/2021 which revealed a low risk study with no ischemia or infarction noted.  She followed up with her primary care provider yesterday with complaints of fatigue and shortness of breath was sent for chest x-ray and labs and subsequently was scheduled to follow-up here today.  She returns to clinic today accompanied by her daughter.  She stated that she went to her primary care because she had been short of breath that is worsening over the past 1 to 2 weeks, fatigue, and thought that she had a urinary tract infection.  Chest x-ray was completed and revealed mild cardiomegaly which has been present on films for the past several years that is unchanged.  She did have interstitial markings which could have been consistent with  pulmonary edema but most concerning it was she had an elevated BNP of greater than 2000.  We had labs that were sent from her PCP unfortunately no note according to the daughter the patient was started on a fluid pill.  She continues to remain fatigued, short of breath, and generalized weakness where she had previously been undergoing physical therapy but has had to stop her therapy due to her shortness of breath.  She denies any recent hospitalizations or visits to the emergency department.  ROS: 10 point review of systems has been completed and considered negative with exception of what is been listed in HPI  Studies Reviewed:    EKG: No new tracings were completed today  TTE Limited 10/03/21 1. A mild pericardial effusion is present posterior to the LV and appears  smaller compared to prior study on 03/2021.   2. Left ventricular ejection fraction, by estimation, is 55 to 60%. The  left ventricle has normal function. There is mild concentric left  ventricular hypertrophy.   3. Right ventricular systolic function is normal. The right ventricular  size is normal.   4. The mitral valve is normal in structure. Trivial mitral valve  regurgitation.   5. The inferior vena cava is normal in size with greater than 50%  respiratory variability, suggesting right atrial pressure of 3 mmHg.   6. Aortic valve regurgitation is mild to moderate.   Lexiscan MPI 10/23/21   The study is normal. The study is low risk.   No ST deviation was noted.   LV perfusion is normal. There is no evidence of ischemia. There is no evidence of  infarction.   Left ventricular function is normal. End diastolic cavity size is normal. End systolic cavity size is normal.   Normal resting and stress perfusion. No ischemia or infarction EF 63%  TTE 03/31/21 1. Left ventricular ejection fraction, by estimation, is 55 to 60%. The  left ventricle has normal function. The left ventricle has no regional  wall motion  abnormalities. Left ventricular diastolic parameters are  indeterminate.   2. Right ventricular systolic function is normal. The right ventricular  size is normal. There is normal pulmonary artery systolic pressure. The  estimated right ventricular systolic pressure is 25.8 mmHg.   3. A small to moderate pericardial effusion is present. The pericardial  effusion is posterior to the left ventricle.   4. The mitral valve is grossly normal. Trivial mitral valve  regurgitation.   5. The aortic valve is tricuspid. Aortic valve regurgitation is mild.   6. The inferior vena cava is normal in size with greater than 50%  respiratory variability, suggesting right atrial pressure of 3 mmHg.   Risk Assessment/Calculations:              Physical Exam:   VS:  BP 134/82   Pulse 80   Ht 5\' 4"  (1.626 m)   Wt 175 lb 9.6 oz (79.7 kg)   SpO2 94%   BMI 30.14 kg/m    Wt Readings from Last 3 Encounters:  03/28/23 175 lb 9.6 oz (79.7 kg)  12/31/22 174 lb (78.9 kg)  02/27/21 198 lb 12.8 oz (90.2 kg)     GEN: Well nourished, well developed in no acute distress NECK: No JVD; No carotid bruits CARDIAC: RRR, I/VI systolic murmur, without rubs, gallops RESPIRATORY: Clear upper lobes with slightly diminished bases to auscultation without rales, wheezing or rhonchi  ABDOMEN: Soft, non-tender, non-distended EXTREMITIES:  No edema; No deformity   ASSESSMENT AND PLAN:   Worsening shortness of breath and dyspnea on exertion that has been progressive over the last several weeks.  Patient just recently had blood work done at her PCP and found to have a BNP greater than 2000 and a chest x-ray that had increased interstitial markings consistent with CHF.  She has been scheduled for repeat echocardiogram to evaluate for cardiomyopathy.  She was just started on furosemide 20 mg daily yesterday by her PCP.  She has been scheduled for a BMP in 2 weeks after starting diuretic therapy to reevaluate kidney function and  electrolytes.  She has been continued on potassium 10 mEq twice daily since being on diuretic therapy.  She is also been encouraged to watch her fluid intake and avoid high sodium foods.  These are prepackaged preprocessed or fast foods.  She is also been advised that she needs to weigh herself daily in the morning after she urinates and keep a running list of what her weight is.  If there is a weight increase of 2 to 3 pounds from 1 morning to the next she is to notify the office if she may need adjustments made to her diuretic therapy.  Further recommendations to follow after echocardiogram has been completed.  Essential hypertension blood pressure today 130/82.  She has been continued on losartan 100 mg daily, metoprolol 50 mg daily and furosemide 20 mg daily.  Encouraged to monitor her pressures at home.  Mixed hyperlipidemia where she is continued on rosuvastatin 10 mg daily.  This continues to be followed by her PCP.  Altered ambulatory function which she is followed by neurology and had  recently been undergoing physical therapy.  Disposition patient return to clinic to see MD/APP once echocardiogram is completed or sooner if needed.  Of note both pharmacies and patient's PCP has been called to reconcile medications as patient is unsure of any medications that she is currently on has been advised that she needs to bring her medications with her when she comes to appointments.        Signed, Brinlee Gambrell, NP

## 2023-03-28 NOTE — Telephone Encounter (Signed)
Caller stated she faxed results of patient's last BMP for review at patient's visit today.

## 2023-04-15 DIAGNOSIS — R0602 Shortness of breath: Secondary | ICD-10-CM | POA: Diagnosis not present

## 2023-04-16 LAB — BASIC METABOLIC PANEL
BUN/Creatinine Ratio: 13 (ref 12–28)
BUN: 16 mg/dL (ref 8–27)
CO2: 23 mmol/L (ref 20–29)
Calcium: 9.5 mg/dL (ref 8.7–10.3)
Chloride: 103 mmol/L (ref 96–106)
Creatinine, Ser: 1.27 mg/dL — ABNORMAL HIGH (ref 0.57–1.00)
Glucose: 223 mg/dL — ABNORMAL HIGH (ref 70–99)
Potassium: 4.3 mmol/L (ref 3.5–5.2)
Sodium: 143 mmol/L (ref 134–144)
eGFR: 45 mL/min/{1.73_m2} — ABNORMAL LOW (ref >59)

## 2023-04-16 NOTE — Progress Notes (Signed)
Recommend decreasing furosemide to as needed from daily dosing. Appears to be a little dehydrated on labs. Continue to weight daily and can take furosemide for weight gain of 2-3 pounds over night. Limit salt intake. Repeat BMP in 2 weeks.

## 2023-04-17 ENCOUNTER — Telehealth: Payer: Self-pay | Admitting: Cardiology

## 2023-04-17 NOTE — Telephone Encounter (Signed)
Daughter is calling with questions concerning patient medication. Please advise

## 2023-04-17 NOTE — Telephone Encounter (Signed)
Spoke with pt's daughter who was requesting a refill of Lasix. Daughter states that she also called the PCP and that office and they refilled it.

## 2023-04-18 ENCOUNTER — Telehealth: Payer: Self-pay

## 2023-04-18 DIAGNOSIS — Z79899 Other long term (current) drug therapy: Secondary | ICD-10-CM

## 2023-04-18 MED ORDER — FUROSEMIDE 20 MG PO TABS: 20.0000 mg | ORAL_TABLET | Freq: Every day | ORAL | 3 refills | Status: DC | PRN

## 2023-04-18 NOTE — Telephone Encounter (Signed)
Results discussed with pt, will decrease lasix to 20 mg qd PRN and repeat bmet in 2 weeks,will also limit salt to 2 grams/day

## 2023-04-18 NOTE — Telephone Encounter (Signed)
-----   Message from Charlsie Quest, NP sent at 04/16/2023  3:59 PM EDT ----- Recommend decreasing furosemide to as needed from daily dosing. Appears to be a little dehydrated on labs. Continue to weight daily and can take furosemide for weight gain of 2-3 pounds over night. Limit salt intake. Repeat BMP in 2 weeks.

## 2023-04-19 DIAGNOSIS — J449 Chronic obstructive pulmonary disease, unspecified: Secondary | ICD-10-CM | POA: Diagnosis not present

## 2023-04-19 DIAGNOSIS — J309 Allergic rhinitis, unspecified: Secondary | ICD-10-CM | POA: Diagnosis not present

## 2023-04-19 DIAGNOSIS — R269 Unspecified abnormalities of gait and mobility: Secondary | ICD-10-CM | POA: Diagnosis not present

## 2023-04-19 DIAGNOSIS — M8580 Other specified disorders of bone density and structure, unspecified site: Secondary | ICD-10-CM | POA: Diagnosis not present

## 2023-04-19 DIAGNOSIS — M1990 Unspecified osteoarthritis, unspecified site: Secondary | ICD-10-CM | POA: Diagnosis not present

## 2023-04-19 DIAGNOSIS — I509 Heart failure, unspecified: Secondary | ICD-10-CM | POA: Diagnosis not present

## 2023-04-19 DIAGNOSIS — Z008 Encounter for other general examination: Secondary | ICD-10-CM | POA: Diagnosis not present

## 2023-04-19 DIAGNOSIS — E1151 Type 2 diabetes mellitus with diabetic peripheral angiopathy without gangrene: Secondary | ICD-10-CM | POA: Diagnosis not present

## 2023-04-19 DIAGNOSIS — E785 Hyperlipidemia, unspecified: Secondary | ICD-10-CM | POA: Diagnosis not present

## 2023-04-19 DIAGNOSIS — F419 Anxiety disorder, unspecified: Secondary | ICD-10-CM | POA: Diagnosis not present

## 2023-04-19 DIAGNOSIS — E876 Hypokalemia: Secondary | ICD-10-CM | POA: Diagnosis not present

## 2023-04-19 DIAGNOSIS — I471 Supraventricular tachycardia, unspecified: Secondary | ICD-10-CM | POA: Diagnosis not present

## 2023-04-19 DIAGNOSIS — K219 Gastro-esophageal reflux disease without esophagitis: Secondary | ICD-10-CM | POA: Diagnosis not present

## 2023-04-25 DIAGNOSIS — E538 Deficiency of other specified B group vitamins: Secondary | ICD-10-CM | POA: Diagnosis not present

## 2023-04-25 DIAGNOSIS — E782 Mixed hyperlipidemia: Secondary | ICD-10-CM | POA: Diagnosis not present

## 2023-04-25 DIAGNOSIS — E1169 Type 2 diabetes mellitus with other specified complication: Secondary | ICD-10-CM | POA: Diagnosis not present

## 2023-04-30 DIAGNOSIS — E669 Obesity, unspecified: Secondary | ICD-10-CM | POA: Diagnosis not present

## 2023-04-30 DIAGNOSIS — E538 Deficiency of other specified B group vitamins: Secondary | ICD-10-CM | POA: Diagnosis not present

## 2023-04-30 DIAGNOSIS — E1169 Type 2 diabetes mellitus with other specified complication: Secondary | ICD-10-CM | POA: Diagnosis not present

## 2023-04-30 DIAGNOSIS — I517 Cardiomegaly: Secondary | ICD-10-CM | POA: Diagnosis not present

## 2023-04-30 DIAGNOSIS — F0390 Unspecified dementia without behavioral disturbance: Secondary | ICD-10-CM | POA: Diagnosis not present

## 2023-04-30 DIAGNOSIS — K219 Gastro-esophageal reflux disease without esophagitis: Secondary | ICD-10-CM | POA: Diagnosis not present

## 2023-04-30 DIAGNOSIS — I10 Essential (primary) hypertension: Secondary | ICD-10-CM | POA: Diagnosis not present

## 2023-04-30 DIAGNOSIS — E782 Mixed hyperlipidemia: Secondary | ICD-10-CM | POA: Diagnosis not present

## 2023-04-30 DIAGNOSIS — F17210 Nicotine dependence, cigarettes, uncomplicated: Secondary | ICD-10-CM | POA: Diagnosis not present

## 2023-04-30 DIAGNOSIS — H9190 Unspecified hearing loss, unspecified ear: Secondary | ICD-10-CM | POA: Diagnosis not present

## 2023-04-30 DIAGNOSIS — E785 Hyperlipidemia, unspecified: Secondary | ICD-10-CM | POA: Diagnosis not present

## 2023-04-30 DIAGNOSIS — F331 Major depressive disorder, recurrent, moderate: Secondary | ICD-10-CM | POA: Diagnosis not present

## 2023-05-02 ENCOUNTER — Other Ambulatory Visit: Payer: Self-pay

## 2023-05-02 ENCOUNTER — Encounter (HOSPITAL_COMMUNITY): Payer: Self-pay | Admitting: Emergency Medicine

## 2023-05-02 ENCOUNTER — Emergency Department (HOSPITAL_COMMUNITY): Payer: Medicare HMO

## 2023-05-02 ENCOUNTER — Inpatient Hospital Stay (HOSPITAL_COMMUNITY)
Admission: EM | Admit: 2023-05-02 | Discharge: 2023-05-06 | DRG: 175 | Disposition: A | Discharge status: Home / Self Care | Payer: Medicare HMO | Attending: Family Medicine | Admitting: Family Medicine

## 2023-05-02 DIAGNOSIS — F17210 Nicotine dependence, cigarettes, uncomplicated: Secondary | ICD-10-CM | POA: Diagnosis not present

## 2023-05-02 DIAGNOSIS — K219 Gastro-esophageal reflux disease without esophagitis: Secondary | ICD-10-CM | POA: Insufficient documentation

## 2023-05-02 DIAGNOSIS — R262 Difficulty in walking, not elsewhere classified: Secondary | ICD-10-CM | POA: Diagnosis not present

## 2023-05-02 DIAGNOSIS — I110 Hypertensive heart disease with heart failure: Secondary | ICD-10-CM | POA: Diagnosis present

## 2023-05-02 DIAGNOSIS — E8809 Other disorders of plasma-protein metabolism, not elsewhere classified: Secondary | ICD-10-CM | POA: Diagnosis present

## 2023-05-02 DIAGNOSIS — Z7982 Long term (current) use of aspirin: Secondary | ICD-10-CM

## 2023-05-02 DIAGNOSIS — E46 Unspecified protein-calorie malnutrition: Secondary | ICD-10-CM | POA: Diagnosis present

## 2023-05-02 DIAGNOSIS — I2699 Other pulmonary embolism without acute cor pulmonale: Secondary | ICD-10-CM

## 2023-05-02 DIAGNOSIS — I5033 Acute on chronic diastolic (congestive) heart failure: Secondary | ICD-10-CM | POA: Diagnosis present

## 2023-05-02 DIAGNOSIS — Z8249 Family history of ischemic heart disease and other diseases of the circulatory system: Secondary | ICD-10-CM

## 2023-05-02 DIAGNOSIS — I5021 Acute systolic (congestive) heart failure: Secondary | ICD-10-CM

## 2023-05-02 DIAGNOSIS — Z833 Family history of diabetes mellitus: Secondary | ICD-10-CM

## 2023-05-02 DIAGNOSIS — Z6829 Body mass index (BMI) 29.0-29.9, adult: Secondary | ICD-10-CM | POA: Diagnosis not present

## 2023-05-02 DIAGNOSIS — E8770 Fluid overload, unspecified: Secondary | ICD-10-CM | POA: Diagnosis not present

## 2023-05-02 DIAGNOSIS — J4489 Other specified chronic obstructive pulmonary disease: Secondary | ICD-10-CM | POA: Diagnosis not present

## 2023-05-02 DIAGNOSIS — F419 Anxiety disorder, unspecified: Secondary | ICD-10-CM | POA: Diagnosis not present

## 2023-05-02 DIAGNOSIS — J4520 Mild intermittent asthma, uncomplicated: Secondary | ICD-10-CM

## 2023-05-02 DIAGNOSIS — E782 Mixed hyperlipidemia: Secondary | ICD-10-CM | POA: Diagnosis not present

## 2023-05-02 DIAGNOSIS — I10 Essential (primary) hypertension: Secondary | ICD-10-CM | POA: Diagnosis not present

## 2023-05-02 DIAGNOSIS — Z79899 Other long term (current) drug therapy: Secondary | ICD-10-CM | POA: Diagnosis not present

## 2023-05-02 DIAGNOSIS — J811 Chronic pulmonary edema: Secondary | ICD-10-CM | POA: Diagnosis not present

## 2023-05-02 DIAGNOSIS — R0600 Dyspnea, unspecified: Secondary | ICD-10-CM

## 2023-05-02 DIAGNOSIS — I3139 Other pericardial effusion (noninflammatory): Secondary | ICD-10-CM | POA: Diagnosis not present

## 2023-05-02 DIAGNOSIS — J45909 Unspecified asthma, uncomplicated: Secondary | ICD-10-CM | POA: Insufficient documentation

## 2023-05-02 DIAGNOSIS — T501X5A Adverse effect of loop [high-ceiling] diuretics, initial encounter: Secondary | ICD-10-CM | POA: Diagnosis not present

## 2023-05-02 DIAGNOSIS — Z83438 Family history of other disorder of lipoprotein metabolism and other lipidemia: Secondary | ICD-10-CM | POA: Diagnosis not present

## 2023-05-02 DIAGNOSIS — Z9181 History of falling: Secondary | ICD-10-CM

## 2023-05-02 DIAGNOSIS — I2694 Multiple subsegmental pulmonary emboli without acute cor pulmonale: Secondary | ICD-10-CM | POA: Diagnosis not present

## 2023-05-02 DIAGNOSIS — I509 Heart failure, unspecified: Secondary | ICD-10-CM | POA: Diagnosis not present

## 2023-05-02 DIAGNOSIS — E876 Hypokalemia: Secondary | ICD-10-CM | POA: Diagnosis not present

## 2023-05-02 DIAGNOSIS — R0989 Other specified symptoms and signs involving the circulatory and respiratory systems: Secondary | ICD-10-CM | POA: Diagnosis not present

## 2023-05-02 DIAGNOSIS — E871 Hypo-osmolality and hyponatremia: Secondary | ICD-10-CM | POA: Diagnosis not present

## 2023-05-02 DIAGNOSIS — Z9049 Acquired absence of other specified parts of digestive tract: Secondary | ICD-10-CM | POA: Diagnosis not present

## 2023-05-02 DIAGNOSIS — J439 Emphysema, unspecified: Secondary | ICD-10-CM | POA: Diagnosis not present

## 2023-05-02 DIAGNOSIS — R600 Localized edema: Secondary | ICD-10-CM | POA: Diagnosis not present

## 2023-05-02 DIAGNOSIS — J90 Pleural effusion, not elsewhere classified: Secondary | ICD-10-CM | POA: Diagnosis not present

## 2023-05-02 DIAGNOSIS — R0602 Shortness of breath: Secondary | ICD-10-CM | POA: Diagnosis not present

## 2023-05-02 LAB — CBC
HCT: 43.5 % (ref 36.0–46.0)
Hemoglobin: 13.5 g/dL (ref 12.0–15.0)
MCH: 29.3 pg (ref 26.0–34.0)
MCHC: 31 g/dL (ref 30.0–36.0)
MCV: 94.4 fL (ref 80.0–100.0)
Platelets: 321 10*3/uL (ref 150–400)
RBC: 4.61 MIL/uL (ref 3.87–5.11)
RDW: 16.7 % — ABNORMAL HIGH (ref 11.5–15.5)
WBC: 8.7 10*3/uL (ref 4.0–10.5)
nRBC: 0 % (ref 0.0–0.2)

## 2023-05-02 LAB — COMPREHENSIVE METABOLIC PANEL
ALT: 31 U/L (ref 0–44)
AST: 29 U/L (ref 15–41)
Albumin: 3 g/dL — ABNORMAL LOW (ref 3.5–5.0)
Alkaline Phosphatase: 116 U/L (ref 38–126)
Anion gap: 11 (ref 5–15)
BUN: 19 mg/dL (ref 8–23)
CO2: 27 mmol/L (ref 22–32)
Calcium: 8.6 mg/dL — ABNORMAL LOW (ref 8.9–10.3)
Chloride: 98 mmol/L (ref 98–111)
Creatinine, Ser: 1.09 mg/dL — ABNORMAL HIGH (ref 0.44–1.00)
GFR, Estimated: 54 mL/min — ABNORMAL LOW (ref >60)
Glucose, Bld: 123 mg/dL — ABNORMAL HIGH (ref 70–99)
Potassium: 3.2 mmol/L — ABNORMAL LOW (ref 3.5–5.1)
Sodium: 136 mmol/L (ref 135–145)
Total Bilirubin: 1.8 mg/dL — ABNORMAL HIGH (ref 0.3–1.2)
Total Protein: 7.8 g/dL (ref 6.5–8.1)

## 2023-05-02 LAB — LIPASE, BLOOD: Lipase: 32 U/L (ref 11–51)

## 2023-05-02 LAB — TROPONIN I (HIGH SENSITIVITY)
Troponin I (High Sensitivity): 11 ng/L (ref <18)
Troponin I (High Sensitivity): 11 ng/L (ref <18)

## 2023-05-02 LAB — BRAIN NATRIURETIC PEPTIDE: B Natriuretic Peptide: 2111 pg/mL — ABNORMAL HIGH (ref 0.0–100.0)

## 2023-05-02 MED ORDER — ORAL CARE MOUTH RINSE: 15.0000 mL | OROMUCOSAL | Status: DC | PRN

## 2023-05-02 MED ORDER — MAGNESIUM OXIDE -MG SUPPLEMENT 400 (240 MG) MG PO TABS
800.0000 mg | ORAL_TABLET | Freq: Once | ORAL | Status: AC
  Administered 2023-05-02: 800 mg via ORAL
  Filled 2023-05-02: qty 2

## 2023-05-02 MED ORDER — POTASSIUM CHLORIDE CRYS ER 20 MEQ PO TBCR
40.0000 meq | EXTENDED_RELEASE_TABLET | Freq: Once | ORAL | Status: AC
  Administered 2023-05-02: 40 meq via ORAL
  Filled 2023-05-02: qty 2

## 2023-05-02 MED ORDER — HEPARIN (PORCINE) 25000 UT/250ML-% IV SOLN
1050.0000 [IU]/h | INTRAVENOUS | Status: DC
  Administered 2023-05-02: 1250 [IU]/h via INTRAVENOUS
  Administered 2023-05-03 – 2023-05-05 (×3): 1150 [IU]/h via INTRAVENOUS
  Filled 2023-05-02 (×4): qty 250

## 2023-05-02 MED ORDER — HEPARIN BOLUS VIA INFUSION
4500.0000 [IU] | Freq: Once | INTRAVENOUS | Status: AC
  Administered 2023-05-02: 4500 [IU] via INTRAVENOUS

## 2023-05-02 MED ORDER — ALBUTEROL SULFATE HFA 108 (90 BASE) MCG/ACT IN AERS: 2.0000 | INHALATION_SPRAY | RESPIRATORY_TRACT | Status: DC | PRN

## 2023-05-02 MED ORDER — IOHEXOL 350 MG/ML SOLN
75.0000 mL | Freq: Once | INTRAVENOUS | Status: AC | PRN
  Administered 2023-05-02: 75 mL via INTRAVENOUS

## 2023-05-02 MED ORDER — ALBUTEROL SULFATE (2.5 MG/3ML) 0.083% IN NEBU: 2.5000 mg | INHALATION_SOLUTION | RESPIRATORY_TRACT | Status: DC | PRN

## 2023-05-02 MED ORDER — FUROSEMIDE 10 MG/ML IJ SOLN
40.0000 mg | Freq: Once | INTRAMUSCULAR | Status: AC
  Administered 2023-05-02: 40 mg via INTRAVENOUS
  Filled 2023-05-02: qty 4

## 2023-05-02 NOTE — H&P (Signed)
History and Physical    Patient: Tamara Santiago ZOX:[id number] DOB: 1951-02-19 DOA: 05/02/2023 DOS: the patient was seen and examined on 05/03/2023 PCP: Benita Stabile, MD  Patient coming from: Home  Chief Complaint:  Chief Complaint  Patient presents with   Shortness of Breath   HPI: Tamara Santiago is a 72 y.o. female with medical history significant of hypertension, hyperlipidemia, GERD, CHF who presents to the emergency department due to 2-week onset of shortness of breath.  She complained of onset of progressive dyspnea which worsens on exertion and with difficulty in being able to lay flat in bed.  Patient has been taking Lasix as an outpatient, but symptoms persist and within the last 2 to 3 days, symptoms have worsened which resulted in her presenting to the ED for further evaluation and management.  ED Course:  In the emergency department, temperature was 97.4 F, respiratory rate 24/min, BP 180/91, other vital signs were within normal range.  Workup in the ED showed normal CBC, BMP except for potassium of 3.2, blood glucose 123, creatinine 1.09, albumin 3.0.  Troponin x 2 was flat at 11, lipase 32, BNP 2111. CT angiography chest with contrast showed small acute segmental and subsegmental pulmonary emboli in the left upper lobe.  Moderate cardiomegaly with mild interstitial pulmonary edema and trace bilateral pleural effusions. Chest x-ray showed mild congestive heart failure. Patient was treated with IV Lasix 40 mg, potassium was replenished, magnesium was given, patient was started on heparin drip due to PE. Hospitalist was asked to admit patient for further evaluation and management.  Review of Systems: Review of systems as noted in the HPI. All other systems reviewed and are negative.   Past Medical History:  Diagnosis Date   Anxiety    Essential hypertension    GERD (gastroesophageal reflux disease)    Hyperlipidemia    Past Surgical History:  Procedure Laterality Date    CHOLECYSTECTOMY N/A 09/24/2018   Procedure: LAPAROSCOPIC CHOLECYSTECTOMY;  Surgeon: Franky Macho, MD;  Location: [facility];  Service: General;  Laterality: N/A;   TUBAL LIGATION      Social History:  reports that she has been smoking cigarettes. She has been smoking an average of .25 packs per day. She has never used smokeless tobacco. She reports that she does not drink alcohol and does not use drugs.   Allergies  Allergen Reactions   Ambien [Zolpidem]     Family History  Problem Relation Age of Onset   Diabetes Mellitus II Mother    Heart disease Mother    Hyperlipidemia Mother      Prior to Admission medications   Medication Sig Start Date End Date Taking? Authorizing Provider  furosemide (LASIX) 20 MG tablet Take 1 tablet (20 mg total) by mouth daily as needed. Take for weight gain over 2-3 lbs in 24 hours 04/18/23 07/17/23  Charlsie Quest, NP  albuterol (VENTOLIN HFA) 108 (90 Base) MCG/ACT inhaler  12/07/20   [provider]  aspirin EC 81 MG tablet Take 81 mg by mouth daily.    [provider]  escitalopram (LEXAPRO) 10 MG tablet Take 10 mg by mouth daily.    [provider]  imipramine (TOFRANIL) 25 MG tablet Take 25 mg by mouth at bedtime.    [provider]  loratadine (CLARITIN) 10 MG tablet Take 10 mg by mouth daily.    [provider]  metoprolol tartrate (LOPRESSOR) 50 MG tablet Take 50 mg by mouth daily.    [provider]  omeprazole (PRILOSEC) 40 MG capsule Take 40 mg by mouth daily.    [provider]  potassium chloride (MICRO-K) 10 MEQ CR capsule Take 10 mEq by mouth 2 (two) times daily.    [provider]  rosuvastatin (CRESTOR) 10 MG tablet Take 10 mg by mouth daily.    [provider]  Vitamin D, Cholecalciferol, 1000 units TABS Take 1 tablet by mouth daily.    [provider]    Physical Exam: BP (!) 154/75 (BP Location: Left Arm)   Pulse 92   Temp 98 F (36.7 C) (Oral)    Resp (!) 22   Ht 5\' 4"  (1.626 m)   Wt 77.8 kg   SpO2 100%   BMI 29.46 kg/m   General: 72 y.o. year-old female well developed well nourished in no acute distress.  Alert and oriented x3. HEENT: NCAT, EOMI Neck: Supple, trachea medial Cardiovascular: Regular rate and rhythm with no rubs or gallops.  No thyromegaly or JVD noted.  No lower extremity edema. 2/4 pulses in all 4 extremities. Respiratory: Clear to auscultation with no wheezes or rales. Good inspiratory effort. Abdomen: Soft, nontender nondistended with normal bowel sounds x4 quadrants. Muskuloskeletal: No cyanosis, clubbing or edema noted bilaterally Neuro: CN II-XII intact, strength 5/5 x 4, sensation, reflexes intact Skin: No ulcerative lesions noted or rashes Psychiatry: Mood is appropriate for condition and setting          Labs on Admission:  Basic Metabolic Panel: Recent Labs  Lab 05/02/23 1507  NA 136  K 3.2*  CL 98  CO2 27  GLUCOSE 123*  BUN 19  CREATININE 1.09*  CALCIUM 8.6*   Liver Function Tests: Recent Labs  Lab 05/02/23 1507  AST 29  ALT 31  ALKPHOS 116  BILITOT 1.8*  PROT 7.8  ALBUMIN 3.0*   Recent Labs  Lab 05/02/23 1507  LIPASE 32   No results for input(s): "AMMONIA" in the last 168 hours. CBC: Recent Labs  Lab 05/02/23 1507  WBC 8.7  HGB 13.5  HCT 43.5  MCV 94.4  PLT 321   Cardiac Enzymes: No results for input(s): "CKTOTAL", "CKMB", "CKMBINDEX", "TROPONINI" in the last 168 hours.  BNP (last 3 results) Recent Labs    05/02/23 1739  BNP 2,111.0*    ProBNP (last 3 results) No results for input(s): "PROBNP" in the last 8760 hours.  CBG: No results for input(s): "GLUCAP" in the last 168 hours.  Radiological Exams on Admission: CT Angio Chest PE W and/or Wo Contrast  Addendum Date: 05/02/2023   ADDENDUM REPORT: 05/02/2023 20:23 ADDENDUM: Critical Value/emergent results were called by telephone at the time of interpretation on 05/02/2023 at 8:20 pm to provider MADISON  [facility] , who verbally acknowledged these results. Electronically Signed   By: Obie Dredge M.D.   On: 05/02/2023 20:23   Result Date: 05/02/2023 CLINICAL DATA:  Shortness of breath for the past 2 weeks. EXAM: CT ANGIOGRAPHY CHEST WITH CONTRAST TECHNIQUE: Multidetector CT imaging of the chest was performed using the standard protocol during bolus administration of intravenous contrast. Multiplanar CT image reconstructions and MIPs were obtained to evaluate the vascular anatomy. RADIATION DOSE REDUCTION: This exam was performed according to the departmental dose-optimization program which includes automated exposure control, adjustment of the mA and/or kV according to patient size and/or use of iterative reconstruction technique. CONTRAST:  75mL OMNIPAQUE IOHEXOL 350 MG/ML SOLN COMPARISON:  Chest x-ray from same day. FINDINGS: Cardiovascular: Satisfactory opacification of the pulmonary arteries to the segmental level.  Small acute segmental and subsegmental pulmonary emboli in the left upper lobe (series 8, image 33). Moderate cardiomegaly. Reflux of contrast into the IVC and hepatic veins. No pericardial effusion. No thoracic aortic aneurysm. Coronary, aortic arch, and branch vessel atherosclerotic vascular disease. Mediastinum/Nodes: Prominent mediastinal and right hilar lymph nodes measuring up to 1 cm in short axis, likely reactive. No enlarged axillary lymph nodes. Thyroid gland, trachea, and esophagus demonstrate no significant findings. Lungs/Pleura: Trace bilateral pleural effusions, greater on the right. Scattered mild smooth interlobular septal thickening. No consolidation or pneumothorax. Mild paraseptal emphysema. Upper Abdomen: No acute abnormality.  Multiple hepatic simple cysts. Musculoskeletal: No chest wall abnormality. No acute or significant osseous findings. Scoliosis. Chronic T12 compression deformity. Review of the MIP images confirms the above findings. IMPRESSION: 1. Small acute segmental  and subsegmental pulmonary emboli in the left upper lobe. 2. Moderate cardiomegaly with mild interstitial pulmonary edema and trace bilateral pleural effusions. 3. Aortic Atherosclerosis (ICD10-I70.0) and Emphysema (ICD10-J43.9). Electronically Signed: By: Obie Dredge M.D. On: 05/02/2023 20:13   DG Chest 2 View  Result Date: 05/02/2023 CLINICAL DATA:  Short of breath for 2 weeks, fluid overload, tachypnea EXAM: CHEST - 2 VIEW COMPARISON:  03/26/2023 FINDINGS: Frontal and lateral views of the chest demonstrates stable enlargement of the cardiac silhouette. There is stable central vascular congestion and interstitial prominence compatible with mild congestive heart failure. No effusion or pneumothorax. No acute bony abnormalities. Stable left convex scoliosis centered at the thoracolumbar junction. IMPRESSION: 1. Mild congestive heart failure. No significant change in volume status. Electronically Signed   By: Sharlet Salina M.D.   On: 05/02/2023 14:59    EKG: I independently viewed the EKG done and my findings are as followed: EKG was not done in the ED  Assessment/Plan Present on Admission:  Hypokalemia  Principal Problem:   CHF (congestive heart failure) (HCC) Active Problems:   Hypokalemia   Acute pulmonary embolism (HCC)   Hypoalbuminemia due to protein-calorie malnutrition (HCC)   Essential hypertension   Mixed hyperlipidemia   GERD (gastroesophageal reflux disease)   Asthma, chronic  Congestive heart failure  CT angiography showed moderate cardiomegaly with mild interstitial pulmonary edema and trace bilateral pleural effusions. BNP 2,111 Continue total input/output, daily weights and fluid restriction Continue IV Lasix 40 twice daily Continue heart healthy diet  EKG and Echocardiogram in the morning  Pulmonary embolism CT angiography chest with contrast showed small acute segmental and subsegmental pulmonary emboli in the left upper lobe.  Patient was started on IV heparin  drip with plan to transition to DOAC in the morning Bilateral lower extremity ultrasound will be done in the morning Echocardiogram will be done in the morning  Hypokalemia K+ 3.2, this was replenished  Hypoalbuminemia possibly secondary to moderate protein calorie malnutrition Albumin 3.0, protein supplement will be provided  Essential hypertension Continue IV Lasix  Mixed hyperlipidemia Continue Crestor  GERD Continue Protonix  Asthma Continue albuterol  DVT prophylaxis: Heparin drip  Advance Care Planning: Full code  Consults: None  Family Communication: None at bedside  Severity of Illness: The appropriate patient status for this patient is INPATIENT. Inpatient status is judged to be reasonable and necessary in order to provide the required intensity of service to ensure the patient's safety. The patient's presenting symptoms, physical exam findings, and initial radiographic and laboratory data in the context of their chronic comorbidities is felt to place them at high risk for further clinical deterioration. Furthermore, it is not anticipated that the patient will be  medically stable for discharge from the hospital within 2 midnights of admission.   * I certify that at the point of admission it is my clinical judgment that the patient will require inpatient hospital care spanning beyond 2 midnights from the point of admission due to high intensity of service, high risk for further deterioration and high frequency of surveillance required.*  Author: Frankey Shown, DO 05/03/2023 2:50 AM  For on call review [email or web address].

## 2023-05-02 NOTE — Progress Notes (Signed)
ANTICOAGULATION CONSULT NOTE - Initial Consult  Pharmacy Consult for Heparin Indication: pulmonary embolus  Allergies  Allergen Reactions   Ambien [Zolpidem]     Patient Measurements: Height: 5\' 4"  (162.6 cm) Weight: 79.4 kg (175 lb) IBW/kg (Calculated) : 54.7 Heparin Dosing Weight: 71.7 kg  Vital Signs: Temp: 97.4 F (36.3 C) (06/27 1856) Temp Source: Oral (06/27 1856) BP: 180/91 (06/27 1856) Pulse Rate: 93 (06/27 1856)  Labs: Recent Labs    05/02/23 1507 05/02/23 1739  HGB 13.5  --   HCT 43.5  --   PLT 321  --   CREATININE 1.09*  --   TROPONINIHS 11 11    Estimated Creatinine Clearance: 46.9 mL/min (A) (by C-G formula based on SCr of 1.09 mg/dL (H)).   Medical History: Past Medical History:  Diagnosis Date   Anxiety    Essential hypertension    GERD (gastroesophageal reflux disease)    Hyperlipidemia     Medications:  (Not in a hospital admission)   Assessment: 72 yo female with h/o HTN, GERD and HLD presents with 2 week h/o SOA. CT angio positive for pulmonary embolism. CBC stable, PLT stable. No PTA anticoagulation noted.   Goal of Therapy:  Heparin level 0.3-0.7 units/ml Monitor platelets by anticoagulation protocol: Yes   Plan:  Heparin 4500 units IV x 1 bolus Heparin drip at 1250 units/hr Heparin levvel 8 hours from start and daily  CBC daily Monitor for bleeding  Myelle Poteat A. Jeanella Craze, PharmD, BCPS, FNKF Clinical Pharmacist [place] Please utilize Amion for appropriate phone number to reach the unit pharmacist [facility])  05/02/2023,8:32 PM

## 2023-05-02 NOTE — ED Notes (Signed)
Please call daughter Cala Bradford when pt moves to floor. Number in chart

## 2023-05-02 NOTE — ED Triage Notes (Signed)
Pt c/o SOB x 2 weeks with recent dx "fluid around heart and lungs." Pt received rx for lasix 40mg  and potassium and has been taking meds as prescribed with no improvement in symptoms. Pt is tachypneic in triage. She also notes lower abdominal pain since Saturday. Denies n/v/d/constipation. Pain 10/10 intermittent. Lungs clear bilaterally.

## 2023-05-02 NOTE — ED Provider Notes (Signed)
[place] EMERGENCY DEPARTMENT AT [facility] Provider Note  CSN: [id number] Arrival date & time: 05/02/23 1353  Chief Complaint(s) Shortness of Breath  HPI Tamara Santiago is a 72 y.o. female with PMH HTN, GERD, HLD, CHF with EF 55 to 60%, small to moderate pericardial effusion who presents emergency room for evaluation of shortness of breath.  Patient states that over the last 2 weeks she has had progressive worsening dyspnea on exertion and has been using outpatient oral Lasix but symptoms are still persisting.  States over the last 2 to 3 days her symptoms have significantly worsened and presents to the emergency department for further evaluation.  Triage note says that the patient had been endorsing abdominal pain but on my examination and evaluation she is not endorsing any abdominal pain today.  Here in the emergency room she endorses shortness of breath but denies chest pain, abdominal pain, nausea, vomiting or other systemic symptoms.   Past Medical History Past Medical History:  Diagnosis Date   Anxiety    Essential hypertension    GERD (gastroesophageal reflux disease)    Hyperlipidemia    Patient Active Problem List   Diagnosis Date Noted   Acute cholecystitis 09/23/2018   Hypokalemia 09/23/2018   Cholangitis 09/23/2018   Sepsis (HCC) 09/23/2018   Home Medication(s) Prior to Admission medications   Medication Sig Start Date End Date Taking? Authorizing Provider  furosemide (LASIX) 20 MG tablet Take 1 tablet (20 mg total) by mouth daily as needed. Take for weight gain over 2-3 lbs in 24 hours 04/18/23 07/17/23  Charlsie Quest, NP  albuterol (VENTOLIN HFA) 108 (90 Base) MCG/ACT inhaler  12/07/20   [provider]  aspirin EC 81 MG tablet Take 81 mg by mouth daily.    [provider]  escitalopram (LEXAPRO) 10 MG tablet Take 10 mg by mouth daily.    [provider]  imipramine (TOFRANIL) 25 MG tablet Take 25 mg by mouth at bedtime.     [provider]  loratadine (CLARITIN) 10 MG tablet Take 10 mg by mouth daily.    [provider]  metoprolol tartrate (LOPRESSOR) 50 MG tablet Take 50 mg by mouth daily.    [provider]  omeprazole (PRILOSEC) 40 MG capsule Take 40 mg by mouth daily.    [provider]  potassium chloride (MICRO-K) 10 MEQ CR capsule Take 10 mEq by mouth 2 (two) times daily.    [provider]  rosuvastatin (CRESTOR) 10 MG tablet Take 10 mg by mouth daily.    [provider]  Vitamin D, Cholecalciferol, 1000 units TABS Take 1 tablet by mouth daily.    [provider]                                                                                                                                    Past Surgical History Past Surgical History:  Procedure Laterality  Date   CHOLECYSTECTOMY N/A 09/24/2018   Procedure: LAPAROSCOPIC CHOLECYSTECTOMY;  Surgeon: Franky Macho, MD;  Location: [facility];  Service: General;  Laterality: N/A;   TUBAL LIGATION     Family History Family History  Problem Relation Age of Onset   Diabetes Mellitus II Mother    Heart disease Mother    Hyperlipidemia Mother     Social History Social History   Tobacco Use   Smoking status: Every Day    Packs/day: .25    Types: Cigarettes   Smokeless tobacco: Never  Vaping Use   Vaping Use: Never used  Substance Use Topics   Alcohol use: No   Drug use: No   Allergies Ambien [zolpidem]  Review of Systems Review of Systems  Respiratory:  Positive for shortness of breath.     Physical Exam Vital Signs  I have reviewed the triage vital signs BP (!) 180/91 (BP Location: Right Arm)   Pulse 93   Temp (!) 97.4 F (36.3 C) (Oral)   Resp (!) 24   Ht 5\' 4"  (1.626 m)   Wt 79.4 kg   SpO2 95% Comment: varies between high 80's to 90's  BMI 30.04 kg/m   Physical Exam Vitals and nursing note reviewed.  Constitutional:      General: She is not in acute distress.     Appearance: She is well-developed.  HENT:     Head: Normocephalic and atraumatic.  Eyes:     Conjunctiva/sclera: Conjunctivae normal.  Cardiovascular:     Rate and Rhythm: Normal rate and regular rhythm.     Heart sounds: No murmur heard. Pulmonary:     Effort: Pulmonary effort is normal. No respiratory distress.     Breath sounds: Normal breath sounds.  Abdominal:     Palpations: Abdomen is soft.     Tenderness: There is no abdominal tenderness.  Musculoskeletal:        General: No swelling.     Cervical back: Neck supple.  Skin:    General: Skin is warm and dry.     Capillary Refill: Capillary refill takes less than 2 seconds.  Neurological:     Mental Status: She is alert.  Psychiatric:        Mood and Affect: Mood normal.     ED Results and Treatments Labs (all labs ordered are listed, but only abnormal results are displayed) Labs Reviewed  CBC - Abnormal; Notable for the following components:      Result Value   RDW 16.7 (*)    All other components within normal limits  COMPREHENSIVE METABOLIC PANEL - Abnormal; Notable for the following components:   Potassium 3.2 (*)    Glucose, Bld 123 (*)    Creatinine, Ser 1.09 (*)    Calcium 8.6 (*)    Albumin 3.0 (*)    Total Bilirubin 1.8 (*)    GFR, Estimated 54 (*)    All other components within normal limits  BRAIN NATRIURETIC PEPTIDE - Abnormal; Notable for the following components:   B Natriuretic Peptide 2,111.0 (*)    All other components within normal limits  LIPASE, BLOOD  TROPONIN I (HIGH SENSITIVITY)  TROPONIN I (HIGH SENSITIVITY)  Radiology CT Angio Chest PE W and/or Wo Contrast  Result Date: 05/02/2023 CLINICAL DATA:  Shortness of breath for the past 2 weeks. EXAM: CT ANGIOGRAPHY CHEST WITH CONTRAST TECHNIQUE: Multidetector CT imaging of the chest was performed using the standard  protocol during bolus administration of intravenous contrast. Multiplanar CT image reconstructions and MIPs were obtained to evaluate the vascular anatomy. RADIATION DOSE REDUCTION: This exam was performed according to the departmental dose-optimization program which includes automated exposure control, adjustment of the mA and/or kV according to patient size and/or use of iterative reconstruction technique. CONTRAST:  75mL OMNIPAQUE IOHEXOL 350 MG/ML SOLN COMPARISON:  Chest x-ray from same day. FINDINGS: Cardiovascular: Satisfactory opacification of the pulmonary arteries to the segmental level. Small acute segmental and subsegmental pulmonary emboli in the left upper lobe (series 8, image 33). Moderate cardiomegaly. Reflux of contrast into the IVC and hepatic veins. No pericardial effusion. No thoracic aortic aneurysm. Coronary, aortic arch, and branch vessel atherosclerotic vascular disease. Mediastinum/Nodes: Prominent mediastinal and right hilar lymph nodes measuring up to 1 cm in short axis, likely reactive. No enlarged axillary lymph nodes. Thyroid gland, trachea, and esophagus demonstrate no significant findings. Lungs/Pleura: Trace bilateral pleural effusions, greater on the right. Scattered mild smooth interlobular septal thickening. No consolidation or pneumothorax. Mild paraseptal emphysema. Upper Abdomen: No acute abnormality.  Multiple hepatic simple cysts. Musculoskeletal: No chest wall abnormality. No acute or significant osseous findings. Scoliosis. Chronic T12 compression deformity. Review of the MIP images confirms the above findings. IMPRESSION: 1. Small acute segmental and subsegmental pulmonary emboli in the left upper lobe. 2. Moderate cardiomegaly with mild interstitial pulmonary edema and trace bilateral pleural effusions. 3. Aortic Atherosclerosis (ICD10-I70.0) and Emphysema (ICD10-J43.9). Electronically Signed   By: Obie Dredge M.D.   On: 05/02/2023 20:13   DG Chest 2 View  Result  Date: 05/02/2023 CLINICAL DATA:  Short of breath for 2 weeks, fluid overload, tachypnea EXAM: CHEST - 2 VIEW COMPARISON:  03/26/2023 FINDINGS: Frontal and lateral views of the chest demonstrates stable enlargement of the cardiac silhouette. There is stable central vascular congestion and interstitial prominence compatible with mild congestive heart failure. No effusion or pneumothorax. No acute bony abnormalities. Stable left convex scoliosis centered at the thoracolumbar junction. IMPRESSION: 1. Mild congestive heart failure. No significant change in volume status. Electronically Signed   By: Sharlet Salina M.D.   On: 05/02/2023 14:59    Pertinent labs & imaging results that were available during my care of the patient were reviewed by me and considered in my medical decision making (see MDM for details).  Medications Ordered in ED Medications  albuterol (PROVENTIL) (2.5 MG/3ML) 0.083% nebulizer solution 2.5 mg (has no administration in time range)  iohexol (OMNIPAQUE) 350 MG/ML injection 75 mL (75 mLs Intravenous Contrast Given 05/02/23 1923)  furosemide (LASIX) injection 40 mg (40 mg Intravenous Given 05/02/23 1917)  potassium chloride SA (KLOR-CON M) CR tablet 40 mEq (40 mEq Oral Given 05/02/23 1918)  magnesium oxide (MAG-OX) tablet 800 mg (800 mg Oral Given 05/02/23 1918)  Procedures .Critical Care  Performed by: Glendora Score, MD Authorized by: Glendora Score, MD   Critical care provider statement:    Critical care time (minutes):  30   Critical care was necessary to treat or prevent imminent or life-threatening deterioration of the following conditions:  Respiratory failure (Pulmonary emboli requiring heparin)   Critical care was time spent personally by me on the following activities:  Development of treatment plan with patient or surrogate, discussions with  consultants, evaluation of patient's response to treatment, examination of patient, ordering and review of laboratory studies, ordering and review of radiographic studies, ordering and performing treatments and interventions, pulse oximetry, re-evaluation of patient's condition and review of old charts   (including critical care time)  Medical Decision Making / ED Course   This patient presents to the ED for concern of shortness of breath, this involves an extensive number of treatment options, and is a complaint that carries with it a high risk of complications and morbidity.  The differential diagnosis includes Pe, PTX, Pulmonary Edema, ARDS, COPD/Asthma, ACS, CHF exacerbation, Arrhythmia, Pericardial Effusion/Tamponade, Anemia, Sepsis, Acidosis/Hypercapnia, Anxiety, Viral URI  MDM: Patient seen emergency room for evaluation of dyspnea on exertion.  Physical exam is largely unremarkable with no appreciable lower extremity edema.  Laboratory evaluation with mild hypokalemia to 3.2 which was repleted in the emergency department, hypoalbuminemia at 3.0.  High-sensitivity troponin is normal at 11, BNP is significantly elevated to 2111.  Chest x-ray with evidence of mild CHF but given patient's significant symptoms, CT PE obtained that does show small acute segmental and subsegmental pulmonary emboli in the left upper lobe, moderate cardiomegaly and pulmonary edema.  Diuresis begun and patient started on heparin drip for the pulmonary emboli.  Patient will require hospital admission for dyspnea on exertion, new pulmonary emboli and CHF exacerbation with failure of outpatient Lasix.   Additional history obtained: -Additional history obtained from multiple family members -External records from outside source obtained and reviewed including: Chart review including previous notes, labs, imaging, consultation notes   Lab Tests: -I ordered, reviewed, and interpreted labs.   The pertinent results include:    Labs Reviewed  CBC - Abnormal; Notable for the following components:      Result Value   RDW 16.7 (*)    All other components within normal limits  COMPREHENSIVE METABOLIC PANEL - Abnormal; Notable for the following components:   Potassium 3.2 (*)    Glucose, Bld 123 (*)    Creatinine, Ser 1.09 (*)    Calcium 8.6 (*)    Albumin 3.0 (*)    Total Bilirubin 1.8 (*)    GFR, Estimated 54 (*)    All other components within normal limits  BRAIN NATRIURETIC PEPTIDE - Abnormal; Notable for the following components:   B Natriuretic Peptide 2,111.0 (*)    All other components within normal limits  LIPASE, BLOOD  TROPONIN I (HIGH SENSITIVITY)  TROPONIN I (HIGH SENSITIVITY)      Imaging Studies ordered: I ordered imaging studies including chest x-ray, CT PE I independently visualized and interpreted imaging. I agree with the radiologist interpretation   Medicines ordered and prescription drug management: Meds ordered this encounter  Medications   DISCONTD: albuterol (VENTOLIN HFA) 108 (90 Base) MCG/ACT inhaler 2 puff   albuterol (PROVENTIL) (2.5 MG/3ML) 0.083% nebulizer solution 2.5 mg   iohexol (OMNIPAQUE) 350 MG/ML injection 75 mL   furosemide (LASIX) injection 40 mg   potassium chloride SA (KLOR-CON M) CR tablet 40 mEq   magnesium  oxide (MAG-OX) tablet 800 mg    -I have reviewed the patients home medicines and have made adjustments as needed  Critical interventions Heparin, diuresis    Cardiac Monitoring: The patient was maintained on a cardiac monitor.  I personally viewed and interpreted the cardiac monitored which showed an underlying rhythm of: NSR  Social Determinants of Health:  Factors impacting patients care include: none   Reevaluation: After the interventions noted above, I reevaluated the patient and found that they have :improved  Co morbidities that complicate the patient evaluation  Past Medical History:  Diagnosis Date   Anxiety    Essential  hypertension    GERD (gastroesophageal reflux disease)    Hyperlipidemia       Dispostion: I considered admission for this patient, and due to CHF exacerbation with new pulmonary emboli patient require hospital admission     Final Clinical Impression(s) / ED Diagnoses Final diagnoses:  Dyspnea, unspecified type     @PCDICTATION @    Glendora Score, MD 05/03/23 0147

## 2023-05-03 ENCOUNTER — Inpatient Hospital Stay (HOSPITAL_COMMUNITY): Payer: Medicare HMO

## 2023-05-03 DIAGNOSIS — J45909 Unspecified asthma, uncomplicated: Secondary | ICD-10-CM | POA: Insufficient documentation

## 2023-05-03 DIAGNOSIS — I10 Essential (primary) hypertension: Secondary | ICD-10-CM | POA: Insufficient documentation

## 2023-05-03 DIAGNOSIS — I2699 Other pulmonary embolism without acute cor pulmonale: Secondary | ICD-10-CM | POA: Insufficient documentation

## 2023-05-03 DIAGNOSIS — I5033 Acute on chronic diastolic (congestive) heart failure: Secondary | ICD-10-CM

## 2023-05-03 DIAGNOSIS — E782 Mixed hyperlipidemia: Secondary | ICD-10-CM | POA: Insufficient documentation

## 2023-05-03 DIAGNOSIS — I5021 Acute systolic (congestive) heart failure: Secondary | ICD-10-CM

## 2023-05-03 DIAGNOSIS — E46 Unspecified protein-calorie malnutrition: Secondary | ICD-10-CM | POA: Insufficient documentation

## 2023-05-03 DIAGNOSIS — K219 Gastro-esophageal reflux disease without esophagitis: Secondary | ICD-10-CM | POA: Insufficient documentation

## 2023-05-03 DIAGNOSIS — E8809 Other disorders of plasma-protein metabolism, not elsewhere classified: Secondary | ICD-10-CM | POA: Insufficient documentation

## 2023-05-03 DIAGNOSIS — E876 Hypokalemia: Secondary | ICD-10-CM | POA: Diagnosis not present

## 2023-05-03 LAB — HEPARIN LEVEL (UNFRACTIONATED)
Heparin Unfractionated: 0.83 IU/mL — ABNORMAL HIGH (ref 0.30–0.70)
Heparin Unfractionated: 0.4 IU/mL (ref 0.30–0.70)

## 2023-05-03 LAB — PHOSPHORUS: Phosphorus: 2.7 mg/dL (ref 2.5–4.6)

## 2023-05-03 LAB — CBC
HCT: 43.1 % (ref 36.0–46.0)
Hemoglobin: 13.7 g/dL (ref 12.0–15.0)
MCH: 29.5 pg (ref 26.0–34.0)
MCHC: 31.8 g/dL (ref 30.0–36.0)
MCV: 92.7 fL (ref 80.0–100.0)
Platelets: 324 10*3/uL (ref 150–400)
RBC: 4.65 MIL/uL (ref 3.87–5.11)
RDW: 16.7 % — ABNORMAL HIGH (ref 11.5–15.5)
WBC: 9.1 10*3/uL (ref 4.0–10.5)
nRBC: 0 % (ref 0.0–0.2)

## 2023-05-03 LAB — ECHOCARDIOGRAM COMPLETE
AR max vel: 1.7 cm2
AV Area VTI: 1.59 cm2
AV Area mean vel: 1.68 cm2
AV Mean grad: 5 mmHg
AV Peak grad: 9.9 mmHg
AV Vena cont: 0.5 cm
Ao pk vel: 1.57 m/s
Area-P 1/2: 5.84 cm2
Est EF: 55
Height: 64 in
S' Lateral: 3.6 cm
Weight: 2694.9 oz

## 2023-05-03 LAB — COMPREHENSIVE METABOLIC PANEL
ALT: 29 U/L (ref 0–44)
AST: 28 U/L (ref 15–41)
Albumin: 3.2 g/dL — ABNORMAL LOW (ref 3.5–5.0)
Alkaline Phosphatase: 114 U/L (ref 38–126)
Anion gap: 13 (ref 5–15)
BUN: 16 mg/dL (ref 8–23)
CO2: 27 mmol/L (ref 22–32)
Calcium: 8.5 mg/dL — ABNORMAL LOW (ref 8.9–10.3)
Chloride: 98 mmol/L (ref 98–111)
Creatinine, Ser: 0.98 mg/dL (ref 0.44–1.00)
GFR, Estimated: >60 mL/min (ref >60)
Glucose, Bld: 125 mg/dL — ABNORMAL HIGH (ref 70–99)
Potassium: 3.1 mmol/L — ABNORMAL LOW (ref 3.5–5.1)
Sodium: 138 mmol/L (ref 135–145)
Total Bilirubin: 1.7 mg/dL — ABNORMAL HIGH (ref 0.3–1.2)
Total Protein: 8 g/dL (ref 6.5–8.1)

## 2023-05-03 LAB — MAGNESIUM: Magnesium: 1.6 mg/dL — ABNORMAL LOW (ref 1.7–2.4)

## 2023-05-03 MED ORDER — ACETAMINOPHEN 650 MG RE SUPP: 650.0000 mg | Freq: Four times a day (QID) | RECTAL | Status: DC | PRN

## 2023-05-03 MED ORDER — ALBUTEROL SULFATE (2.5 MG/3ML) 0.083% IN NEBU: 2.5000 mg | INHALATION_SOLUTION | RESPIRATORY_TRACT | Status: DC | PRN

## 2023-05-03 MED ORDER — METHOCARBAMOL 500 MG PO TABS
750.0000 mg | ORAL_TABLET | Freq: Once | ORAL | Status: AC
  Administered 2023-05-03: 750 mg via ORAL
  Filled 2023-05-03: qty 2

## 2023-05-03 MED ORDER — MAGNESIUM SULFATE 2 GM/50ML IV SOLN
2.0000 g | Freq: Once | INTRAVENOUS | Status: AC
  Administered 2023-05-03: 2 g via INTRAVENOUS
  Filled 2023-05-03: qty 50

## 2023-05-03 MED ORDER — FUROSEMIDE 10 MG/ML IJ SOLN: 40.0000 mg | Freq: Two times a day (BID) | INTRAMUSCULAR | Status: DC

## 2023-05-03 MED ORDER — HYDRALAZINE HCL 20 MG/ML IJ SOLN
10.0000 mg | Freq: Four times a day (QID) | INTRAMUSCULAR | Status: DC | PRN
  Administered 2023-05-04: 10 mg via INTRAVENOUS
  Filled 2023-05-03: qty 1

## 2023-05-03 MED ORDER — LOSARTAN POTASSIUM 25 MG PO TABS
25.0000 mg | ORAL_TABLET | Freq: Every day | ORAL | Status: DC
  Administered 2023-05-04 – 2023-05-06 (×3): 25 mg via ORAL
  Filled 2023-05-03 (×3): qty 1

## 2023-05-03 MED ORDER — ONDANSETRON HCL 4 MG/2ML IJ SOLN: 4.0000 mg | Freq: Four times a day (QID) | INTRAMUSCULAR | Status: DC | PRN

## 2023-05-03 MED ORDER — ACETAMINOPHEN 325 MG PO TABS
650.0000 mg | ORAL_TABLET | Freq: Four times a day (QID) | ORAL | Status: DC | PRN
  Administered 2023-05-04: 650 mg via ORAL
  Filled 2023-05-03: qty 2

## 2023-05-03 MED ORDER — PANTOPRAZOLE SODIUM 40 MG PO TBEC
40.0000 mg | DELAYED_RELEASE_TABLET | Freq: Every day | ORAL | Status: DC
  Administered 2023-05-03 – 2023-05-06 (×4): 40 mg via ORAL
  Filled 2023-05-03 (×5): qty 1

## 2023-05-03 MED ORDER — ROSUVASTATIN CALCIUM 10 MG PO TABS
10.0000 mg | ORAL_TABLET | Freq: Every day | ORAL | Status: DC
  Administered 2023-05-03 – 2023-05-06 (×4): 10 mg via ORAL
  Filled 2023-05-03 (×5): qty 1

## 2023-05-03 MED ORDER — ENSURE ENLIVE PO LIQD
237.0000 mL | Freq: Two times a day (BID) | ORAL | Status: DC
  Administered 2023-05-03 (×2): 237 mL via ORAL

## 2023-05-03 MED ORDER — POTASSIUM CHLORIDE CRYS ER 20 MEQ PO TBCR
40.0000 meq | EXTENDED_RELEASE_TABLET | ORAL | Status: AC
  Administered 2023-05-03 (×2): 40 meq via ORAL
  Filled 2023-05-03 (×2): qty 2

## 2023-05-03 MED ORDER — ONDANSETRON HCL 4 MG PO TABS: 4.0000 mg | ORAL_TABLET | Freq: Four times a day (QID) | ORAL | Status: DC | PRN

## 2023-05-03 MED ORDER — METOPROLOL TARTRATE 50 MG PO TABS
50.0000 mg | ORAL_TABLET | Freq: Two times a day (BID) | ORAL | Status: DC
  Administered 2023-05-03 – 2023-05-06 (×6): 50 mg via ORAL
  Filled 2023-05-03 (×6): qty 1

## 2023-05-03 MED ORDER — FUROSEMIDE 10 MG/ML IJ SOLN
40.0000 mg | Freq: Two times a day (BID) | INTRAMUSCULAR | Status: DC
  Administered 2023-05-03 – 2023-05-05 (×5): 40 mg via INTRAVENOUS
  Filled 2023-05-03 (×5): qty 4

## 2023-05-03 MED ORDER — PERFLUTREN LIPID MICROSPHERE
1.0000 mL | INTRAVENOUS | Status: AC | PRN
  Administered 2023-05-03: 4 mL via INTRAVENOUS

## 2023-05-03 MED ORDER — NICOTINE 21 MG/24HR TD PT24
21.0000 mg | MEDICATED_PATCH | Freq: Every day | TRANSDERMAL | Status: DC
  Administered 2023-05-03 – 2023-05-06 (×4): 21 mg via TRANSDERMAL
  Filled 2023-05-03 (×4): qty 1

## 2023-05-03 NOTE — Progress Notes (Signed)
ANTICOAGULATION CONSULT NOTE - Initial Consult  Pharmacy Consult for Heparin Indication: pulmonary embolus  Allergies  Allergen Reactions   Ambien [Zolpidem]     Patient Measurements: Height: 5\' 4"  (162.6 cm) Weight: 76.4 kg (168 lb 6.9 oz) IBW/kg (Calculated) : 54.7 Heparin Dosing Weight: 71.7 kg  Vital Signs: Temp: 97.9 F (36.6 C) (06/28 2027) BP: 143/89 (06/28 2027) Pulse Rate: 100 (06/28 2027)  Labs: Recent Labs    05/02/23 1507 05/02/23 1739 05/03/23 0446 05/03/23 2159  HGB 13.5  --  13.7  --   HCT 43.5  --  43.1  --   PLT 321  --  324  --   HEPARINUNFRC  --   --  0.83* 0.40  CREATININE 1.09*  --  0.98  --   TROPONINIHS 11 11  --   --      Estimated Creatinine Clearance: 51.2 mL/min (by C-G formula based on SCr of 0.98 mg/dL).   Medical History: Past Medical History:  Diagnosis Date   Anxiety    Essential hypertension    GERD (gastroesophageal reflux disease)    Hyperlipidemia     Medications:  Medications Prior to Admission  Medication Sig Dispense Refill Last Dose   acetaminophen (TYLENOL) 500 MG tablet Take 500-1,000 mg by mouth every 6 (six) hours as needed for moderate pain.   05/02/2023   albuterol (VENTOLIN HFA) 108 (90 Base) MCG/ACT inhaler Inhale 2 puffs into the lungs every 4 (four) hours as needed for wheezing or shortness of breath.   05/02/2023   aspirin EC 81 MG tablet Take 81 mg by mouth daily.   05/02/2023   cyanocobalamin (VITAMIN B12) 1000 MCG/ML injection Inject 1,000 mcg into the skin every 30 (thirty) days.   Unk   escitalopram (LEXAPRO) 20 MG tablet Take 20 mg by mouth at bedtime.   Past Week   furosemide (LASIX) 20 MG tablet Take 1 tablet (20 mg total) by mouth daily as needed. Take for weight gain over 2-3 lbs in 24 hours (Patient not taking: Reported on 05/03/2023) 45 tablet 3 Not Taking   furosemide (LASIX) 40 MG tablet Take 40 mg by mouth daily.   05/02/2023   imipramine (TOFRANIL) 25 MG tablet Take 25 mg by mouth at bedtime.    Unk   loratadine (CLARITIN) 10 MG tablet Take 10 mg by mouth daily.   05/02/2023   losartan (COZAAR) 100 MG tablet Take 100 mg by mouth daily.   05/02/2023   metoprolol tartrate (LOPRESSOR) 50 MG tablet Take 50 mg by mouth 2 (two) times daily.   05/02/2023 at 0900   omeprazole (PRILOSEC) 40 MG capsule Take 40 mg by mouth daily.   05/02/2023   potassium chloride (MICRO-K) 10 MEQ CR capsule Take 20 mEq by mouth daily.   05/02/2023   rosuvastatin (CRESTOR) 10 MG tablet Take 10 mg by mouth daily.   05/02/2023   Vitamin D, Cholecalciferol, 1000 units TABS Take 1,000 Units by mouth daily.   05/02/2023    Assessment: 72 yo female with h/o HTN, GERD and HLD presents with 2 week h/o SOA. CT angio positive for pulmonary embolism. CBC stable, PLT stable. No PTA anticoagulation noted.   Heparin level therapeutic at 0.40 while on 1150u/hr. No bleeding noted. HgB 13.7 and PLTS 324.   Goal of Therapy:  Heparin level 0.3-0.7 units/ml Monitor platelets by anticoagulation protocol: Yes   Plan:  Continue heparin 1150u/hr.  Recheck confirmatory heparin level in AM.  CBC daily Monitor for bleeding  Estill Batten, PharmD, BCCCP  Clinical Pharmacist [place] Please utilize Amion for appropriate phone number to reach the unit pharmacist [facility])  05/03/2023,10:30 PM

## 2023-05-03 NOTE — Progress Notes (Signed)
ANTICOAGULATION CONSULT NOTE - Initial Consult  Pharmacy Consult for Heparin Indication: pulmonary embolus  Allergies  Allergen Reactions   Ambien [Zolpidem]     Patient Measurements: Height: 5\' 4"  (162.6 cm) Weight: 76.4 kg (168 lb 6.9 oz) IBW/kg (Calculated) : 54.7 Heparin Dosing Weight: 71.7 kg  Vital Signs: Temp: 97.4 F (36.3 C) (06/28 0521) Temp Source: Oral (06/28 0521) BP: 171/91 (06/28 0521) Pulse Rate: 91 (06/28 0521)  Labs: Recent Labs    05/02/23 1507 05/02/23 1739 05/03/23 0446  HGB 13.5  --  13.7  HCT 43.5  --  43.1  PLT 321  --  324  HEPARINUNFRC  --   --  0.83*  CREATININE 1.09*  --   --   TROPONINIHS 11 11  --      Estimated Creatinine Clearance: 46 mL/min (A) (by C-G formula based on SCr of 1.09 mg/dL (H)).   Medical History: Past Medical History:  Diagnosis Date   Anxiety    Essential hypertension    GERD (gastroesophageal reflux disease)    Hyperlipidemia     Medications:  Medications Prior to Admission  Medication Sig Dispense Refill Last Dose   furosemide (LASIX) 20 MG tablet Take 1 tablet (20 mg total) by mouth daily as needed. Take for weight gain over 2-3 lbs in 24 hours 45 tablet 3    albuterol (VENTOLIN HFA) 108 (90 Base) MCG/ACT inhaler       aspirin EC 81 MG tablet Take 81 mg by mouth daily.      escitalopram (LEXAPRO) 10 MG tablet Take 10 mg by mouth daily.      imipramine (TOFRANIL) 25 MG tablet Take 25 mg by mouth at bedtime.      loratadine (CLARITIN) 10 MG tablet Take 10 mg by mouth daily.      metoprolol tartrate (LOPRESSOR) 50 MG tablet Take 50 mg by mouth daily.      omeprazole (PRILOSEC) 40 MG capsule Take 40 mg by mouth daily.      potassium chloride (MICRO-K) 10 MEQ CR capsule Take 10 mEq by mouth 2 (two) times daily.      rosuvastatin (CRESTOR) 10 MG tablet Take 10 mg by mouth daily.      Vitamin D, Cholecalciferol, 1000 units TABS Take 1 tablet by mouth daily.       Assessment: 72 yo female with h/o HTN, GERD  and HLD presents with 2 week h/o SOA. CT angio positive for pulmonary embolism. CBC stable, PLT stable. No PTA anticoagulation noted.   Heparin level supra-therapeutic at 0.83. Drawn from opposite arm, no bleeding noted.   Goal of Therapy:  Heparin level 0.3-0.7 units/ml Monitor platelets by anticoagulation protocol: Yes   Plan:  Decrease heparin drip to 1150 units/hr Recheck anti-xa level in 8 hours.   CBC daily Monitor for bleeding  Estill Batten, PharmD, BCCCP  Clinical Pharmacist [place] Please utilize Amion for appropriate phone number to reach the unit pharmacist [facility])  05/03/2023,6:14 AM

## 2023-05-03 NOTE — TOC CM/SW Note (Signed)
Transition of Care [facility]) - Inpatient Brief Assessment   Patient Details  Name: Tamara Santiago MRN: [id number] Date of Birth: [birth year]  Transition of Care [facility]) CM/SW Contact:    Elliot Gault, LCSW Phone Number: 05/03/2023, 11:05 AM   Clinical Narrative:  Transition of Care Department [facility]) has reviewed patient and no TOC needs have been identified at this time. We will continue to monitor patient advancement through interdisciplinary progression rounds. If new patient transition needs arise, please place a TOC consult.  Transition of Care Asessment: Insurance and Status: Insurance coverage has been reviewed Patient has primary care physician: Yes Home environment has been reviewed: from home Prior level of function:: independent Prior/Current Home Services: No current home services Social Determinants of Health Reivew: SDOH reviewed no interventions necessary Readmission risk has been reviewed: Yes Transition of care needs: no transition of care needs at this time

## 2023-05-03 NOTE — Progress Notes (Signed)
PROGRESS NOTE   Tamara Santiago, is a 72 y.o. female, DOB - 12-Feb-1951, ZOX:[id number]  Admit date - 05/02/2023   Admitting Physician Frankey Shown, DO  Outpatient Primary MD for the patient is Benita Stabile, MD  LOS - 1  Chief Complaint  Patient presents with   Shortness of Breath      Brief Narrative:  72 y.o. female with medical history significant of hypertension, hyperlipidemia, GERD, CHF admitted on 05/02/2023 with acute PE and diastolic CHF exacerbation    -Assessment and Plan: 1)Acute Pulm Embolism----POA CTA chest shows Small acute segmental and subsegmental pulmonary emboli in the left upper lobe. -Dyspnea persist -Lower extremity venous Dopplers negative -Echo as noted below -IV heparin as ordered  2)HFpEF--acute on chronic diastolic CHF--- due to #1 above in the setting of mild to moderate aortic regurg -Chest x-ray, lab findings, clinical exam and CTA chest are consistent with CHF exacerbation with pulmonary edema (BNP 2,111) -Echo from 05/03/2023 with EF of 55% similar to prior from 05/2021, no regional wall motion normalities, grade 2 diastolic dysfunction noted -No mitral stenosis -No aortic stenosis  -Mild to Moderate aortic regurg noted similar to prior -c/n IV Lasix,  -Continue metoprolol and losartan -daily weights, fluid input and output monitoring and Reds Vest  3)Hypokalemia/hypomagnesemia--- due to diuretic use -Some leg cramps most likely related to #3 -Replace and recheck  4)GERD--continue Protonix  5)Tobacco Abuse /COPD--- no acute exacerbation, albuterol as needed advised -Smoking cessation advised -Nicotine patch offered  6)HLD--- continue Crestor  Status is: Inpatient   Disposition: The patient is from: Home              Anticipated d/c is to: Home              Anticipated d/c date is: 2 days              Patient currently is not medically stable to d/c. Barriers: Not Clinically Stable-   Code Status :  -  Code Status: Full Code    Family Communication:    (patient is alert, awake and coherent)  Discussed with daughter Cala Bradford at bedside  DVT Prophylaxis  :   - SCDs   SCDs Start: 05/03/23 0241 -------iv Heparin  Lab Results  Component Value Date   PLT 324 05/03/2023    Inpatient Medications  Scheduled Meds:  feeding supplement  237 mL Oral BID BM   furosemide  40 mg Intravenous Q12H   methocarbamol  750 mg Oral Once   pantoprazole  40 mg Oral Daily   potassium chloride  40 mEq Oral Q3H   rosuvastatin  10 mg Oral Daily   Continuous Infusions:  heparin 1,150 Units/hr (05/03/23 1638)   magnesium sulfate bolus IVPB     PRN Meds:.acetaminophen **OR** acetaminophen, albuterol, ondansetron **OR** ondansetron (ZOFRAN) IV, mouth rinse, perflutren lipid microspheres (DEFINITY) IV suspension   Anti-infectives (From admission, onward)    None         Subjective: Jandy Liby today has no fevers, no emesis,  No chest pain,    Daughter Cala Bradford is at bedside -Dyspnea persist -No hypoxia -Complains of leg cramps  Objective: Vitals:   05/03/23 0529 05/03/23 1025 05/03/23 1116 05/03/23 1303  BP:  (!) 156/83 (!) 154/69 (!) 180/81  Pulse:  88 93 99  Resp:  16  18  Temp:    98 F (36.7 C)  TempSrc:      SpO2:  100%  96%  Weight: 76.4 kg  Height:        Intake/Output Summary (Last 24 hours) at 05/03/2023 1640 Last data filed at 05/03/2023 1029 Gross per 24 hour  Intake 624.78 ml  Output 4050 ml  Net -3425.22 ml   Filed Weights   05/02/23 1418 05/02/23 2202 05/03/23 0529  Weight: 79.4 kg 77.8 kg 76.4 kg    Physical Exam  Gen:- Awake Alert,  in no apparent distress  HEENT:- Plum.AT, No sclera icterus Neck-Supple Neck,No JVD,.  Lungs-diminished breath sounds, no wheezing  CV- S1, S2 normal, irregular , 3/6 SM Abd-  +ve B.Sounds, Abd Soft, No tenderness,    Extremity/Skin:- No  edema, pedal pulses present  Psych-affect is appropriate, oriented x3 Neuro-generalized weakness, no new  focal deficits, no tremors  Data Reviewed: I have personally reviewed following labs and imaging studies  CBC: Recent Labs  Lab 05/02/23 1507 05/03/23 0446  WBC 8.7 9.1  HGB 13.5 13.7  HCT 43.5 43.1  MCV 94.4 92.7  PLT 321 324   Basic Metabolic Panel: Recent Labs  Lab 05/02/23 1507 05/03/23 0446  NA 136 138  K 3.2* 3.1*  CL 98 98  CO2 27 27  GLUCOSE 123* 125*  BUN 19 16  CREATININE 1.09* 0.98  CALCIUM 8.6* 8.5*  MG  --  1.6*  PHOS  --  2.7   GFR: Estimated Creatinine Clearance: 51.2 mL/min (by C-G formula based on SCr of 0.98 mg/dL). Liver Function Tests: Recent Labs  Lab 05/02/23 1507 05/03/23 0446  AST 29 28  ALT 31 29  ALKPHOS 116 114  BILITOT 1.8* 1.7*  PROT 7.8 8.0  ALBUMIN 3.0* 3.2*   Radiology Studies: ECHOCARDIOGRAM COMPLETE  Result Date: 05/03/2023    ECHOCARDIOGRAM REPORT   Patient Name:   Tamara Santiago Date of Exam: 05/03/2023 Medical Rec #:  [id number]       Height:       64.0 in Accession #:    [id number]      Weight:       168.4 lb Date of Birth:  1951-06-21        BSA:          1.819 m Patient Age:    72 years        BP:           180/81 mmHg Patient Gender: F               HR:           96 bpm. [facility] Procedure: 2D Echo, Cardiac Doppler, Color Doppler and Intracardiac            Opacification Agent Indications:    CHF-Acute Systolic I50.21  History:        Patient has prior history of Echocardiogram examinations, most                 recent 10/03/2021. CHF, GERD; Risk Factors:Hypertension,                 Dyslipidemia and Current Smoker.  Sonographer:    Dondra Prader RVT RCS Referring Phys: [id number] OLADAPO ADEFESO  Sonographer Comments: Suboptimal apical window. IMPRESSIONS  1. Left ventricular ejection fraction, by estimation, is 55%. The left ventricle has normal function. The left ventricle has no regional wall motion abnormalities. Left ventricular diastolic parameters are consistent with Grade II diastolic dysfunction  (pseudonormalization). Elevated left ventricular end-diastolic pressure.  2. Right ventricular systolic function is normal. The right ventricular size is normal.  3. Left atrial size was moderately dilated.  4. Right atrial size was mildly dilated.  5. The mitral valve is abnormal. Mild mitral valve regurgitation. No evidence of mitral stenosis.  6. The aortic valve is tricuspid. There is mild calcification of the aortic valve. There is mild thickening of the aortic valve. Aortic valve regurgitation is mild to moderate. Aortic valve sclerosis is present, with no evidence of aortic valve stenosis.  7. The inferior vena cava is normal in size with <50% respiratory variability, suggesting right atrial pressure of 8 mmHg. FINDINGS  Left Ventricle: Left ventricular ejection fraction, by estimation, is 55%. The left ventricle has normal function. The left ventricle has no regional wall motion abnormalities. Definity contrast agent was given IV to delineate the left ventricular endocardial borders. The left ventricular internal cavity size was normal in size. There is no left ventricular hypertrophy. Left ventricular diastolic parameters are consistent with Grade II diastolic dysfunction (pseudonormalization). Elevated left ventricular end-diastolic pressure. Right Ventricle: The right ventricular size is normal. No increase in right ventricular wall thickness. Right ventricular systolic function is normal. Left Atrium: Left atrial size was moderately dilated. Right Atrium: Right atrial size was mildly dilated. Pericardium: Trivial pericardial effusion is present. The pericardial effusion is posterior to the left ventricle. Mitral Valve: The mitral valve is abnormal. There is mild thickening of the mitral valve leaflet(s). Mild mitral valve regurgitation. No evidence of mitral valve stenosis. Tricuspid Valve: The tricuspid valve is normal in structure. Tricuspid valve regurgitation is mild . No evidence of tricuspid  stenosis. Aortic Valve: The aortic valve is tricuspid. There is mild calcification of the aortic valve. There is mild thickening of the aortic valve. Aortic valve regurgitation is mild to moderate. Aortic valve sclerosis is present, with no evidence of aortic valve stenosis. Aortic valve mean gradient measures 5.0 mmHg. Aortic valve peak gradient measures 9.9 mmHg. Aortic valve area, by VTI measures 1.59 cm. Pulmonic Valve: The pulmonic valve was normal in structure. Pulmonic valve regurgitation is not visualized. No evidence of pulmonic stenosis. Aorta: The aortic root is normal in size and structure. Venous: The inferior vena cava is normal in size with less than 50% respiratory variability, suggesting right atrial pressure of 8 mmHg. IAS/Shunts: No atrial level shunt detected by color flow Doppler.  LEFT VENTRICLE PLAX 2D LVIDd:         4.70 cm   Diastology LVIDs:         3.60 cm   LV e' medial:    4.93 cm/s LV PW:         1.10 cm   LV E/e' medial:  20.9 LV IVS:        1.10 cm   LV e' lateral:   6.46 cm/s LVOT diam:     1.80 cm   LV E/e' lateral: 15.9 LV SV:         39 LV SV Index:   21 LVOT Area:     2.54 cm  RIGHT VENTRICLE            IVC RV Basal diam:  3.60 cm    IVC diam: 2.00 cm RV Mid diam:    3.60 cm RV S prime:     9.73 cm/s TAPSE (M-mode): 1.4 cm LEFT ATRIUM             Index LA diam:        4.70 cm 2.58 cm/m LA Vol (A2C):   79.6 ml 43.77 ml/m LA Vol (A4C):  60.6 ml 33.35 ml/m LA Biplane Vol: 80.3 ml 44.15 ml/m  AORTIC VALVE                     PULMONIC VALVE AV Area (Vmax):    1.70 cm      PV Vmax:       0.91 m/s AV Area (Vmean):   1.68 cm      PV Peak grad:  3.3 mmHg AV Area (VTI):     1.59 cm AV Vmax:           157.00 cm/s AV Vmean:          101.000 cm/s AV VTI:            0.243 m AV Peak Grad:      9.9 mmHg AV Mean Grad:      5.0 mmHg LVOT Vmax:         105.00 cm/s LVOT Vmean:        66.800 cm/s LVOT VTI:          0.152 m LVOT/AV VTI ratio: 0.63 AR Vena Contracta: 0.50 cm  AORTA Ao Root  diam: 2.80 cm Ao Asc diam:  3.10 cm MITRAL VALVE                TRICUSPID VALVE MV Area (PHT): 5.84 cm     TR Peak grad:   36.5 mmHg MV Decel Time: 130 msec     TR Vmax:        302.00 cm/s MV E velocity: 103.00 cm/s MV A velocity: 96.80 cm/s   SHUNTS MV E/A ratio:  1.06         Systemic VTI:  0.15 m                             Systemic Diam: 1.80 cm Charlton Haws MD Electronically signed by Charlton Haws MD Signature Date/Time: 05/03/2023/3:18:16 PM    Final    US Venous Img Lower Bilateral (DVT)  Result Date: 05/03/2023 CLINICAL DATA:  Pulmonary embolism and lower extremity edema. EXAM: BILATERAL LOWER EXTREMITY VENOUS DOPPLER ULTRASOUND TECHNIQUE: Gray-scale sonography with graded compression, as well as color Doppler and duplex ultrasound were performed to evaluate the lower extremity deep venous systems from the level of the common femoral vein and including the common femoral, femoral, profunda femoral, popliteal and calf veins including the posterior tibial, peroneal and gastrocnemius veins when visible. The superficial great saphenous vein was also interrogated. Spectral Doppler was utilized to evaluate flow at rest and with distal augmentation maneuvers in the common femoral, femoral and popliteal veins. COMPARISON:  None Available. FINDINGS: RIGHT LOWER EXTREMITY Common Femoral Vein: No evidence of thrombus. Normal compressibility, respiratory phasicity and response to augmentation. Saphenofemoral Junction: No evidence of thrombus. Normal compressibility and flow on color Doppler imaging. Profunda Femoral Vein: No evidence of thrombus. Normal compressibility and flow on color Doppler imaging. Femoral Vein: No evidence of thrombus. Normal compressibility, respiratory phasicity and response to augmentation. Popliteal Vein: No evidence of thrombus. Normal compressibility, respiratory phasicity and response to augmentation. Calf Veins: No evidence of thrombus. Normal compressibility and flow on color Doppler  imaging. Superficial Great Saphenous Vein: No evidence of thrombus. Normal compressibility. Venous Reflux:  None. Other Findings: No evidence of superficial thrombophlebitis or abnormal fluid collection. LEFT LOWER EXTREMITY Common Femoral Vein: No evidence of thrombus. Normal compressibility, respiratory phasicity and response to augmentation. Saphenofemoral Junction: No evidence of thrombus. Normal  compressibility and flow on color Doppler imaging. Profunda Femoral Vein: No evidence of thrombus. Normal compressibility and flow on color Doppler imaging. Femoral Vein: No evidence of thrombus. Normal compressibility, respiratory phasicity and response to augmentation. Popliteal Vein: No evidence of thrombus. Normal compressibility, respiratory phasicity and response to augmentation. Calf Veins: No evidence of thrombus. Normal compressibility and flow on color Doppler imaging. Superficial Great Saphenous Vein: No evidence of thrombus. Normal compressibility. Venous Reflux:  None. Other Findings: No evidence of superficial thrombophlebitis or abnormal fluid collection. IMPRESSION: No evidence of deep venous thrombosis in either lower extremity. Electronically Signed   By: Irish Lack M.D.   On: 05/03/2023 10:40   CT Angio Chest PE W and/or Wo Contrast  Addendum Date: 05/02/2023   ADDENDUM REPORT: 05/02/2023 20:23 ADDENDUM: Critical Value/emergent results were called by telephone at the time of interpretation on 05/02/2023 at 8:20 pm to provider [facility] , who verbally acknowledged these results. Electronically Signed   By: Obie Dredge M.D.   On: 05/02/2023 20:23   Result Date: 05/02/2023 CLINICAL DATA:  Shortness of breath for the past 2 weeks. EXAM: CT ANGIOGRAPHY CHEST WITH CONTRAST TECHNIQUE: Multidetector CT imaging of the chest was performed using the standard protocol during bolus administration of intravenous contrast. Multiplanar CT image reconstructions and MIPs were obtained to evaluate the  vascular anatomy. RADIATION DOSE REDUCTION: This exam was performed according to the departmental dose-optimization program which includes automated exposure control, adjustment of the mA and/or kV according to patient size and/or use of iterative reconstruction technique. CONTRAST:  75mL OMNIPAQUE IOHEXOL 350 MG/ML SOLN COMPARISON:  Chest x-ray from same day. FINDINGS: Cardiovascular: Satisfactory opacification of the pulmonary arteries to the segmental level. Small acute segmental and subsegmental pulmonary emboli in the left upper lobe (series 8, image 33). Moderate cardiomegaly. Reflux of contrast into the IVC and hepatic veins. No pericardial effusion. No thoracic aortic aneurysm. Coronary, aortic arch, and branch vessel atherosclerotic vascular disease. Mediastinum/Nodes: Prominent mediastinal and right hilar lymph nodes measuring up to 1 cm in short axis, likely reactive. No enlarged axillary lymph nodes. Thyroid gland, trachea, and esophagus demonstrate no significant findings. Lungs/Pleura: Trace bilateral pleural effusions, greater on the right. Scattered mild smooth interlobular septal thickening. No consolidation or pneumothorax. Mild paraseptal emphysema. Upper Abdomen: No acute abnormality.  Multiple hepatic simple cysts. Musculoskeletal: No chest wall abnormality. No acute or significant osseous findings. Scoliosis. Chronic T12 compression deformity. Review of the MIP images confirms the above findings. IMPRESSION: 1. Small acute segmental and subsegmental pulmonary emboli in the left upper lobe. 2. Moderate cardiomegaly with mild interstitial pulmonary edema and trace bilateral pleural effusions. 3. Aortic Atherosclerosis (ICD10-I70.0) and Emphysema (ICD10-J43.9). Electronically Signed: By: Obie Dredge M.D. On: 05/02/2023 20:13   DG Chest 2 View  Result Date: 05/02/2023 CLINICAL DATA:  Short of breath for 2 weeks, fluid overload, tachypnea EXAM: CHEST - 2 VIEW COMPARISON:  03/26/2023  FINDINGS: Frontal and lateral views of the chest demonstrates stable enlargement of the cardiac silhouette. There is stable central vascular congestion and interstitial prominence compatible with mild congestive heart failure. No effusion or pneumothorax. No acute bony abnormalities. Stable left convex scoliosis centered at the thoracolumbar junction. IMPRESSION: 1. Mild congestive heart failure. No significant change in volume status. Electronically Signed   By: Sharlet Salina M.D.   On: 05/02/2023 14:59    Scheduled Meds:  feeding supplement  237 mL Oral BID BM   furosemide  40 mg Intravenous Q12H   methocarbamol  750 mg  Oral Once   pantoprazole  40 mg Oral Daily   potassium chloride  40 mEq Oral Q3H   rosuvastatin  10 mg Oral Daily   Continuous Infusions:  heparin 1,150 Units/hr (05/03/23 1638)   magnesium sulfate bolus IVPB       LOS: 1 day   Shon Hale M.D on 05/03/2023 at 4:40 PM  Go to [email or web address] - for contact info  [facility] - Office  [phone]  If 7PM-7AM, please contact night-coverage [email or web address] 05/03/2023, 4:40 PM

## 2023-05-03 NOTE — Progress Notes (Signed)
Patient's heparin drip stopped during patient's ECHO for use of her IV. Flushed well and reconnected post procedure.

## 2023-05-04 DIAGNOSIS — I10 Essential (primary) hypertension: Secondary | ICD-10-CM | POA: Diagnosis not present

## 2023-05-04 DIAGNOSIS — I2699 Other pulmonary embolism without acute cor pulmonale: Secondary | ICD-10-CM | POA: Diagnosis not present

## 2023-05-04 DIAGNOSIS — K219 Gastro-esophageal reflux disease without esophagitis: Secondary | ICD-10-CM | POA: Diagnosis not present

## 2023-05-04 LAB — RENAL FUNCTION PANEL
Albumin: 3 g/dL — ABNORMAL LOW (ref 3.5–5.0)
Anion gap: 12 (ref 5–15)
BUN: 15 mg/dL (ref 8–23)
CO2: 29 mmol/L (ref 22–32)
Calcium: 8.9 mg/dL (ref 8.9–10.3)
Chloride: 98 mmol/L (ref 98–111)
Creatinine, Ser: 0.95 mg/dL (ref 0.44–1.00)
GFR, Estimated: >60 mL/min (ref >60)
Glucose, Bld: 139 mg/dL — ABNORMAL HIGH (ref 70–99)
Phosphorus: 2.8 mg/dL (ref 2.5–4.6)
Potassium: 3.7 mmol/L (ref 3.5–5.1)
Sodium: 139 mmol/L (ref 135–145)

## 2023-05-04 LAB — CBC
HCT: 46.4 % — ABNORMAL HIGH (ref 36.0–46.0)
Hemoglobin: 14.7 g/dL (ref 12.0–15.0)
MCH: 29.5 pg (ref 26.0–34.0)
MCHC: 31.7 g/dL (ref 30.0–36.0)
MCV: 93.2 fL (ref 80.0–100.0)
Platelets: 328 10*3/uL (ref 150–400)
RBC: 4.98 MIL/uL (ref 3.87–5.11)
RDW: 16.6 % — ABNORMAL HIGH (ref 11.5–15.5)
WBC: 10.5 10*3/uL (ref 4.0–10.5)
nRBC: 0 % (ref 0.0–0.2)

## 2023-05-04 LAB — HEPARIN LEVEL (UNFRACTIONATED): Heparin Unfractionated: 0.61 IU/mL (ref 0.30–0.70)

## 2023-05-04 NOTE — Progress Notes (Signed)
Patient given water and meds provided. Tolerated well.

## 2023-05-04 NOTE — Progress Notes (Signed)
Patient's heparin restarted. At this time patient complaining of cramping again. Notified Dr. Marisa Severin and asked if he would speak to patient and family to address their concerns. Patient at that time stated she hadn't had any water all day. Patient had said the same to a EVS worker earlier in the shift, and had gotten water at that time as well. Patient was provided water and pitcher of ice to nibble on. Addressed all patient concerns at that time. Dr. Marisa Severin called via phone and updated patient on plan. Patient new orders at this time as well, including Robaxin for cramping. Tolerated well.

## 2023-05-04 NOTE — Progress Notes (Signed)
PROGRESS NOTE  Tamara Santiago, is a 72 y.o. female, DOB - Aug 26, 1951, ZOX:[id number]  Admit date - 05/02/2023   Admitting Physician Tamara Shown, DO  Outpatient Primary MD for the patient is Tamara Stabile, MD  LOS - 2  Chief Complaint  Patient presents with   Shortness of Breath      Brief Narrative:  72 y.o. female with medical history significant of hypertension, hyperlipidemia, GERD, CHF admitted on 05/02/2023 with acute PE and diastolic CHF exacerbation   -Assessment and Plan: 1)Acute Pulm Embolism----POA CTA chest shows Small acute segmental and subsegmental pulmonary emboli in the left upper lobe. -Lower extremity Venous Dopplers Negative -Echo as noted below 05/04/23 -Dyspnea and dizziness persist -c/n IV heparin as ordered -Consider transition to DOAC when symptoms improve  2)HFpEF--acute on chronic diastolic CHF--- due to #1 above in the setting of mild to moderate aortic regurg -Chest x-ray, lab findings, clinical exam and CTA chest are consistent with CHF exacerbation with pulmonary edema (BNP 2,111) -Echo from 05/03/2023 with EF of 55% similar to prior from 05/2021, no regional wall motion normalities, grade 2 diastolic dysfunction noted -No mitral stenosis -No aortic stenosis  -Mild to Moderate aortic regurg noted similar to prior -c/n IV Lasix,  -Continue metoprolol and losartan -daily weights, fluid input and output monitoring and Reds Vest  3)Hypokalemia/hypomagnesemia--- due to diuretic use -Some leg cramps most likely related to #3 -Replace and recheck  4)GERD--continue Protonix  5)Tobacco Abuse /COPD--- no acute exacerbation, albuterol as needed advised -Smoking cessation advised -Nicotine patch offered  6)HLD--- continue Crestor  7) ambulatory dysfunction/gait concerns/Fall Risk--- even prior to admission patient had dizziness and gait issues, she was getting outpatient physical therapy prior to admission -Will need to resume outpatient PT upon  discharge -Hold off on PT eval at this time given acute PE with risk for clot propagation with aggressive mobilization -Reminded nursing staff that patient is a fall risk--  Status is: Inpatient   Disposition: The patient is from: Home              Anticipated d/c is to: Home              Anticipated d/c date is: 2 days              Patient currently is not medically stable to d/c. Barriers: Not Clinically Stable-   Code Status :  -  Code Status: Full Code   Family Communication:    (patient is alert, awake and coherent)  Discussed with daughters Tamara Santiago and Tamara Santiago at bedside  DVT Prophylaxis  :   - SCDs   SCDs Start: 05/03/23 0241 -------iv Heparin  Lab Results  Component Value Date   PLT 328 05/04/2023   Inpatient Medications  Scheduled Meds:  feeding supplement  237 mL Oral BID BM   furosemide  40 mg Intravenous Q12H   losartan  25 mg Oral Daily   metoprolol tartrate  50 mg Oral BID   nicotine  21 mg Transdermal Daily   pantoprazole  40 mg Oral Daily   rosuvastatin  10 mg Oral Daily   Continuous Infusions:  heparin 1,150 Units/hr (05/04/23 1516)   PRN Meds:.acetaminophen **OR** acetaminophen, albuterol, hydrALAZINE, ondansetron **OR** ondansetron (ZOFRAN) IV, mouth rinse   Anti-infectives (From admission, onward)    None      Subjective: Tamara Santiago today has no fevers, no emesis,  No chest pain,    Daughter Tamara Santiago is at bedside -Dyspnea persist -No hypoxia Got Dizzy when  she got up....  -  Objective: Vitals:   05/03/23 1820 05/03/23 2027 05/04/23 0518 05/04/23 1415  BP: (!) 165/74 (!) 143/89 (!) 143/89 136/85  Pulse: (!) 101 100 85 83  Resp: 20 20 17 20   Temp:  97.9 F (36.6 C) 98.1 F (36.7 C)   TempSrc:      SpO2: 94% 96% 100% 94%  Weight:   74.2 kg 74.8 kg  Height:        Intake/Output Summary (Last 24 hours) at 05/04/2023 1821 Last data filed at 05/04/2023 1700 Gross per 24 hour  Intake 343.5 ml  Output 3100 ml  Net -2756.5 ml    Filed Weights   05/03/23 0529 05/04/23 0518 05/04/23 1415  Weight: 76.4 kg 74.2 kg 74.8 kg   Physical Exam  Gen:- Awake Alert,  in no apparent distress  HEENT:- [place].AT, No sclera icterus Neck-Supple Neck,No JVD,.  Lungs-diminished breath sounds, no wheezing  CV- S1, S2 normal, irregular , 3/6 SM Abd-  +ve B.Sounds, Abd Soft, No tenderness,    Extremity/Skin:- No  edema, pedal pulses present  Psych-affect is appropriate, oriented x3 Neuro-generalized weakness, no new focal deficits, no tremors  Data Reviewed: I have personally reviewed following labs and imaging studies  CBC: Recent Labs  Lab 05/02/23 1507 05/03/23 0446 05/04/23 0506  WBC 8.7 9.1 10.5  HGB 13.5 13.7 14.7  HCT 43.5 43.1 46.4*  MCV 94.4 92.7 93.2  PLT 321 324 328   Basic Metabolic Panel: Recent Labs  Lab 05/02/23 1507 05/03/23 0446 05/04/23 0506  NA 136 138 139  K 3.2* 3.1* 3.7  CL 98 98 98  CO2 27 27 29   GLUCOSE 123* 125* 139*  BUN 19 16 15   CREATININE 1.09* 0.98 0.95  CALCIUM 8.6* 8.5* 8.9  MG  --  1.6*  --   PHOS  --  2.7 2.8   GFR: Estimated Creatinine Clearance: 52.2 mL/min (by C-G formula based on SCr of 0.95 mg/dL). Liver Function Tests: Recent Labs  Lab 05/02/23 1507 05/03/23 0446 05/04/23 0506  AST 29 28  --   ALT 31 29  --   ALKPHOS 116 114  --   BILITOT 1.8* 1.7*  --   PROT 7.8 8.0  --   ALBUMIN 3.0* 3.2* 3.0*   Radiology Studies: ECHOCARDIOGRAM COMPLETE  Result Date: 05/03/2023    ECHOCARDIOGRAM REPORT   Patient Name:   Tamara Santiago Date of Exam: 05/03/2023 Medical Rec #:  [id number]       Height:       64.0 in Accession #:    [id number]      Weight:       168.4 lb Date of Birth:  04-23-1951        BSA:          1.819 m Patient Age:    72 years        BP:           180/81 mmHg Patient Gender: F               HR:           96 bpm. [facility] Procedure: 2D Echo, Cardiac Doppler, Color Doppler and Intracardiac            Opacification Agent Indications:     CHF-Acute Systolic I50.21  History:        Patient has prior history of Echocardiogram examinations, most  recent 10/03/2021. CHF, GERD; Risk Factors:Hypertension,                 Dyslipidemia and Current Smoker.  Sonographer:    Tamara Santiago RVT RCS Referring Phys: Tamara Santiago  Sonographer Comments: Suboptimal apical window. IMPRESSIONS  1. Left ventricular ejection fraction, by estimation, is 55%. The left ventricle has normal function. The left ventricle has no regional wall motion abnormalities. Left ventricular diastolic parameters are consistent with Grade II diastolic dysfunction (pseudonormalization). Elevated left ventricular end-diastolic pressure.  2. Right ventricular systolic function is normal. The right ventricular size is normal.  3. Left atrial size was moderately dilated.  4. Right atrial size was mildly dilated.  5. The mitral valve is abnormal. Mild mitral valve regurgitation. No evidence of mitral stenosis.  6. The aortic valve is tricuspid. There is mild calcification of the aortic valve. There is mild thickening of the aortic valve. Aortic valve regurgitation is mild to moderate. Aortic valve sclerosis is present, with no evidence of aortic valve stenosis.  7. The inferior vena cava is normal in size with <50% respiratory variability, suggesting right atrial pressure of 8 mmHg. FINDINGS  Left Ventricle: Left ventricular ejection fraction, by estimation, is 55%. The left ventricle has normal function. The left ventricle has no regional wall motion abnormalities. Definity contrast agent was given IV to delineate the left ventricular endocardial borders. The left ventricular internal cavity size was normal in size. There is no left ventricular hypertrophy. Left ventricular diastolic parameters are consistent with Grade II diastolic dysfunction (pseudonormalization). Elevated left ventricular end-diastolic pressure. Right Ventricle: The right ventricular size is normal.  No increase in right ventricular wall thickness. Right ventricular systolic function is normal. Left Atrium: Left atrial size was moderately dilated. Right Atrium: Right atrial size was mildly dilated. Pericardium: Trivial pericardial effusion is present. The pericardial effusion is posterior to the left ventricle. Mitral Valve: The mitral valve is abnormal. There is mild thickening of the mitral valve leaflet(s). Mild mitral valve regurgitation. No evidence of mitral valve stenosis. Tricuspid Valve: The tricuspid valve is normal in structure. Tricuspid valve regurgitation is mild . No evidence of tricuspid stenosis. Aortic Valve: The aortic valve is tricuspid. There is mild calcification of the aortic valve. There is mild thickening of the aortic valve. Aortic valve regurgitation is mild to moderate. Aortic valve sclerosis is present, with no evidence of aortic valve stenosis. Aortic valve mean gradient measures 5.0 mmHg. Aortic valve peak gradient measures 9.9 mmHg. Aortic valve area, by VTI measures 1.59 cm. Pulmonic Valve: The pulmonic valve was normal in structure. Pulmonic valve regurgitation is not visualized. No evidence of pulmonic stenosis. Aorta: The aortic root is normal in size and structure. Venous: The inferior vena cava is normal in size with less than 50% respiratory variability, suggesting right atrial pressure of 8 mmHg. IAS/Shunts: No atrial level shunt detected by color flow Doppler.  LEFT VENTRICLE PLAX 2D LVIDd:         4.70 cm   Diastology LVIDs:         3.60 cm   LV e' medial:    4.93 cm/s LV PW:         1.10 cm   LV E/e' medial:  20.9 LV IVS:        1.10 cm   LV e' lateral:   6.46 cm/s LVOT diam:     1.80 cm   LV E/e' lateral: 15.9 LV SV:         39  LV SV Index:   21 LVOT Area:     2.54 cm  RIGHT VENTRICLE            IVC RV Basal diam:  3.60 cm    IVC diam: 2.00 cm RV Mid diam:    3.60 cm RV S prime:     9.73 cm/s TAPSE (M-mode): 1.4 cm LEFT ATRIUM             Index LA diam:        4.70  cm 2.58 cm/m LA Vol (A2C):   79.6 ml 43.77 ml/m LA Vol (A4C):   60.6 ml 33.35 ml/m LA Biplane Vol: 80.3 ml 44.15 ml/m  AORTIC VALVE                     PULMONIC VALVE AV Area (Vmax):    1.70 cm      PV Vmax:       0.91 m/s AV Area (Vmean):   1.68 cm      PV Peak grad:  3.3 mmHg AV Area (VTI):     1.59 cm AV Vmax:           157.00 cm/s AV Vmean:          101.000 cm/s AV VTI:            0.243 m AV Peak Grad:      9.9 mmHg AV Mean Grad:      5.0 mmHg LVOT Vmax:         105.00 cm/s LVOT Vmean:        66.800 cm/s LVOT VTI:          0.152 m LVOT/AV VTI ratio: 0.63 AR Vena Contracta: 0.50 cm  AORTA Ao Root diam: 2.80 cm Ao Asc diam:  3.10 cm MITRAL VALVE                TRICUSPID VALVE MV Area (PHT): 5.84 cm     TR Peak grad:   36.5 mmHg MV Decel Time: 130 msec     TR Vmax:        302.00 cm/s MV E velocity: 103.00 cm/s MV A velocity: 96.80 cm/s   SHUNTS MV E/A ratio:  1.06         Systemic VTI:  0.15 m                             Systemic Diam: 1.80 cm Charlton Haws MD Electronically signed by Charlton Haws MD Signature Date/Time: 05/03/2023/3:18:16 PM    Final    US Venous Img Lower Bilateral (DVT)  Result Date: 05/03/2023 CLINICAL DATA:  Pulmonary embolism and lower extremity edema. EXAM: BILATERAL LOWER EXTREMITY VENOUS DOPPLER ULTRASOUND TECHNIQUE: Gray-scale sonography with graded compression, as well as color Doppler and duplex ultrasound were performed to evaluate the lower extremity deep venous systems from the level of the common femoral vein and including the common femoral, femoral, profunda femoral, popliteal and calf veins including the posterior tibial, peroneal and gastrocnemius veins when visible. The superficial great saphenous vein was also interrogated. Spectral Doppler was utilized to evaluate flow at rest and with distal augmentation maneuvers in the common femoral, femoral and popliteal veins. COMPARISON:  None Available. FINDINGS: RIGHT LOWER EXTREMITY Common Femoral Vein: No evidence of  thrombus. Normal compressibility, respiratory phasicity and response to augmentation. Saphenofemoral Junction: No evidence of thrombus. Normal compressibility and flow on color Doppler imaging. Profunda Femoral  Vein: No evidence of thrombus. Normal compressibility and flow on color Doppler imaging. Femoral Vein: No evidence of thrombus. Normal compressibility, respiratory phasicity and response to augmentation. Popliteal Vein: No evidence of thrombus. Normal compressibility, respiratory phasicity and response to augmentation. Calf Veins: No evidence of thrombus. Normal compressibility and flow on color Doppler imaging. Superficial Great Saphenous Vein: No evidence of thrombus. Normal compressibility. Venous Reflux:  None. Other Findings: No evidence of superficial thrombophlebitis or abnormal fluid collection. LEFT LOWER EXTREMITY Common Femoral Vein: No evidence of thrombus. Normal compressibility, respiratory phasicity and response to augmentation. Saphenofemoral Junction: No evidence of thrombus. Normal compressibility and flow on color Doppler imaging. Profunda Femoral Vein: No evidence of thrombus. Normal compressibility and flow on color Doppler imaging. Femoral Vein: No evidence of thrombus. Normal compressibility, respiratory phasicity and response to augmentation. Popliteal Vein: No evidence of thrombus. Normal compressibility, respiratory phasicity and response to augmentation. Calf Veins: No evidence of thrombus. Normal compressibility and flow on color Doppler imaging. Superficial Great Saphenous Vein: No evidence of thrombus. Normal compressibility. Venous Reflux:  None. Other Findings: No evidence of superficial thrombophlebitis or abnormal fluid collection. IMPRESSION: No evidence of deep venous thrombosis in either lower extremity. Electronically Signed   By: Irish Lack M.D.   On: 05/03/2023 10:40   CT Angio Chest PE W and/or Wo Contrast  Addendum Date: 05/02/2023   ADDENDUM REPORT:  05/02/2023 20:23 ADDENDUM: Critical Value/emergent results were called by telephone at the time of interpretation on 05/02/2023 at 8:20 pm to provider MADISON [facility] , who verbally acknowledged these results. Electronically Signed   By: Obie Dredge M.D.   On: 05/02/2023 20:23   Result Date: 05/02/2023 CLINICAL DATA:  Shortness of breath for the past 2 weeks. EXAM: CT ANGIOGRAPHY CHEST WITH CONTRAST TECHNIQUE: Multidetector CT imaging of the chest was performed using the standard protocol during bolus administration of intravenous contrast. Multiplanar CT image reconstructions and MIPs were obtained to evaluate the vascular anatomy. RADIATION DOSE REDUCTION: This exam was performed according to the departmental dose-optimization program which includes automated exposure control, adjustment of the mA and/or kV according to patient size and/or use of iterative reconstruction technique. CONTRAST:  75mL OMNIPAQUE IOHEXOL 350 MG/ML SOLN COMPARISON:  Chest x-ray from same day. FINDINGS: Cardiovascular: Satisfactory opacification of the pulmonary arteries to the segmental level. Small acute segmental and subsegmental pulmonary emboli in the left upper lobe (series 8, image 33). Moderate cardiomegaly. Reflux of contrast into the IVC and hepatic veins. No pericardial effusion. No thoracic aortic aneurysm. Coronary, aortic arch, and branch vessel atherosclerotic vascular disease. Mediastinum/Nodes: Prominent mediastinal and right hilar lymph nodes measuring up to 1 cm in short axis, likely reactive. No enlarged axillary lymph nodes. Thyroid gland, trachea, and esophagus demonstrate no significant findings. Lungs/Pleura: Trace bilateral pleural effusions, greater on the right. Scattered mild smooth interlobular septal thickening. No consolidation or pneumothorax. Mild paraseptal emphysema. Upper Abdomen: No acute abnormality.  Multiple hepatic simple cysts. Musculoskeletal: No chest wall abnormality. No acute or  significant osseous findings. Scoliosis. Chronic T12 compression deformity. Review of the MIP images confirms the above findings. IMPRESSION: 1. Small acute segmental and subsegmental pulmonary emboli in the left upper lobe. 2. Moderate cardiomegaly with mild interstitial pulmonary edema and trace bilateral pleural effusions. 3. Aortic Atherosclerosis (ICD10-I70.0) and Emphysema (ICD10-J43.9). Electronically Signed: By: Obie Dredge M.D. On: 05/02/2023 20:13    Scheduled Meds:  feeding supplement  237 mL Oral BID BM   furosemide  40 mg Intravenous Q12H   losartan  25 mg Oral Daily   metoprolol tartrate  50 mg Oral BID   nicotine  21 mg Transdermal Daily   pantoprazole  40 mg Oral Daily   rosuvastatin  10 mg Oral Daily   Continuous Infusions:  heparin 1,150 Units/hr (05/04/23 1516)    LOS: 2 days   Shon Hale M.D on 05/04/2023 at 6:21 PM  Go to [email or web address] - for contact info  [facility] - Office  [phone]  If 7PM-7AM, please contact night-coverage [email or web address] 05/04/2023, 6:21 PM

## 2023-05-04 NOTE — Progress Notes (Signed)
Pt remains on IV heparin. No c/o pain today. Pt remains A&Ox4 and in bed resting. Nurse and NT offered to get pt up this morning but patient refused. No concerns at this time. Writer has been checking on patient periodically throughout the day.

## 2023-05-04 NOTE — Progress Notes (Signed)
ANTICOAGULATION CONSULT NOTE -  Pharmacy Consult for Heparin Indication: pulmonary embolus  Allergies  Allergen Reactions   Ambien [Zolpidem]     Patient Measurements: Height: 5\' 4"  (162.6 cm) Weight: 74.2 kg (163 lb 9.3 oz) IBW/kg (Calculated) : 54.7 Heparin Dosing Weight: 71.7 kg  Vital Signs: Temp: 98.1 F (36.7 C) (06/29 0518) BP: 143/89 (06/29 0518) Pulse Rate: 85 (06/29 0518)  Labs: Recent Labs    05/02/23 1507 05/02/23 1739 05/03/23 0446 05/03/23 2159 05/04/23 0506  HGB 13.5  --  13.7  --  14.7  HCT 43.5  --  43.1  --  46.4*  PLT 321  --  324  --  328  HEPARINUNFRC  --   --  0.83* 0.40 0.61  CREATININE 1.09*  --  0.98  --  0.95  TROPONINIHS 11 11  --   --   --      Estimated Creatinine Clearance: 52 mL/min (by C-G formula based on SCr of 0.95 mg/dL).   Medical History: Past Medical History:  Diagnosis Date   Anxiety    Essential hypertension    GERD (gastroesophageal reflux disease)    Hyperlipidemia     Medications:  Medications Prior to Admission  Medication Sig Dispense Refill Last Dose   acetaminophen (TYLENOL) 500 MG tablet Take 500-1,000 mg by mouth every 6 (six) hours as needed for moderate pain.   05/02/2023   albuterol (VENTOLIN HFA) 108 (90 Base) MCG/ACT inhaler Inhale 2 puffs into the lungs every 4 (four) hours as needed for wheezing or shortness of breath.   05/02/2023   aspirin EC 81 MG tablet Take 81 mg by mouth daily.   05/02/2023   cyanocobalamin (VITAMIN B12) 1000 MCG/ML injection Inject 1,000 mcg into the skin every 30 (thirty) days.   Unk   escitalopram (LEXAPRO) 20 MG tablet Take 20 mg by mouth at bedtime.   Past Week   furosemide (LASIX) 20 MG tablet Take 1 tablet (20 mg total) by mouth daily as needed. Take for weight gain over 2-3 lbs in 24 hours (Patient not taking: Reported on 05/03/2023) 45 tablet 3 Not Taking   furosemide (LASIX) 40 MG tablet Take 40 mg by mouth daily.   05/02/2023   imipramine (TOFRANIL) 25 MG tablet Take 25  mg by mouth at bedtime.   Unk   loratadine (CLARITIN) 10 MG tablet Take 10 mg by mouth daily.   05/02/2023   losartan (COZAAR) 100 MG tablet Take 100 mg by mouth daily.   05/02/2023   metoprolol tartrate (LOPRESSOR) 50 MG tablet Take 50 mg by mouth 2 (two) times daily.   05/02/2023 at 0900   omeprazole (PRILOSEC) 40 MG capsule Take 40 mg by mouth daily.   05/02/2023   potassium chloride (MICRO-K) 10 MEQ CR capsule Take 20 mEq by mouth daily.   05/02/2023   rosuvastatin (CRESTOR) 10 MG tablet Take 10 mg by mouth daily.   05/02/2023   Vitamin D, Cholecalciferol, 1000 units TABS Take 1,000 Units by mouth daily.   05/02/2023    Assessment: Tamara Santiago with h/o HTN, GERD and HLD presents with 2 week h/o SOA. CT angio positive for pulmonary embolism. CBC stable, PLT stable. No PTA anticoagulation noted.   Heparin level therapeutic at 0.61 while on 1150u/hr. No bleeding noted. CBC WNL  Goal of Therapy:  Heparin level 0.3-0.7 units/ml Monitor platelets by anticoagulation protocol: Yes   Plan:  Continue heparin 1150 units/hr.  Heparin and CBC daily Monitor for bleeding  Judeth Cornfield, PharmD Clinical Pharmacist 05/04/2023 7:49 AM

## 2023-05-04 NOTE — Progress Notes (Signed)
   05/04/23 1000  ReDS Vest / Clip  Station Marker B  Ruler Value 34  ReDS Value Range < 36  ReDS Actual Value 31

## 2023-05-04 NOTE — Progress Notes (Signed)
Patient having ECHO, tech notified that patient would need meds pushed. Patient with one IV, flushed line and heparin placed on hold for completion of ECHO only. Asked patient if cramping was better, patient stated she had none at that time.

## 2023-05-04 NOTE — Progress Notes (Addendum)
Pt is c/o headache and dizziness when standing up. NT stated pt was very wobbly when standing. Pt did not have good balance. Tylenol given for headache and MD aware of dizziness when standing.  VS: BP 174/89 (111), sPO2 96% on room air, Temp 98.4. R 24, HR 88. Hydralazine to be given. Md aware.

## 2023-05-04 NOTE — Progress Notes (Signed)
Patient complained of cramping from feet and hands. Notified Dr. Marisa Severin. No new orders at this time.

## 2023-05-05 DIAGNOSIS — I10 Essential (primary) hypertension: Secondary | ICD-10-CM | POA: Diagnosis not present

## 2023-05-05 DIAGNOSIS — I2699 Other pulmonary embolism without acute cor pulmonale: Secondary | ICD-10-CM | POA: Diagnosis not present

## 2023-05-05 DIAGNOSIS — E876 Hypokalemia: Secondary | ICD-10-CM | POA: Diagnosis not present

## 2023-05-05 DIAGNOSIS — I5033 Acute on chronic diastolic (congestive) heart failure: Secondary | ICD-10-CM | POA: Diagnosis not present

## 2023-05-05 LAB — RENAL FUNCTION PANEL
Albumin: 3 g/dL — ABNORMAL LOW (ref 3.5–5.0)
Anion gap: 11 (ref 5–15)
BUN: 16 mg/dL (ref 8–23)
CO2: 26 mmol/L (ref 22–32)
Calcium: 9 mg/dL (ref 8.9–10.3)
Chloride: 96 mmol/L — ABNORMAL LOW (ref 98–111)
Creatinine, Ser: 0.94 mg/dL (ref 0.44–1.00)
GFR, Estimated: >60 mL/min (ref >60)
Glucose, Bld: 125 mg/dL — ABNORMAL HIGH (ref 70–99)
Phosphorus: 3.5 mg/dL (ref 2.5–4.6)
Potassium: 2.9 mmol/L — ABNORMAL LOW (ref 3.5–5.1)
Sodium: 133 mmol/L — ABNORMAL LOW (ref 135–145)

## 2023-05-05 LAB — CBC
HCT: 47.8 % — ABNORMAL HIGH (ref 36.0–46.0)
Hemoglobin: 15.4 g/dL — ABNORMAL HIGH (ref 12.0–15.0)
MCH: 29.6 pg (ref 26.0–34.0)
MCHC: 32.2 g/dL (ref 30.0–36.0)
MCV: 91.7 fL (ref 80.0–100.0)
Platelets: 362 10*3/uL (ref 150–400)
RBC: 5.21 MIL/uL — ABNORMAL HIGH (ref 3.87–5.11)
RDW: 16.3 % — ABNORMAL HIGH (ref 11.5–15.5)
WBC: 9.3 10*3/uL (ref 4.0–10.5)
nRBC: 0 % (ref 0.0–0.2)

## 2023-05-05 LAB — MAGNESIUM: Magnesium: 1.8 mg/dL (ref 1.7–2.4)

## 2023-05-05 LAB — HEPARIN LEVEL (UNFRACTIONATED): Heparin Unfractionated: 0.6 IU/mL (ref 0.30–0.70)

## 2023-05-05 MED ORDER — POTASSIUM CHLORIDE CRYS ER 20 MEQ PO TBCR: 40.0000 meq | EXTENDED_RELEASE_TABLET | ORAL | Status: DC

## 2023-05-05 MED ORDER — FUROSEMIDE 10 MG/ML IJ SOLN
40.0000 mg | Freq: Every day | INTRAMUSCULAR | Status: DC
  Administered 2023-05-06: 40 mg via INTRAVENOUS
  Filled 2023-05-05: qty 4

## 2023-05-05 MED ORDER — METHOCARBAMOL 500 MG PO TABS
750.0000 mg | ORAL_TABLET | Freq: Once | ORAL | Status: DC
  Filled 2023-05-05: qty 2

## 2023-05-05 MED ORDER — POLYETHYLENE GLYCOL 3350 17 G PO PACK
17.0000 g | PACK | Freq: Two times a day (BID) | ORAL | Status: DC
  Administered 2023-05-05 – 2023-05-06 (×3): 17 g via ORAL
  Filled 2023-05-05 (×3): qty 1

## 2023-05-05 MED ORDER — POTASSIUM CHLORIDE CRYS ER 20 MEQ PO TBCR
40.0000 meq | EXTENDED_RELEASE_TABLET | ORAL | Status: AC
  Administered 2023-05-05 (×2): 40 meq via ORAL
  Filled 2023-05-05 (×2): qty 2

## 2023-05-05 NOTE — Progress Notes (Signed)
PROGRESS NOTE  Tamara Santiago, is a 72 y.o. female, DOB - [birth year], VHQ:[id number]  Admit date - 05/02/2023   Admitting Physician Tamara Shown, DO  Outpatient Primary MD for the patient is Tamara Stabile, MD  LOS - 3  Chief Complaint  Patient presents with   Shortness of Breath      Brief Narrative:  73 y.o. female with medical history significant of hypertension, hyperlipidemia, GERD, CHF admitted on 05/02/2023 with acute PE and diastolic CHF exacerbation   -Assessment and Plan: 1)Acute Pulm Embolism----POA CTA chest shows Small acute segmental and subsegmental pulmonary emboli in the left upper lobe. -Lower extremity Venous Dopplers Negative -Echo as noted below 05/05/23 -Dyspnea and dizziness improving,  patient is now up in the chair -No hypoxia -c/n IV heparin as ordered -Consider transition to DOAC  - hopefully on 05/06/2023  2)HFpEF--acute on chronic diastolic CHF--- due to #1 above in the setting of mild to moderate aortic regurg -Chest x-ray, lab findings, clinical exam and CTA chest are consistent with CHF exacerbation with pulmonary edema (BNP 2,111) -Echo from 05/03/2023 with EF of 55% similar to prior from 05/2021, no regional wall motion normalities, grade 2 diastolic dysfunction noted -No mitral stenosis -No aortic stenosis  -Mild to Moderate aortic regurg noted similar to prior --Continue metoprolol and losartan -daily weights, fluid input and output monitoring and Reds Vest 05/05/23 -Weight is down to 164 from 171 pounds -Negative fluid balance -REDs Vest --is down to 29 (WNL) -Decrease Lasix to 40 mg daily from twice daily  3)Hypokalemia/hypomagnesemia--- due to diuretic use -Some leg cramps most likely related to #3 -Replace and recheck electrolytes  4)GERD--continue Protonix  5)Tobacco Abuse /COPD--- no acute exacerbation, albuterol as needed advised -Smoking cessation advised -Nicotine patch offered  6)HLD--- continue Crestor  7) ambulatory  dysfunction/gait concerns/Fall Risk--- even prior to admission patient had dizziness and gait issues, she was getting outpatient physical therapy prior to admission -Will need to resume outpatient PT upon discharge -Patient has been on IV heparin for day 3... ok to Get official PT eval  Status is: Inpatient   Disposition: The patient is from: Home              Anticipated d/c is to: Home with home health services              Anticipated d/c date is: 1 day              Patient currently is not medically stable to d/c. Barriers: Not Clinically Stable-   Code Status :  -  Code Status: Full Code   Family Communication:    (patient is alert, awake and coherent)  Recently discussed with daughters Tamara Santiago and Tamara Santiago and granddaughter Tamara Santiago at bedside  DVT Prophylaxis  :   - SCDs   SCDs Start: 05/03/23 0241 -------iv Heparin  Lab Results  Component Value Date   PLT 362 05/05/2023   Inpatient Medications  Scheduled Meds:  feeding supplement  237 mL Oral BID BM   [START ON 05/06/2023] furosemide  40 mg Intravenous Daily   losartan  25 mg Oral Daily   methocarbamol  750 mg Oral Once   metoprolol tartrate  50 mg Oral BID   nicotine  21 mg Transdermal Daily   pantoprazole  40 mg Oral Daily   polyethylene glycol  17 g Oral BID   rosuvastatin  10 mg Oral Daily   Continuous Infusions:  heparin 1,150 Units/hr (05/04/23 1516)   PRN Meds:.acetaminophen **OR** acetaminophen,  albuterol, hydrALAZINE, ondansetron **OR** ondansetron (ZOFRAN) IV, mouth rinse   Anti-infectives (From admission, onward)    None      Subjective: Tamara Santiago today has no fevers, no emesis,  No chest pain,    Grand-Daughter Tamara Santiago is at bedside Dizziness has improved patient is up in the chair -Dyspnea is improving -No BM  Objective: Vitals:   05/04/23 1929 05/04/23 2057 05/05/23 0416 05/05/23 0609  BP: (!) 130/53 (!) 146/63 (!) 149/69   Pulse: 88 86 77   Resp: 17  17   Temp: 97.8 F (36.6 C)  98.2  F (36.8 C)   TempSrc:   Oral   SpO2: 97%  97%   Weight:    74.6 kg  Height:        Intake/Output Summary (Last 24 hours) at 05/05/2023 1405 Last data filed at 05/05/2023 1035 Gross per 24 hour  Intake 672.38 ml  Output 2650 ml  Net -1977.62 ml   Filed Weights   05/04/23 0518 05/04/23 1415 05/05/23 0609  Weight: 74.2 kg 74.8 kg 74.6 kg   Physical Exam  Gen:- Awake Alert,  in no apparent distress  HEENT:- [place].AT, No sclera icterus Neck-Supple Neck,No JVD,.  Lungs--improving air movement, no wheezing  CV- S1, S2 normal, irregular , 3/6 SM Abd-  +ve B.Sounds, Abd Soft, No tenderness,    Extremity/Skin:- No  edema, pedal pulses present  Psych-affect is appropriate, oriented x3 Neuro-generalized weakness, no new focal deficits, no tremors  Data Reviewed: I have personally reviewed following labs and imaging studies  CBC: Recent Labs  Lab 05/02/23 1507 05/03/23 0446 05/04/23 0506 05/05/23 0456  WBC 8.7 9.1 10.5 9.3  HGB 13.5 13.7 14.7 15.4*  HCT 43.5 43.1 46.4* 47.8*  MCV 94.4 92.7 93.2 91.7  PLT 321 324 328 362   Basic Metabolic Panel: Recent Labs  Lab 05/02/23 1507 05/03/23 0446 05/04/23 0506 05/05/23 0456  NA 136 138 139 133*  K 3.2* 3.1* 3.7 2.9*  CL 98 98 98 96*  CO2 27 27 29 26   GLUCOSE 123* 125* 139* 125*  BUN 19 16 15 16   CREATININE 1.09* 0.98 0.95 0.94  CALCIUM 8.6* 8.5* 8.9 9.0  MG  --  1.6*  --  1.8  PHOS  --  2.7 2.8 3.5   GFR: Estimated Creatinine Clearance: 52.8 mL/min (by C-G formula based on SCr of 0.94 mg/dL). Liver Function Tests: Recent Labs  Lab 05/02/23 1507 05/03/23 0446 05/04/23 0506 05/05/23 0456  AST 29 28  --   --   ALT 31 29  --   --   ALKPHOS 116 114  --   --   BILITOT 1.8* 1.7*  --   --   PROT 7.8 8.0  --   --   ALBUMIN 3.0* 3.2* 3.0* 3.0*   Radiology Studies: ECHOCARDIOGRAM COMPLETE  Result Date: 05/03/2023    ECHOCARDIOGRAM REPORT   Patient Name:   Tamara Santiago Date of Exam: 05/03/2023 Medical Rec #:  [id number]        Height:       64.0 in Accession #:    [id number]      Weight:       168.4 lb Date of Birth:  1951-05-17        BSA:          1.819 m Patient Age:    72 years        BP:           180/81 mmHg Patient  Gender: F               HR:           96 bpm. [facility] Procedure: 2D Echo, Cardiac Doppler, Color Doppler and Intracardiac            Opacification Agent Indications:    CHF-Acute Systolic I50.21  History:        Patient has prior history of Echocardiogram examinations, most                 recent 10/03/2021. CHF, GERD; Risk Factors:Hypertension,                 Dyslipidemia and Current Smoker.  Sonographer:    Dondra Prader RVT RCS Referring Phys: [id number] OLADAPO ADEFESO  Sonographer Comments: Suboptimal apical window. IMPRESSIONS  1. Left ventricular ejection fraction, by estimation, is 55%. The left ventricle has normal function. The left ventricle has no regional wall motion abnormalities. Left ventricular diastolic parameters are consistent with Grade II diastolic dysfunction (pseudonormalization). Elevated left ventricular end-diastolic pressure.  2. Right ventricular systolic function is normal. The right ventricular size is normal.  3. Left atrial size was moderately dilated.  4. Right atrial size was mildly dilated.  5. The mitral valve is abnormal. Mild mitral valve regurgitation. No evidence of mitral stenosis.  6. The aortic valve is tricuspid. There is mild calcification of the aortic valve. There is mild thickening of the aortic valve. Aortic valve regurgitation is mild to moderate. Aortic valve sclerosis is present, with no evidence of aortic valve stenosis.  7. The inferior vena cava is normal in size with <50% respiratory variability, suggesting right atrial pressure of 8 mmHg. FINDINGS  Left Ventricle: Left ventricular ejection fraction, by estimation, is 55%. The left ventricle has normal function. The left ventricle has no regional wall motion abnormalities. Definity contrast  agent was given IV to delineate the left ventricular endocardial borders. The left ventricular internal cavity size was normal in size. There is no left ventricular hypertrophy. Left ventricular diastolic parameters are consistent with Grade II diastolic dysfunction (pseudonormalization). Elevated left ventricular end-diastolic pressure. Right Ventricle: The right ventricular size is normal. No increase in right ventricular wall thickness. Right ventricular systolic function is normal. Left Atrium: Left atrial size was moderately dilated. Right Atrium: Right atrial size was mildly dilated. Pericardium: Trivial pericardial effusion is present. The pericardial effusion is posterior to the left ventricle. Mitral Valve: The mitral valve is abnormal. There is mild thickening of the mitral valve leaflet(s). Mild mitral valve regurgitation. No evidence of mitral valve stenosis. Tricuspid Valve: The tricuspid valve is normal in structure. Tricuspid valve regurgitation is mild . No evidence of tricuspid stenosis. Aortic Valve: The aortic valve is tricuspid. There is mild calcification of the aortic valve. There is mild thickening of the aortic valve. Aortic valve regurgitation is mild to moderate. Aortic valve sclerosis is present, with no evidence of aortic valve stenosis. Aortic valve mean gradient measures 5.0 mmHg. Aortic valve peak gradient measures 9.9 mmHg. Aortic valve area, by VTI measures 1.59 cm. Pulmonic Valve: The pulmonic valve was normal in structure. Pulmonic valve regurgitation is not visualized. No evidence of pulmonic stenosis. Aorta: The aortic root is normal in size and structure. Venous: The inferior vena cava is normal in size with less than 50% respiratory variability, suggesting right atrial pressure of 8 mmHg. IAS/Shunts: No atrial level shunt detected by color flow Doppler.  LEFT VENTRICLE PLAX 2D LVIDd:  4.70 cm   Diastology LVIDs:         3.60 cm   LV e' medial:    4.93 cm/s LV PW:          1.10 cm   LV E/e' medial:  20.9 LV IVS:        1.10 cm   LV e' lateral:   6.46 cm/s LVOT diam:     1.80 cm   LV E/e' lateral: 15.9 LV SV:         39 LV SV Index:   21 LVOT Area:     2.54 cm  RIGHT VENTRICLE            IVC RV Basal diam:  3.60 cm    IVC diam: 2.00 cm RV Mid diam:    3.60 cm RV S prime:     9.73 cm/s TAPSE (M-mode): 1.4 cm LEFT ATRIUM             Index LA diam:        4.70 cm 2.58 cm/m LA Vol (A2C):   79.6 ml 43.77 ml/m LA Vol (A4C):   60.6 ml 33.35 ml/m LA Biplane Vol: 80.3 ml 44.15 ml/m  AORTIC VALVE                     PULMONIC VALVE AV Area (Vmax):    1.70 cm      PV Vmax:       0.91 m/s AV Area (Vmean):   1.68 cm      PV Peak grad:  3.3 mmHg AV Area (VTI):     1.59 cm AV Vmax:           157.00 cm/s AV Vmean:          101.000 cm/s AV VTI:            0.243 m AV Peak Grad:      9.9 mmHg AV Mean Grad:      5.0 mmHg LVOT Vmax:         105.00 cm/s LVOT Vmean:        66.800 cm/s LVOT VTI:          0.152 m LVOT/AV VTI ratio: 0.63 AR Vena Contracta: 0.50 cm  AORTA Ao Root diam: 2.80 cm Ao Asc diam:  3.10 cm MITRAL VALVE                TRICUSPID VALVE MV Area (PHT): 5.84 cm     TR Peak grad:   36.5 mmHg MV Decel Time: 130 msec     TR Vmax:        302.00 cm/s MV E velocity: 103.00 cm/s MV A velocity: 96.80 cm/s   SHUNTS MV E/A ratio:  1.06         Systemic VTI:  0.15 m                             Systemic Diam: 1.80 cm Charlton Haws MD Electronically signed by Charlton Haws MD Signature Date/Time: 05/03/2023/3:18:16 PM    Final     Scheduled Meds:  feeding supplement  237 mL Oral BID BM   [START ON 05/06/2023] furosemide  40 mg Intravenous Daily   losartan  25 mg Oral Daily   methocarbamol  750 mg Oral Once   metoprolol tartrate  50 mg Oral BID   nicotine  21 mg Transdermal Daily   pantoprazole  40 mg Oral Daily   polyethylene glycol  17 g Oral BID   rosuvastatin  10 mg Oral Daily   Continuous Infusions:  heparin 1,150 Units/hr (05/04/23 1516)    LOS: 3 days   Shon Hale M.D on  05/05/2023 at 2:05 PM  Go to [email or web address] - for contact info  [facility] - Office  [phone]  If 7PM-7AM, please contact night-coverage [email or web address] 05/05/2023, 2:05 PM

## 2023-05-05 NOTE — Progress Notes (Signed)
   05/05/23 1200  ReDS Vest / Clip  Station Marker B  Ruler Value 34  ReDS Value Range < 36  ReDS Actual Value 29   MD made aware

## 2023-05-05 NOTE — Progress Notes (Signed)
ANTICOAGULATION CONSULT NOTE -  Pharmacy Consult for Heparin Indication: pulmonary embolus  Allergies  Allergen Reactions   Ambien [Zolpidem]     Patient Measurements: Height: 5\' 4"  (162.6 cm) Weight: 74.6 kg (164 lb 7.4 oz) IBW/kg (Calculated) : 54.7 Heparin Dosing Weight: 71.7 kg  Vital Signs: Temp: 98.2 F (36.8 C) (06/30 0416) Temp Source: Oral (06/30 0416) BP: 149/69 (06/30 0416) Pulse Rate: 77 (06/30 0416)  Labs: Recent Labs    05/02/23 1507 05/02/23 1507 05/02/23 1739 05/03/23 0446 05/03/23 2159 05/04/23 0506 05/05/23 0456  HGB 13.5  --   --  13.7  --  14.7 15.4*  HCT 43.5  --   --  43.1  --  46.4* 47.8*  PLT 321  --   --  324  --  328 362  HEPARINUNFRC  --    < >  --  0.83* 0.40 0.61 0.60  CREATININE 1.09*  --   --  0.98  --  0.95 0.94  TROPONINIHS 11  --  11  --   --   --   --    < > = values in this interval not displayed.     Estimated Creatinine Clearance: 52.8 mL/min (by C-G formula based on SCr of 0.94 mg/dL).   Medical History: Past Medical History:  Diagnosis Date   Anxiety    Essential hypertension    GERD (gastroesophageal reflux disease)    Hyperlipidemia     Medications:  Medications Prior to Admission  Medication Sig Dispense Refill Last Dose   acetaminophen (TYLENOL) 500 MG tablet Take 500-1,000 mg by mouth every 6 (six) hours as needed for moderate pain.   05/02/2023   albuterol (VENTOLIN HFA) 108 (90 Base) MCG/ACT inhaler Inhale 2 puffs into the lungs every 4 (four) hours as needed for wheezing or shortness of breath.   05/02/2023   aspirin EC 81 MG tablet Take 81 mg by mouth daily.   05/02/2023   cyanocobalamin (VITAMIN B12) 1000 MCG/ML injection Inject 1,000 mcg into the skin every 30 (thirty) days.   Unk   escitalopram (LEXAPRO) 20 MG tablet Take 20 mg by mouth at bedtime.   Past Week   furosemide (LASIX) 20 MG tablet Take 1 tablet (20 mg total) by mouth daily as needed. Take for weight gain over 2-3 lbs in 24 hours (Patient not  taking: Reported on 05/03/2023) 45 tablet 3 Not Taking   furosemide (LASIX) 40 MG tablet Take 40 mg by mouth daily.   05/02/2023   imipramine (TOFRANIL) 25 MG tablet Take 25 mg by mouth at bedtime.   Unk   loratadine (CLARITIN) 10 MG tablet Take 10 mg by mouth daily.   05/02/2023   losartan (COZAAR) 100 MG tablet Take 100 mg by mouth daily.   05/02/2023   metoprolol tartrate (LOPRESSOR) 50 MG tablet Take 50 mg by mouth 2 (two) times daily.   05/02/2023 at 0900   omeprazole (PRILOSEC) 40 MG capsule Take 40 mg by mouth daily.   05/02/2023   potassium chloride (MICRO-K) 10 MEQ CR capsule Take 20 mEq by mouth daily.   05/02/2023   rosuvastatin (CRESTOR) 10 MG tablet Take 10 mg by mouth daily.   05/02/2023   Vitamin D, Cholecalciferol, 1000 units TABS Take 1,000 Units by mouth daily.   05/02/2023    Assessment: 72 yo female with h/o HTN, GERD and HLD presents with 2 week h/o SOA. CT angio positive for pulmonary embolism. CBC stable, PLT stable. No PTA anticoagulation noted.  Heparin level therapeutic at 0.60 while on 1150u/hr. No bleeding noted. CBC WNL  Goal of Therapy:  Heparin level 0.3-0.7 units/ml Monitor platelets by anticoagulation protocol: Yes   Plan:  Continue heparin 1150 units/hr.  Heparin and CBC daily Monitor for bleeding  Judeth Cornfield, PharmD Clinical Pharmacist 05/05/2023 7:34 AM

## 2023-05-05 NOTE — Progress Notes (Signed)
  I called daughter Corrie Dandy at [phone], updated her on plan of care -questions answered- - Previously spoke with Kris Mouton -daughter Manley Mason at bedside during rounds   Shon Hale, MD

## 2023-05-05 NOTE — Progress Notes (Signed)
No events overnight, patient lying in bed with eyes closed at this time with regular, unlabored breathing. Heart rate mid 70's on the monitor.

## 2023-05-06 ENCOUNTER — Other Ambulatory Visit (HOSPITAL_COMMUNITY): Payer: Self-pay

## 2023-05-06 LAB — BASIC METABOLIC PANEL
Anion gap: 10 (ref 5–15)
BUN: 21 mg/dL (ref 8–23)
CO2: 25 mmol/L (ref 22–32)
Calcium: 9.2 mg/dL (ref 8.9–10.3)
Chloride: 99 mmol/L (ref 98–111)
Creatinine, Ser: 1.01 mg/dL — ABNORMAL HIGH (ref 0.44–1.00)
GFR, Estimated: 59 mL/min — ABNORMAL LOW (ref >60)
Glucose, Bld: 137 mg/dL — ABNORMAL HIGH (ref 70–99)
Potassium: 3.9 mmol/L (ref 3.5–5.1)
Sodium: 134 mmol/L — ABNORMAL LOW (ref 135–145)

## 2023-05-06 LAB — CBC
HCT: 46.8 % — ABNORMAL HIGH (ref 36.0–46.0)
Hemoglobin: 15 g/dL (ref 12.0–15.0)
MCH: 29.5 pg (ref 26.0–34.0)
MCHC: 32.1 g/dL (ref 30.0–36.0)
MCV: 91.9 fL (ref 80.0–100.0)
Platelets: 333 10*3/uL (ref 150–400)
RBC: 5.09 MIL/uL (ref 3.87–5.11)
RDW: 16.2 % — ABNORMAL HIGH (ref 11.5–15.5)
WBC: 7.9 10*3/uL (ref 4.0–10.5)
nRBC: 0 % (ref 0.0–0.2)

## 2023-05-06 LAB — HEPARIN LEVEL (UNFRACTIONATED): Heparin Unfractionated: 0.82 IU/mL — ABNORMAL HIGH (ref 0.30–0.70)

## 2023-05-06 MED ORDER — ELIQUIS DVT/PE STARTER PACK 5 MG PO TBPK: ORAL_TABLET | ORAL | 0 refills | Status: DC

## 2023-05-06 MED ORDER — NICOTINE 21 MG/24HR TD PT24: 21.0000 mg | MEDICATED_PATCH | Freq: Every day | TRANSDERMAL | 0 refills | Status: DC

## 2023-05-06 MED ORDER — FUROSEMIDE 40 MG PO TABS: 40.0000 mg | ORAL_TABLET | Freq: Every day | ORAL | 11 refills | Status: DC

## 2023-05-06 MED ORDER — LOSARTAN POTASSIUM 100 MG PO TABS: 100.0000 mg | ORAL_TABLET | Freq: Every day | ORAL | 5 refills | Status: AC

## 2023-05-06 MED ORDER — APIXABAN 5 MG PO TABS: 5.0000 mg | ORAL_TABLET | Freq: Two times a day (BID) | ORAL | 4 refills | Status: AC

## 2023-05-06 MED ORDER — POTASSIUM CHLORIDE ER 20 MEQ PO TBCR: 20.0000 meq | EXTENDED_RELEASE_TABLET | Freq: Every day | ORAL | 1 refills | Status: DC

## 2023-05-06 MED ORDER — ASPIRIN EC 81 MG PO TBEC: 81.0000 mg | DELAYED_RELEASE_TABLET | Freq: Every day | ORAL | 11 refills | Status: DC

## 2023-05-06 MED ORDER — ROSUVASTATIN CALCIUM 10 MG PO TABS: 10.0000 mg | ORAL_TABLET | Freq: Every day | ORAL | 3 refills | Status: AC

## 2023-05-06 MED ORDER — CYANOCOBALAMIN 1000 MCG/ML IJ SOLN: 1000.0000 ug | INTRAMUSCULAR | 2 refills | Status: AC

## 2023-05-06 MED ORDER — ALBUTEROL SULFATE HFA 108 (90 BASE) MCG/ACT IN AERS: 2.0000 | INHALATION_SPRAY | RESPIRATORY_TRACT | Status: AC | PRN

## 2023-05-06 NOTE — Care Management Important Message (Signed)
Important Message  Patient Details  Name: Tamara Santiago MRN: [id number] Date of Birth: [birth year]   Medicare Important Message Given:  Yes     Corey Harold 05/06/2023, 12:54 PM

## 2023-05-06 NOTE — Evaluation (Signed)
Physical Therapy Evaluation Patient Details Name: Tamara Santiago MRN: [id number] DOB: 1951/08/19 Today's Date: 05/06/2023  History of Present Illness  Tamara Santiago is a 72 y.o. female with medical history significant of hypertension, hyperlipidemia, GERD, CHF who presents to the emergency department due to 2-week onset of shortness of breath.  She complained of onset of progressive dyspnea which worsens on exertion and with difficulty in being able to lay flat in bed.  Patient has been taking Lasix as an outpatient, but symptoms persist and within the last 2 to 3 days, symptoms have worsened which resulted in her presenting to the ED for further evaluation and management.   Clinical Impression  Patient demonstrates labored movement for gait training with mostly step-to pattern using quad-cane without loss of balance, more steady and safer using RW with good return for use demonstrated.  Patient encouraged to use RW when feeling weaker or long distances.  PLAN:  Patient to be discharged home today and discharged from acute physical therapy to care of nursing for ambulation as tolerated for length of stay with recommendations stated below         Assistance Recommended at Discharge Set up Supervision/Assistance  If plan is discharge home, recommend the following:  Can travel by private vehicle  A little help with walking and/or transfers;A little help with bathing/dressing/bathroom;Help with stairs or ramp for entrance;Assistance with cooking/housework        Equipment Recommendations Rolling walker (2 wheels)  Recommendations for Other Services       Functional Status Assessment Patient has had a recent decline in their functional status and demonstrates the ability to make significant improvements in function in a reasonable and predictable amount of time.     Precautions / Restrictions Precautions Precautions: Fall Restrictions Weight Bearing Restrictions: No      Mobility   Bed Mobility Overal bed mobility: Needs Assistance Bed Mobility: Supine to Sit     Supine to sit: Supervision     General bed mobility comments: slightly labored movement    Transfers Overall transfer level: Needs assistance Equipment used: None, 1 person hand held assist, Quad cane, Rolling walker (2 wheels) Transfers: Sit to/from Stand, Bed to chair/wheelchair/BSC Sit to Stand: Supervision, Min guard   Step pivot transfers: Supervision, Min guard       General transfer comment: has to lean on armrest of chair when not unsing an AD, safer using Quad-cane or RW    Ambulation/Gait Ambulation/Gait assistance: Supervision, Min guard Gait Distance (Feet): 65 Feet Assistive device: Quad cane, Rolling walker (2 wheels) Gait Pattern/deviations: Step-to pattern, Decreased step length - left, Decreased stance time - right, Decreased stride length Gait velocity: decreased     General Gait Details: slightly labored cadence with mostly step-to pattern using quad-cane without loss of balance and good return demonstrated for using RW  Stairs            Wheelchair Mobility     Tilt Bed    Modified Rankin (Stroke Patients Only)       Balance Overall balance assessment: Needs assistance Sitting-balance support: Feet supported, No upper extremity supported Sitting balance-Leahy Scale: Good Sitting balance - Comments: seated at EOB   Standing balance support: During functional activity, Single extremity supported Standing balance-Leahy Scale: Fair Standing balance comment: fair/good using Quad-cane, good using RW                             Pertinent Vitals/Pain Pain  Assessment Pain Assessment: No/denies pain    Home Living Family/patient expects to be discharged to:: Private residence Living Arrangements: Alone Available Help at Discharge: Family;Available PRN/intermittently Type of Home: House Home Access: Stairs to enter Entrance Stairs-Rails:  None Entrance Stairs-Number of Steps: 1 Alternate Level Stairs-Number of Steps: 15 steps to basement Home Layout: Multi-level;Laundry or work area in basement;Able to live on main level with bedroom/bathroom Home Equipment: Cane - quad;Grab bars - tub/shower;Shower seat      Prior Function Prior Level of Function : Independent/Modified Independent             Mobility Comments: Retail buyer Quad-cane PRN, does not drive ADLs Comments: Assisted by family     Hand Dominance   Dominant Hand: Right    Extremity/Trunk Assessment   Upper Extremity Assessment Upper Extremity Assessment: Overall WFL for tasks assessed    Lower Extremity Assessment Lower Extremity Assessment: Generalized weakness    Cervical / Trunk Assessment Cervical / Trunk Assessment: Normal  Communication   Communication: No difficulties  Cognition Arousal/Alertness: Awake/alert Behavior During Therapy: WFL for tasks assessed/performed Overall Cognitive Status: Within Functional Limits for tasks assessed                                          General Comments      Exercises     Assessment/Plan    PT Assessment All further PT needs can be met in the next venue of care  PT Problem List Decreased strength;Decreased activity tolerance;Decreased balance;Decreased mobility       PT Treatment Interventions      PT Goals (Current goals can be found in the Care Plan section)  Acute Rehab PT Goals Patient Stated Goal: return home with family to assist PT Goal Formulation: With patient/family Time For Goal Achievement: 05/06/23 Potential to Achieve Goals: Good    Frequency       Co-evaluation               AM-PAC PT "6 Clicks" Mobility  Outcome Measure Help needed turning from your back to your side while in a flat bed without using bedrails?: None Help needed moving from lying on your back to sitting on the side of a flat bed without using bedrails?:  None Help needed moving to and from a bed to a chair (including a wheelchair)?: A Little Help needed standing up from a chair using your arms (e.g., wheelchair or bedside chair)?: None Help needed to walk in hospital room?: A Little Help needed climbing 3-5 steps with a railing? : A Little 6 Click Score: 21    End of Session   Activity Tolerance: Patient tolerated treatment well;Patient limited by fatigue Patient left: in chair;with call bell/phone within reach;with family/visitor present Nurse Communication: Mobility status PT Visit Diagnosis: Unsteadiness on feet (R26.81);Other abnormalities of gait and mobility (R26.89);Muscle weakness (generalized) (M62.81)    Time: 1610-9604 PT Time Calculation (min) (ACUTE ONLY): 25 min   Charges:   PT Evaluation $PT Eval Moderate Complexity: 1 Mod PT Treatments $Therapeutic Activity: 23-37 mins PT General Charges $$ ACUTE PT VISIT: 1 Visit         1:52 PM, 05/06/23 Ocie Bob, MPT Physical Therapist with [facility] [phone] office [phone] mobile phone

## 2023-05-06 NOTE — TOC Benefit Eligibility Note (Signed)
Pharmacy Patient Advocate Encounter  Insurance verification completed.    The patient is insured through [employer] Part D  Ran test claim for Eliquis 5 mg and the current 30 day co-pay is $0.00.   This test claim was processed through [facility]- copay amounts may vary at other pharmacies due to pharmacy/plan contracts, or as the patient moves through the different stages of their insurance plan.    Roland Earl, CPHT Pharmacy Patient Advocate Specialist [facility] Patient Advocate Team Direct Number: [phone]  Fax: [phone]

## 2023-05-06 NOTE — TOC Transition Note (Addendum)
Transition of Care [facility]) - CM/SW Discharge Note   Patient Details  Name: Tamara Santiago MRN: [id number] Date of Birth: August 18, 1951  Transition of Care [facility]) CM/SW Contact:  Elliot Gault, LCSW Phone Number: 05/06/2023, 11:17 AM   Clinical Narrative:     Pt stable for dc and PT recommending HH. Spoke with pt and daughter who are interested in receiving [facility] RN and PT. CMS provider options reviewed. Referral sent to [facility] at pt request. RW provided at PT recommendation and pt request.  No other TOC needs for dc.  1202: Updated by MD that pt/family prefer for pt to have outpatient PT rather than HH. Spoke with dtr to confirm. She requests referral to the [facility] clinic on [facility].   Referral made. Cancelled HH referral.  Expected Discharge Plan: Home w Home Health Services Barriers to Discharge: Barriers Resolved   Patient Goals and CMS Choice Patient states their goals for this hospitalization and ongoing recovery are:: return home CMS Medicare.gov Compare Post Acute Care list provided to:: Patient Choice offered to / list presented to : Patient  Expected Discharge Plan and Services Expected Discharge Plan: Home w Home Health Services In-house Referral: Clinical Social Work   Post Acute Care Choice: Home Health Living arrangements for the past 2 months: Single Family Home                           HH Arranged: RN, PT HH Agency: [facility] Date [facility] Agency Contacted: 05/06/23   Representative spoke with at [facility] Agency: Sarah  Prior Living Arrangements/Services Living arrangements for the past 2 months: Single Family Home Lives with:: Self              Current home services: DME    Activities of Daily Living Home Assistive Devices/Equipment: Cane (specify quad or straight), Built-in shower seat ADL Screening (condition at time of admission) Patient's cognitive ability adequate to safely complete daily activities?: Yes Is the patient deaf or have  difficulty hearing?: No Does the patient have difficulty seeing, even when wearing glasses/contacts?: No Does the patient have difficulty concentrating, remembering, or making decisions?: No Patient able to express need for assistance with ADLs?: Yes Does the patient have difficulty dressing or bathing?: No Independently performs ADLs?: No Communication: Independent Dressing (OT): Independent Grooming: Independent Feeding: Independent Bathing: Independent Toileting: Independent In/Out Bed: Independent Walks in Home: Independent with device (comment) Does the patient have difficulty walking or climbing stairs?: No Weakness of Legs: None Weakness of Arms/Hands: None  Permission Sought/Granted                  Emotional Assessment              Admission diagnosis:  CHF (congestive heart failure) (HCC) [I50.9] Dyspnea, unspecified type [R06.00] Multiple subsegmental pulmonary emboli without acute cor pulmonale (HCC) [I26.94] Patient Active Problem List   Diagnosis Date Noted   Acute pulmonary embolism (HCC) 05/03/2023   Hypoalbuminemia due to protein-calorie malnutrition (HCC) 05/03/2023   Essential hypertension 05/03/2023   Mixed hyperlipidemia 05/03/2023   GERD (gastroesophageal reflux disease) 05/03/2023   Asthma, chronic 05/03/2023   CHF (congestive heart failure) (HCC) 05/02/2023   Acute cholecystitis 09/23/2018   Hypokalemia 09/23/2018   Cholangitis 09/23/2018   Sepsis (HCC) 09/23/2018   PCP:  Benita Stabile, MD Pharmacy:   [facility] - [place], [place] - [address] [place] [address] Phone: [phone] Fax:  [phone]  [facility] - [place], [place] - [address] [place] [address] Phone: [phone] Fax: [phone]     Social Determinants of Health (SDOH) Interventions    Readmission Risk Interventions     No data to display           Final  next level of care: Home w Home Health Services Barriers to Discharge: Barriers Resolved   Patient Goals and CMS Choice CMS Medicare.gov Compare Post Acute Care list provided to:: Patient Choice offered to / list presented to : Patient  Discharge Placement                         Discharge Plan and Services Additional resources added to the After Visit Summary for   In-house Referral: Clinical Social Work   Post Acute Care Choice: Home Health                    HH Arranged: RN, PT [facility] Agency: [facility] Date [facility] Agency Contacted: 05/06/23   Representative spoke with at [facility] Agency: Maralyn Sago  Social Determinants of Health (SDOH) Interventions SDOH Screenings   Food Insecurity: No Food Insecurity (05/05/2023)  Housing: Low Risk  (05/05/2023)  Transportation Needs: No Transportation Needs (05/05/2023)  Utilities: Not At Risk (05/05/2023)  Tobacco Use: High Risk (05/02/2023)     Readmission Risk Interventions     No data to display

## 2023-05-06 NOTE — Progress Notes (Signed)
No events over night, no needs expressed at this time. No PRN medications needed on this shift.

## 2023-05-06 NOTE — Discharge Summary (Signed)
Tamara Santiago, is a 72 y.o. female  DOB [birth year]  MRN [id number].  Admission date:  05/02/2023  Admitting Physician  Frankey Shown, DO  Discharge Date:  05/06/2023   Primary MD  Benita Stabile, MD  Recommendations for primary care physician for things to follow:   1)Take Eliquis/Apixaban which is a blood thinner as prescribed , you will take 2 tablets twice a day for a week and then 1 tablet twice a day after that to finish the initial pack then , you will start your next prescription in about 28 days or so after you complete the initial dose pack  2)Avoid ibuprofen/Advil/Aleve/Motrin/Goody Powders/Naproxen/BC powders/Meloxicam/Diclofenac/Indomethacin and other Nonsteroidal anti-inflammatory medications as these will make you more likely to bleed and can cause stomach ulcers, can also cause Kidney problems.  -You may take Tylenol or acetaminophen as needed  3)Very low-salt diet advised, less than 2 g of sodium per day advised  4)Weigh yourself daily, call if you gain more than 3 pounds in 1 day or more than 5 pounds in 1 week as your diuretic medications may need to be adjusted  5)Limit your Fluid  intake to no more than 60 ounces (1.8 Liters) per day  6)Outpatient physical therapy advised--- please use a walker to prevent falls  7) repeat CBC and BMP blood test with primary care physician in about 1 week  8) okay to Stop aspirin while taking Eliquis/apixaban since you do Not have a stent in the heart  Admission Diagnosis  CHF (congestive heart failure) (HCC) [I50.9] Dyspnea, unspecified type [R06.00] Multiple subsegmental pulmonary emboli without acute cor pulmonale (HCC) [I26.94]   Discharge Diagnosis  CHF (congestive heart failure) (HCC) [I50.9] Dyspnea, unspecified type [R06.00] Multiple subsegmental pulmonary emboli without acute cor pulmonale (HCC) [I26.94]    Principal Problem:   CHF  (congestive heart failure) (HCC) Active Problems:   Hypokalemia   Acute pulmonary embolism (HCC)   Hypoalbuminemia due to protein-calorie malnutrition (HCC)   Essential hypertension   Mixed hyperlipidemia   GERD (gastroesophageal reflux disease)   Asthma, chronic      Past Medical History:  Diagnosis Date   Anxiety    Essential hypertension    GERD (gastroesophageal reflux disease)    Hyperlipidemia     Past Surgical History:  Procedure Laterality Date   CHOLECYSTECTOMY N/A 09/24/2018   Procedure: LAPAROSCOPIC CHOLECYSTECTOMY;  Surgeon: Franky Macho, MD;  Location: [facility];  Service: General;  Laterality: N/A;   TUBAL LIGATION       HPI  from the history and physical done on the day of admission:     Chief Complaint:     Chief Complaint  Patient presents with   Shortness of Breath    HPI: Tamara Santiago is a 72 y.o. female with medical history significant of hypertension, hyperlipidemia, GERD, CHF who presents to the emergency department due to 2-week onset of shortness of breath.  She complained of onset of progressive dyspnea which worsens on exertion and with difficulty in being able to lay flat  in bed.  Patient has been taking Lasix as an outpatient, but symptoms persist and within the last 2 to 3 days, symptoms have worsened which resulted in her presenting to the ED for further evaluation and management.   ED Course:  In the emergency department, temperature was 97.4 F, respiratory rate 24/min, BP 180/91, other vital signs were within normal range.  Workup in the ED showed normal CBC, BMP except for potassium of 3.2, blood glucose 123, creatinine 1.09, albumin 3.0.  Troponin x 2 was flat at 11, lipase 32, BNP 2111. CT angiography chest with contrast showed small acute segmental and subsegmental pulmonary emboli in the left upper lobe.  Moderate cardiomegaly with mild interstitial pulmonary edema and trace bilateral pleural effusions. Chest x-ray showed mild congestive  heart failure. Patient was treated with IV Lasix 40 mg, potassium was replenished, magnesium was given, patient was started on heparin drip due to PE. Hospitalist was asked to admit patient for further evaluation and management.   Review of Systems: Review of systems as noted in the HPI. All other systems reviewed and are negative.   Hospital Course:   Brief Narrative:  72 y.o. female with medical history significant of hypertension, hyperlipidemia, GERD, CHF admitted on 05/02/2023 with acute PE and diastolic CHF exacerbation   -Assessment and Plan: 1)Acute Pulm Embolism----POA CTA chest shows Small acute segmental and subsegmental pulmonary emboli in the left upper lobe. -Lower extremity Venous Dopplers Negative -Echo as noted below -Dyspnea at rest has resolved, dyspnea on exertion has improved   -patient is ambulating on RA, -No hypoxia -Treated with IV heparin   - transitioned to DOAC   -No need for aspirin while on Eliquis   2)HFpEF--acute on chronic diastolic CHF--- due to #1 above in the setting of mild to moderate aortic regurg -Chest x-ray, lab findings, clinical exam and CTA chest are consistent with CHF exacerbation with pulmonary edema (BNP 2,111) -Echo from 05/03/2023 with EF of 55% similar to prior from 05/2021, no regional wall motion normalities, grade 2 diastolic dysfunction noted -No mitral stenosis -No aortic stenosis  -Mild to Moderate aortic regurg noted similar to prior --Continue metoprolol and losartan -Weight is down to 161 from 171 pounds -Negative fluid balance -Discharge home on oral Lasix -Low-salt diet advised   3)Hypokalemia/hypomagnesemia--- due to diuretic use -Some leg cramps most likely related to #3 -Replaced, continue to take potassium while on Lasix   4)GERD--continue Protonix   5)Tobacco Abuse /COPD--- no acute exacerbation, albuterol as needed advised -Smoking cessation advised -Nicotine patch offered   6)HLD--- continue Crestor    7) ambulatory dysfunction/gait concerns/Fall Risk--- even prior to admission patient had dizziness and gait issues, she was getting outpatient physical therapy prior to admission -Will need to resume outpatient PT upon discharge  8) mild hyponatremia--- due to Lasix use,  -Avoid excessive free water    Disposition: The patient is from: Home              Anticipated d/c is to: Home with home health services   Family Communication:    (patient is alert, awake and coherent)  Discussed with daughters Cala Bradford and Corrie Dandy and granddaughter Evoni at bedside  Discharge Condition: stable  Follow UP   Follow-up Information     Benita Stabile, MD. Schedule an appointment as soon as possible for a visit in 1 week(s).   Specialty: Internal Medicine Why: Repeat CBC and BMP blood tests Contact information: [address] [place] [address] [phone]  Diet and Activity recommendation:  As advised  Discharge Instructions   Discharge Instructions     Ambulatory referral to Physical Therapy   Complete by: As directed    Call MD for:  difficulty breathing, headache or visual disturbances   Complete by: As directed    Call MD for:  extreme fatigue   Complete by: As directed    Call MD for:  persistant dizziness or light-headedness   Complete by: As directed    Call MD for:  persistant nausea and vomiting   Complete by: As directed    Call MD for:  temperature >100.4   Complete by: As directed    Diet - low sodium heart healthy   Complete by: As directed    Discharge instructions   Complete by: As directed    1)Take Eliquis/Apixaban which is a blood thinner as prescribed , you will take 2 tablets twice a day for a week and then 1 tablet twice a day after that to finish the initial pack then , you will start your next prescription in about 28 days or so after you complete the initial dose pack  2)Avoid ibuprofen/Advil/Aleve/Motrin/Goody Powders/Naproxen/BC  powders/Meloxicam/Diclofenac/Indomethacin and other Nonsteroidal anti-inflammatory medications as these will make you more likely to bleed and can cause stomach ulcers, can also cause Kidney problems.  -You may take Tylenol or acetaminophen as needed  3)Very low-salt diet advised, less than 2 g of sodium per day advised  4)Weigh yourself daily, call if you gain more than 3 pounds in 1 day or more than 5 pounds in 1 week as your diuretic medications may need to be adjusted  5)Limit your Fluid  intake to no more than 60 ounces (1.8 Liters) per day  6)Outpatient physical therapy advised--- please use a walker to prevent falls  7) repeat CBC and BMP blood test with primary care physician in about 1 week  8) okay to Stop aspirin while taking Eliquis/apixaban since you do Not have a stent in the heart   Increase activity slowly   Complete by: As directed         Discharge Medications     Allergies as of 05/06/2023       Reactions   Ambien [zolpidem]         Medication List     STOP taking these medications    aspirin EC 81 MG tablet   potassium chloride 10 MEQ CR capsule Commonly known as: MICRO-K Replaced by: Potassium Chloride ER 20 MEQ Tbcr       TAKE these medications    acetaminophen 500 MG tablet Commonly known as: TYLENOL Take 500-1,000 mg by mouth every 6 (six) hours as needed for moderate pain.   albuterol 108 (90 Base) MCG/ACT inhaler Commonly known as: VENTOLIN HFA Inhale 2 puffs into the lungs every 4 (four) hours as needed for wheezing or shortness of breath.   cyanocobalamin 1000 MCG/ML injection Commonly known as: VITAMIN B12 Inject 1 mL (1,000 mcg total) into the skin every 30 (thirty) days.   Eliquis DVT/PE Starter Pack Generic drug: Apixaban Starter Pack (10mg  and 5mg ) Take as directed on package: start with two-5mg  tablets twice daily for 7 days. On day 8, switch to one-5mg  tablet twice daily.   apixaban 5 MG Tabs tablet Commonly known as:  ELIQUIS Take 1 tablet (5 mg total) by mouth 2 (two) times daily. Please start this dose in about 28 days or so after you complete the initial dose pack Start taking  on: June 03, 2023   escitalopram 20 MG tablet Commonly known as: LEXAPRO Take 20 mg by mouth at bedtime.   furosemide 40 MG tablet Commonly known as: LASIX Take 1 tablet (40 mg total) by mouth daily. What changed: Another medication with the same name was removed. Continue taking this medication, and follow the directions you see here.   imipramine 25 MG tablet Commonly known as: TOFRANIL Take 25 mg by mouth at bedtime.   loratadine 10 MG tablet Commonly known as: CLARITIN Take 10 mg by mouth daily.   losartan 100 MG tablet Commonly known as: COZAAR Take 1 tablet (100 mg total) by mouth daily.   metoprolol tartrate 50 MG tablet Commonly known as: LOPRESSOR Take 50 mg by mouth 2 (two) times daily.   nicotine 21 mg/24hr patch Commonly known as: NICODERM CQ - dosed in mg/24 hours Place 1 patch (21 mg total) onto the skin daily. Start taking on: May 07, 2023   omeprazole 40 MG capsule Commonly known as: PRILOSEC Take 40 mg by mouth daily.   Potassium Chloride ER 20 MEQ Tbcr Take 1 tablet (20 mEq total) by mouth daily. 1 tab daily by mouth--- take while taking Lasix/furosemide Replaces: potassium chloride 10 MEQ CR capsule   rosuvastatin 10 MG tablet Commonly known as: CRESTOR Take 1 tablet (10 mg total) by mouth daily.   Vitamin D (Cholecalciferol) 25 MCG (1000 UT) Tabs Take 1,000 Units by mouth daily.               Durable Medical Equipment  (From admission, onward)           Start     Ordered   05/06/23 0943  For home use only DME Walker rolling  Once       Comments: Patient unsteady using quad-cane, safer using RW with good return for use demonstrated.  Question Answer Comment  Walker: With 5 Inch Wheels   Patient needs a walker to treat with the following condition Gait difficulty       05/06/23 0942            Major procedures and Radiology Reports - PLEASE review detailed and final reports for all details, in brief -   ECHOCARDIOGRAM COMPLETE  Result Date: 05/03/2023    ECHOCARDIOGRAM REPORT   Patient Name:   Tamara Santiago Date of Exam: 05/03/2023 Medical Rec #:  [id number]       Height:       64.0 in Accession #:    [id number]      Weight:       168.4 lb Date of Birth:  02/06/1951        BSA:          1.819 m Patient Age:    72 years        BP:           180/81 mmHg Patient Gender: F               HR:           96 bpm. [facility] Procedure: 2D Echo, Cardiac Doppler, Color Doppler and Intracardiac            Opacification Agent Indications:    CHF-Acute Systolic I50.21  History:        Patient has prior history of Echocardiogram examinations, most                 recent 10/03/2021. CHF, GERD; Risk Factors:Hypertension,  Dyslipidemia and Current Smoker.  Sonographer:    Dondra Prader RVT RCS Referring Phys: [id number] OLADAPO ADEFESO  Sonographer Comments: Suboptimal apical window. IMPRESSIONS  1. Left ventricular ejection fraction, by estimation, is 55%. The left ventricle has normal function. The left ventricle has no regional wall motion abnormalities. Left ventricular diastolic parameters are consistent with Grade II diastolic dysfunction (pseudonormalization). Elevated left ventricular end-diastolic pressure.  2. Right ventricular systolic function is normal. The right ventricular size is normal.  3. Left atrial size was moderately dilated.  4. Right atrial size was mildly dilated.  5. The mitral valve is abnormal. Mild mitral valve regurgitation. No evidence of mitral stenosis.  6. The aortic valve is tricuspid. There is mild calcification of the aortic valve. There is mild thickening of the aortic valve. Aortic valve regurgitation is mild to moderate. Aortic valve sclerosis is present, with no evidence of aortic valve stenosis.  7. The inferior vena  cava is normal in size with <50% respiratory variability, suggesting right atrial pressure of 8 mmHg. FINDINGS  Left Ventricle: Left ventricular ejection fraction, by estimation, is 55%. The left ventricle has normal function. The left ventricle has no regional wall motion abnormalities. Definity contrast agent was given IV to delineate the left ventricular endocardial borders. The left ventricular internal cavity size was normal in size. There is no left ventricular hypertrophy. Left ventricular diastolic parameters are consistent with Grade II diastolic dysfunction (pseudonormalization). Elevated left ventricular end-diastolic pressure. Right Ventricle: The right ventricular size is normal. No increase in right ventricular wall thickness. Right ventricular systolic function is normal. Left Atrium: Left atrial size was moderately dilated. Right Atrium: Right atrial size was mildly dilated. Pericardium: Trivial pericardial effusion is present. The pericardial effusion is posterior to the left ventricle. Mitral Valve: The mitral valve is abnormal. There is mild thickening of the mitral valve leaflet(s). Mild mitral valve regurgitation. No evidence of mitral valve stenosis. Tricuspid Valve: The tricuspid valve is normal in structure. Tricuspid valve regurgitation is mild . No evidence of tricuspid stenosis. Aortic Valve: The aortic valve is tricuspid. There is mild calcification of the aortic valve. There is mild thickening of the aortic valve. Aortic valve regurgitation is mild to moderate. Aortic valve sclerosis is present, with no evidence of aortic valve stenosis. Aortic valve mean gradient measures 5.0 mmHg. Aortic valve peak gradient measures 9.9 mmHg. Aortic valve area, by VTI measures 1.59 cm. Pulmonic Valve: The pulmonic valve was normal in structure. Pulmonic valve regurgitation is not visualized. No evidence of pulmonic stenosis. Aorta: The aortic root is normal in size and structure. Venous: The inferior  vena cava is normal in size with less than 50% respiratory variability, suggesting right atrial pressure of 8 mmHg. IAS/Shunts: No atrial level shunt detected by color flow Doppler.  LEFT VENTRICLE PLAX 2D LVIDd:         4.70 cm   Diastology LVIDs:         3.60 cm   LV e' medial:    4.93 cm/s LV PW:         1.10 cm   LV E/e' medial:  20.9 LV IVS:        1.10 cm   LV e' lateral:   6.46 cm/s LVOT diam:     1.80 cm   LV E/e' lateral: 15.9 LV SV:         39 LV SV Index:   21 LVOT Area:     2.54 cm  RIGHT VENTRICLE  IVC RV Basal diam:  3.60 cm    IVC diam: 2.00 cm RV Mid diam:    3.60 cm RV S prime:     9.73 cm/s TAPSE (M-mode): 1.4 cm LEFT ATRIUM             Index LA diam:        4.70 cm 2.58 cm/m LA Vol (A2C):   79.6 ml 43.77 ml/m LA Vol (A4C):   60.6 ml 33.35 ml/m LA Biplane Vol: 80.3 ml 44.15 ml/m  AORTIC VALVE                     PULMONIC VALVE AV Area (Vmax):    1.70 cm      PV Vmax:       0.91 m/s AV Area (Vmean):   1.68 cm      PV Peak grad:  3.3 mmHg AV Area (VTI):     1.59 cm AV Vmax:           157.00 cm/s AV Vmean:          101.000 cm/s AV VTI:            0.243 m AV Peak Grad:      9.9 mmHg AV Mean Grad:      5.0 mmHg LVOT Vmax:         105.00 cm/s LVOT Vmean:        66.800 cm/s LVOT VTI:          0.152 m LVOT/AV VTI ratio: 0.63 AR Vena Contracta: 0.50 cm  AORTA Ao Root diam: 2.80 cm Ao Asc diam:  3.10 cm MITRAL VALVE                TRICUSPID VALVE MV Area (PHT): 5.84 cm     TR Peak grad:   36.5 mmHg MV Decel Time: 130 msec     TR Vmax:        302.00 cm/s MV E velocity: 103.00 cm/s MV A velocity: 96.80 cm/s   SHUNTS MV E/A ratio:  1.06         Systemic VTI:  0.15 m                             Systemic Diam: 1.80 cm Charlton Haws MD Electronically signed by Charlton Haws MD Signature Date/Time: 05/03/2023/3:18:16 PM    Final    US Venous Img Lower Bilateral (DVT)  Result Date: 05/03/2023 CLINICAL DATA:  Pulmonary embolism and lower extremity edema. EXAM: BILATERAL LOWER EXTREMITY VENOUS  DOPPLER ULTRASOUND TECHNIQUE: Gray-scale sonography with graded compression, as well as color Doppler and duplex ultrasound were performed to evaluate the lower extremity deep venous systems from the level of the common femoral vein and including the common femoral, femoral, profunda femoral, popliteal and calf veins including the posterior tibial, peroneal and gastrocnemius veins when visible. The superficial great saphenous vein was also interrogated. Spectral Doppler was utilized to evaluate flow at rest and with distal augmentation maneuvers in the common femoral, femoral and popliteal veins. COMPARISON:  None Available. FINDINGS: RIGHT LOWER EXTREMITY Common Femoral Vein: No evidence of thrombus. Normal compressibility, respiratory phasicity and response to augmentation. Saphenofemoral Junction: No evidence of thrombus. Normal compressibility and flow on color Doppler imaging. Profunda Femoral Vein: No evidence of thrombus. Normal compressibility and flow on color Doppler imaging. Femoral Vein: No evidence of thrombus. Normal compressibility, respiratory phasicity and response to augmentation. Popliteal Vein:  No evidence of thrombus. Normal compressibility, respiratory phasicity and response to augmentation. Calf Veins: No evidence of thrombus. Normal compressibility and flow on color Doppler imaging. Superficial Great Saphenous Vein: No evidence of thrombus. Normal compressibility. Venous Reflux:  None. Other Findings: No evidence of superficial thrombophlebitis or abnormal fluid collection. LEFT LOWER EXTREMITY Common Femoral Vein: No evidence of thrombus. Normal compressibility, respiratory phasicity and response to augmentation. Saphenofemoral Junction: No evidence of thrombus. Normal compressibility and flow on color Doppler imaging. Profunda Femoral Vein: No evidence of thrombus. Normal compressibility and flow on color Doppler imaging. Femoral Vein: No evidence of thrombus. Normal compressibility,  respiratory phasicity and response to augmentation. Popliteal Vein: No evidence of thrombus. Normal compressibility, respiratory phasicity and response to augmentation. Calf Veins: No evidence of thrombus. Normal compressibility and flow on color Doppler imaging. Superficial Great Saphenous Vein: No evidence of thrombus. Normal compressibility. Venous Reflux:  None. Other Findings: No evidence of superficial thrombophlebitis or abnormal fluid collection. IMPRESSION: No evidence of deep venous thrombosis in either lower extremity. Electronically Signed   By: Irish Lack M.D.   On: 05/03/2023 10:40   CT Angio Chest PE W and/or Wo Contrast  Addendum Date: 05/02/2023   ADDENDUM REPORT: 05/02/2023 20:23 ADDENDUM: Critical Value/emergent results were called by telephone at the time of interpretation on 05/02/2023 at 8:20 pm to provider [facility] , who verbally acknowledged these results. Electronically Signed   By: Obie Dredge M.D.   On: 05/02/2023 20:23   Result Date: 05/02/2023 CLINICAL DATA:  Shortness of breath for the past 2 weeks. EXAM: CT ANGIOGRAPHY CHEST WITH CONTRAST TECHNIQUE: Multidetector CT imaging of the chest was performed using the standard protocol during bolus administration of intravenous contrast. Multiplanar CT image reconstructions and MIPs were obtained to evaluate the vascular anatomy. RADIATION DOSE REDUCTION: This exam was performed according to the departmental dose-optimization program which includes automated exposure control, adjustment of the mA and/or kV according to patient size and/or use of iterative reconstruction technique. CONTRAST:  75mL OMNIPAQUE IOHEXOL 350 MG/ML SOLN COMPARISON:  Chest x-ray from same day. FINDINGS: Cardiovascular: Satisfactory opacification of the pulmonary arteries to the segmental level. Small acute segmental and subsegmental pulmonary emboli in the left upper lobe (series 8, image 33). Moderate cardiomegaly. Reflux of contrast into the IVC  and hepatic veins. No pericardial effusion. No thoracic aortic aneurysm. Coronary, aortic arch, and branch vessel atherosclerotic vascular disease. Mediastinum/Nodes: Prominent mediastinal and right hilar lymph nodes measuring up to 1 cm in short axis, likely reactive. No enlarged axillary lymph nodes. Thyroid gland, trachea, and esophagus demonstrate no significant findings. Lungs/Pleura: Trace bilateral pleural effusions, greater on the right. Scattered mild smooth interlobular septal thickening. No consolidation or pneumothorax. Mild paraseptal emphysema. Upper Abdomen: No acute abnormality.  Multiple hepatic simple cysts. Musculoskeletal: No chest wall abnormality. No acute or significant osseous findings. Scoliosis. Chronic T12 compression deformity. Review of the MIP images confirms the above findings. IMPRESSION: 1. Small acute segmental and subsegmental pulmonary emboli in the left upper lobe. 2. Moderate cardiomegaly with mild interstitial pulmonary edema and trace bilateral pleural effusions. 3. Aortic Atherosclerosis (ICD10-I70.0) and Emphysema (ICD10-J43.9). Electronically Signed: By: Obie Dredge M.D. On: 05/02/2023 20:13   DG Chest 2 View  Result Date: 05/02/2023 CLINICAL DATA:  Short of breath for 2 weeks, fluid overload, tachypnea EXAM: CHEST - 2 VIEW COMPARISON:  03/26/2023 FINDINGS: Frontal and lateral views of the chest demonstrates stable enlargement of the cardiac silhouette. There is stable central vascular congestion and interstitial prominence compatible  with mild congestive heart failure. No effusion or pneumothorax. No acute bony abnormalities. Stable left convex scoliosis centered at the thoracolumbar junction. IMPRESSION: 1. Mild congestive heart failure. No significant change in volume status. Electronically Signed   By: Sharlet Salina M.D.   On: 05/02/2023 14:59    Today   Subjective    Tamara Santiago today has no new complaints No fever  Or chills   No Nausea,  Vomiting or Diarrhea    Patient has been seen and examined prior to discharge   Objective   Blood pressure (!) 141/73, pulse 90, temperature 97.7 F (36.5 C), temperature source Oral, resp. rate 16, height 5\' 4"  (1.626 m), weight 73.4 kg, SpO2 100 %.   Intake/Output Summary (Last 24 hours) at 05/06/2023 1312 Last data filed at 05/05/2023 1700 Gross per 24 hour  Intake 575.94 ml  Output 500 ml  Net 75.94 ml    Exam  Gen:- Awake Alert,  in no apparent distress , no conversational dyspnea HEENT:- [place].AT, No sclera icterus Neck-Supple Neck,No JVD,.  Lungs--improved air movement, no wheezing  CV- S1, S2 normal, irregular , 3/6 SM Abd-  +ve B.Sounds, Abd Soft, No tenderness,    Extremity/Skin:- No  edema, pedal pulses present  Psych-affect is appropriate, oriented x3 Neuro-generalized weakness, no new focal deficits, no tremors   Data Review   CBC w Diff:  Lab Results  Component Value Date   WBC 7.9 05/06/2023   HGB 15.0 05/06/2023   HCT 46.8 (H) 05/06/2023   PLT 333 05/06/2023   LYMPHOPCT 13 09/23/2018   MONOPCT 6 09/23/2018   EOSPCT 0 09/23/2018   BASOPCT 0 09/23/2018    CMP:  Lab Results  Component Value Date   NA 134 (L) 05/06/2023   NA 143 04/15/2023   K 3.9 05/06/2023   CL 99 05/06/2023   CO2 25 05/06/2023   BUN 21 05/06/2023   BUN 16 04/15/2023   CREATININE 1.01 (H) 05/06/2023   PROT 8.0 05/03/2023   ALBUMIN 3.0 (L) 05/05/2023   BILITOT 1.7 (H) 05/03/2023   ALKPHOS 114 05/03/2023   AST 28 05/03/2023   ALT 29 05/03/2023  .  Total Discharge time is about 33 minutes  Shon Hale M.D on 05/06/2023 at 1:12 PM  Go to [email or web address] -  for contact info  [facility] - Office  [phone]

## 2023-05-06 NOTE — Progress Notes (Signed)
ANTICOAGULATION CONSULT NOTE -  Pharmacy Consult for Heparin Indication: pulmonary embolus  Allergies  Allergen Reactions   Ambien [Zolpidem]     Patient Measurements: Height: 5\' 4"  (162.6 cm) Weight: 73.4 kg (161 lb 12.8 oz) IBW/kg (Calculated) : 54.7 Heparin Dosing Weight: 71.7 kg  Vital Signs: Temp: 97.7 F (36.5 C) (07/01 0419) Temp Source: Oral (07/01 0419) BP: 141/73 (07/01 0419) Pulse Rate: 90 (07/01 0419)  Labs: Recent Labs    05/04/23 0506 05/05/23 0456 05/06/23 0515  HGB 14.7 15.4* 15.0  HCT 46.4* 47.8* 46.8*  PLT 328 362 333  HEPARINUNFRC 0.61 0.60 0.82*  CREATININE 0.95 0.94 1.01*     Estimated Creatinine Clearance: 48.7 mL/min (A) (by C-G formula based on SCr of 1.01 mg/dL (H)).   Medical History: Past Medical History:  Diagnosis Date   Anxiety    Essential hypertension    GERD (gastroesophageal reflux disease)    Hyperlipidemia     Medications:  Medications Prior to Admission  Medication Sig Dispense Refill Last Dose   acetaminophen (TYLENOL) 500 MG tablet Take 500-1,000 mg by mouth every 6 (six) hours as needed for moderate pain.   05/02/2023   albuterol (VENTOLIN HFA) 108 (90 Base) MCG/ACT inhaler Inhale 2 puffs into the lungs every 4 (four) hours as needed for wheezing or shortness of breath.   05/02/2023   aspirin EC 81 MG tablet Take 81 mg by mouth daily.   05/02/2023   cyanocobalamin (VITAMIN B12) 1000 MCG/ML injection Inject 1,000 mcg into the skin every 30 (thirty) days.   Unk   escitalopram (LEXAPRO) 20 MG tablet Take 20 mg by mouth at bedtime.   Past Week   furosemide (LASIX) 20 MG tablet Take 1 tablet (20 mg total) by mouth daily as needed. Take for weight gain over 2-3 lbs in 24 hours (Patient not taking: Reported on 05/03/2023) 45 tablet 3 Not Taking   furosemide (LASIX) 40 MG tablet Take 40 mg by mouth daily.   05/02/2023   imipramine (TOFRANIL) 25 MG tablet Take 25 mg by mouth at bedtime.   Unk   loratadine (CLARITIN) 10 MG tablet  Take 10 mg by mouth daily.   05/02/2023   losartan (COZAAR) 100 MG tablet Take 100 mg by mouth daily.   05/02/2023   metoprolol tartrate (LOPRESSOR) 50 MG tablet Take 50 mg by mouth 2 (two) times daily.   05/02/2023 at 0900   omeprazole (PRILOSEC) 40 MG capsule Take 40 mg by mouth daily.   05/02/2023   potassium chloride (MICRO-K) 10 MEQ CR capsule Take 20 mEq by mouth daily.   05/02/2023   rosuvastatin (CRESTOR) 10 MG tablet Take 10 mg by mouth daily.   05/02/2023   Vitamin D, Cholecalciferol, 1000 units TABS Take 1,000 Units by mouth daily.   05/02/2023    Assessment: 72 yo female with h/o HTN, GERD and HLD presents with 2 week h/o SOA. CT angio positive for pulmonary embolism. CBC stable, PLT stable. No PTA anticoagulation noted.   Heparin level therapeutic at 0.60 while on 1150u/hr. No bleeding noted. CBC WNL  Goal of Therapy:  Heparin level 0.3-0.7 units/ml Monitor platelets by anticoagulation protocol: Yes   Plan:  Decrease heparin infusion to 1050 units/hr.  Heparin and CBC daily Monitor for bleeding Possible transition to DOAC today  Judeth Cornfield, PharmD Clinical Pharmacist 05/06/2023 7:31 AM

## 2023-05-06 NOTE — Discharge Instructions (Addendum)
1)Take Eliquis/Apixaban which is a blood thinner as prescribed , you will take 2 tablets twice a day for a week and then 1 tablet twice a day after that to finish the initial pack then , you will start your next prescription in about 28 days or so after you complete the initial dose pack  2)Avoid ibuprofen/Advil/Aleve/Motrin/Goody Powders/Naproxen/BC powders/Meloxicam/Diclofenac/Indomethacin and other Nonsteroidal anti-inflammatory medications as these will make you more likely to bleed and can cause stomach ulcers, can also cause Kidney problems.  -You may take Tylenol or acetaminophen as needed  3)Very low-salt diet advised, less than 2 g of sodium per day advised  4)Weigh yourself daily, call if you gain more than 3 pounds in 1 day or more than 5 pounds in 1 week as your diuretic medications may need to be adjusted  5)Limit your Fluid  intake to no more than 60 ounces (1.8 Liters) per day  6)Outpatient physical therapy advised--- please use a walker to prevent falls  7) repeat CBC and BMP blood test with primary care physician in about 1 week  8) okay to Stop aspirin while taking Eliquis/apixaban since you do Not have a stent in the heart   Information on my medicine - ELIQUIS (apixaban)  This medication education was reviewed with me or my healthcare representative as part of my discharge preparation.  The pharmacist that spoke with me during my hospital stay was:  Tad Moore, [facility]  Why was Eliquis prescribed for you? Eliquis was prescribed to treat blood clots that may have been found in the veins of your legs (deep vein thrombosis) or in your lungs (pulmonary embolism) and to reduce the risk of them occurring again.  What do You need to know about Eliquis ? The starting dose is 10 mg (two 5 mg tablets) taken TWICE daily for the FIRST SEVEN (7) DAYS, then the dose is reduced to ONE 5 mg tablet taken TWICE daily.  Eliquis may be taken with or without food.   Try to take the  dose about the same time in the morning and in the evening. If you have difficulty swallowing the tablet whole please discuss with your pharmacist how to take the medication safely.  Take Eliquis exactly as prescribed and DO NOT stop taking Eliquis without talking to the doctor who prescribed the medication.  Stopping may increase your risk of developing a new blood clot.  Refill your prescription before you run out.  After discharge, you should have regular check-up appointments with your healthcare provider that is prescribing your Eliquis.    What do you do if you miss a dose? If a dose of ELIQUIS is not taken at the scheduled time, take it as soon as possible on the same day and twice-daily administration should be resumed. The dose should not be doubled to make up for a missed dose.  Important Safety Information A possible side effect of Eliquis is bleeding. You should call your healthcare provider right away if you experience any of the following: Bleeding from an injury or your nose that does not stop. Unusual colored urine (red or dark brown) or unusual colored stools (red or black). Unusual bruising for unknown reasons. A serious fall or if you hit your head (even if there is no bleeding).  Some medicines may interact with Eliquis and might increase your risk of bleeding or clotting while on Eliquis. To help avoid this, consult your healthcare provider or pharmacist prior to using any new prescription or non-prescription  medications, including herbals, vitamins, non-steroidal anti-inflammatory drugs (NSAIDs) and supplements.  This website has more information on Eliquis (apixaban): http://www.eliquis.com/eliquis/home

## 2023-05-15 IMAGING — MR MR HEAD W/O CM
13 of 14 series · 38 of 48 positions shown · non-contrast
Comparison: None.

CLINICAL DATA: Altered mental status and multiple falls.

EXAM:
MRI HEAD WITHOUT CONTRAST
TECHNIQUE: Multiplanar, multiecho pulse sequences of the brain and surrounding
structures were obtained without intravenous contrast.

[Series 5: DWI · axial · 3.0mm · 0.77mm/px · z∈[-67,+74]mm · 4 of 48 slices shown (1 of 6)]
[im 1/48]
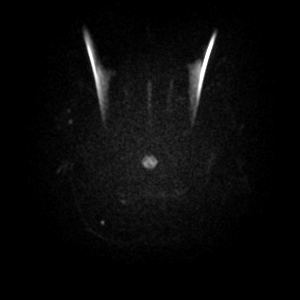
[im 16/48]
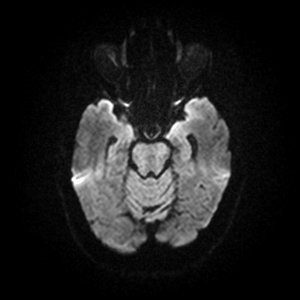
[im 32/48]
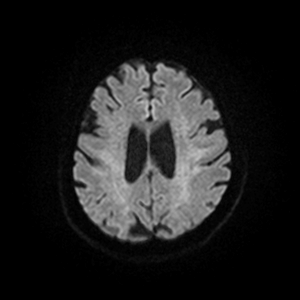
[im 48/48]
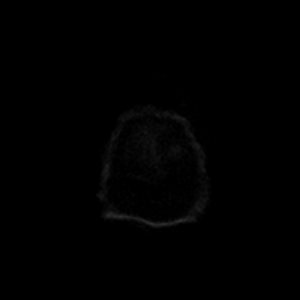

[Series 5: DWI · axial · 3.0mm · 0.77mm/px · z∈[-67,+74]mm · 4 of 48 slices shown (2 of 6)]
[im 1/48]
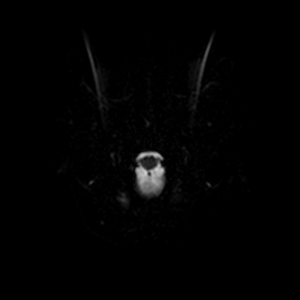
[im 16/48]
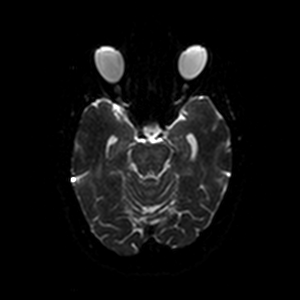
[im 32/48]
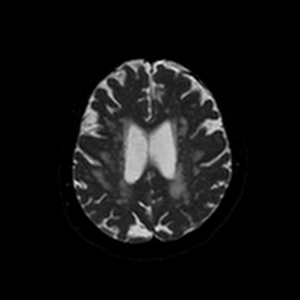
[im 48/48]
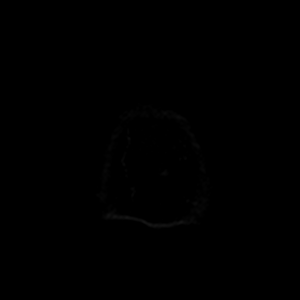

[Series 6: DWI · axial · 3.0mm · 0.77mm/px · z∈[-67,+74]mm · 4 of 48 slices shown (3 of 6)]
[im 1/48]
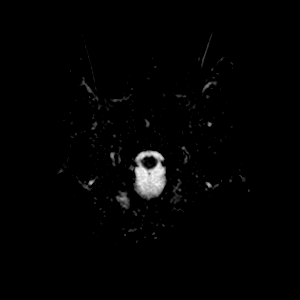
[im 16/48]
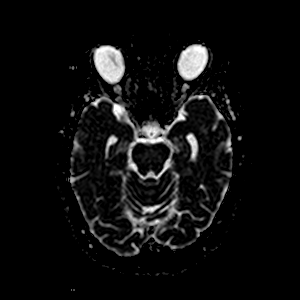
[im 32/48]
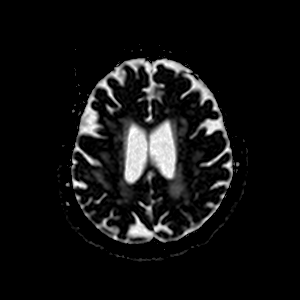
[im 48/48]
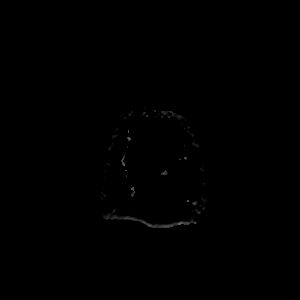

[Series 7: DWI · coronal · 5.0mm · 0.88mm/px · 2 of 28 slices shown (4 of 6)]
[im 1/28]
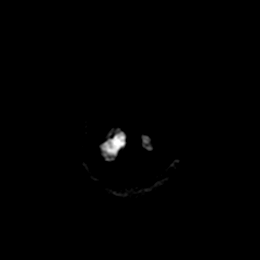
[im 28/28]
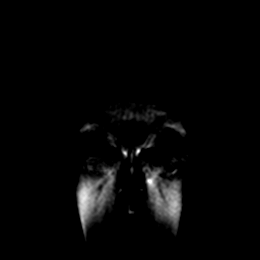

[Series 7: DWI · coronal · 5.0mm · 0.88mm/px · 2 of 28 slices shown (5 of 6)]
[im 1/28]
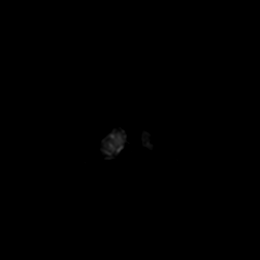
[im 28/28]
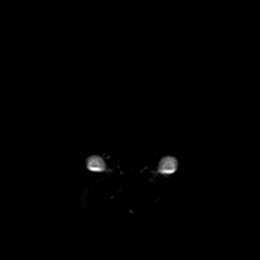

[Series 8: DWI · coronal · 5.0mm · 0.88mm/px · 2 of 28 slices shown (6 of 6)]
[im 1/28]
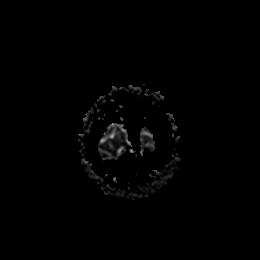
[im 28/28]
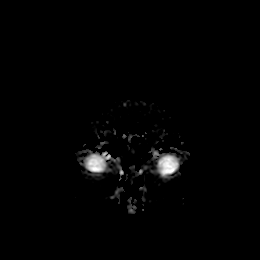

[Series 9: T1 · sagittal · 5.0mm · 0.75mm/px · 1 of 19 slices shown]
[im 1/19]
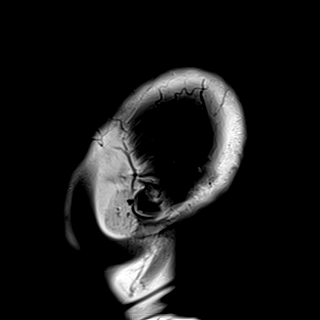

[Series 10: T2 · axial · 5.0mm · 0.72mm/px · z∈[-70,+77]mm · 2 of 22 slices shown (1 of 2)]
[im 1/22]
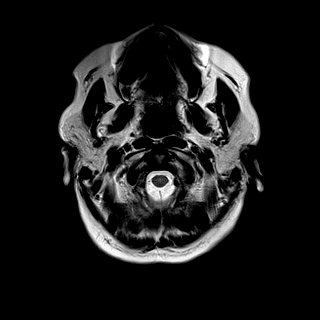
[im 22/22]
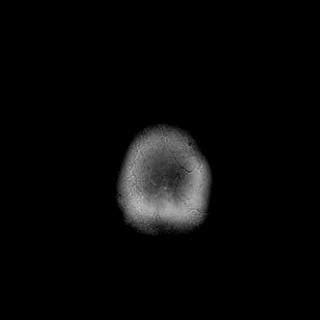

[Series 11: mag_images · axial · 3.0mm · 0.90mm/px · z∈[-84,+93]mm · 4 of 60 slices shown]
[im 1/60]
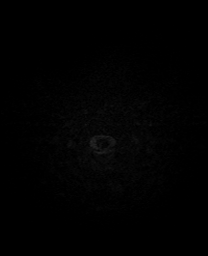
[im 20/60]
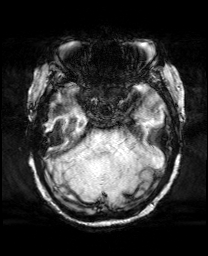
[im 40/60]
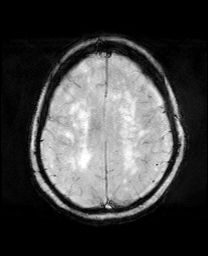
[im 60/60]
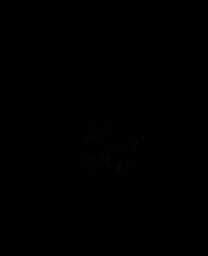

[Series 12: pha_images · axial · 3.0mm · 0.90mm/px · z∈[-81,+93]mm · 4 of 59 slices shown]
[im 1/59]
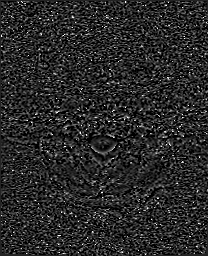
[im 20/59]
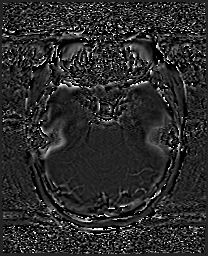
[im 39/59]
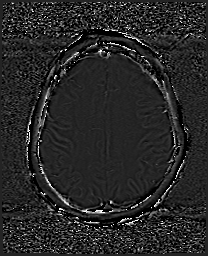
[im 59/59]
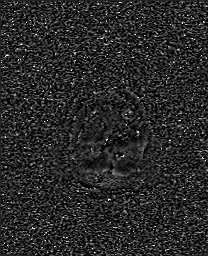

[Series 13: swi_images · axial · 3.0mm · 0.90mm/px · z∈[-84,+93]mm · 4 of 60 slices shown]
[im 1/60]
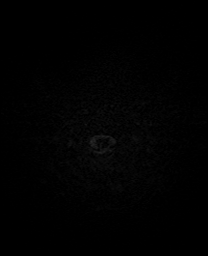
[im 20/60]
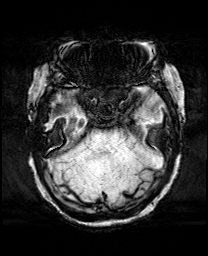
[im 40/60]
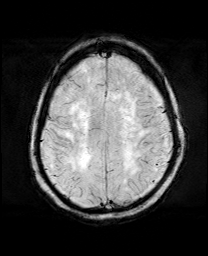
[im 60/60]
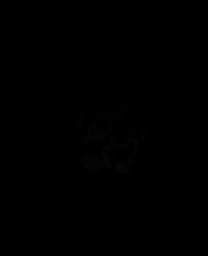

[Series 15: FLAIR · axial · 3.0mm · 0.45mm/px · z∈[-60,+69]mm · 3 of 44 slices shown]
[im 1/44]
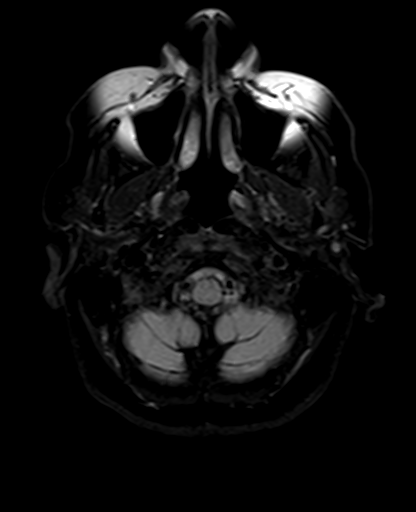
[im 22/44]
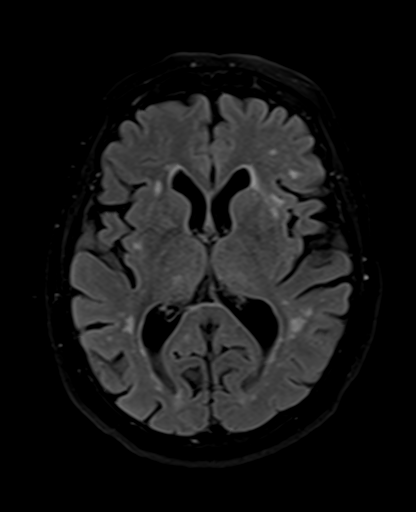
[im 44/44]
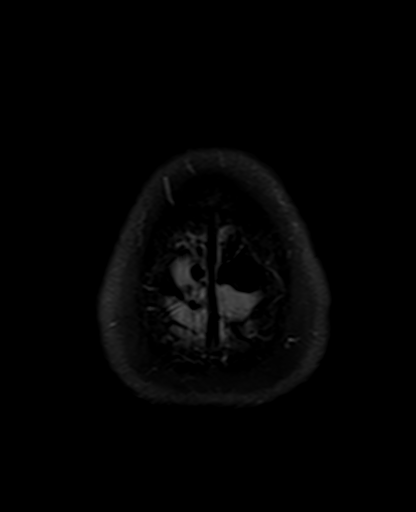

[Series 17: T2 · coronal · 5.0mm · 0.72mm/px · 2 of 26 slices shown (2 of 2)]
[im 1/26]
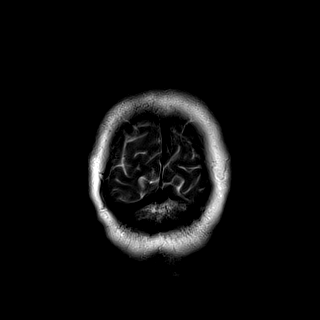
[im 26/26]
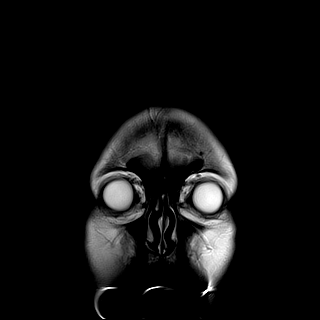

[38 of 48 positions shown; findings below may reference images not displayed]

FINDINGS: Brain: There is no acute infarction or intracranial hemorrhage.
There is no intracranial mass, mass effect, or edema. There is no
hydrocephalus or extra-axial fluid collection. Prominence of the
ventricles and sulci reflects minor parenchymal volume loss. Patchy
and confluent areas of T2 hyperintensity in the supratentorial and
pontine white matter are nonspecific but probably reflect moderate
chronic microvascular ischemic changes.

Vascular: Major vessel flow voids at the skull base are preserved.

Skull and upper cervical spine: Normal marrow signal is preserved.

Sinuses/Orbits: Paranasal sinuses are aerated. Orbits are
unremarkable.

Other: Sella is partially empty.  Mastoid air cells are clear.
IMPRESSION: No evidence of recent infarction, hemorrhage, or mass. Moderate
chronic microvascular ischemic changes.

## 2023-05-16 ENCOUNTER — Ambulatory Visit (HOSPITAL_COMMUNITY): Admission: RE | Admit: 2023-05-16 | Payer: Medicare HMO | Source: Ambulatory Visit

## 2023-05-17 DIAGNOSIS — E871 Hypo-osmolality and hyponatremia: Secondary | ICD-10-CM | POA: Diagnosis not present

## 2023-05-17 DIAGNOSIS — E538 Deficiency of other specified B group vitamins: Secondary | ICD-10-CM | POA: Diagnosis not present

## 2023-05-17 DIAGNOSIS — I10 Essential (primary) hypertension: Secondary | ICD-10-CM | POA: Diagnosis not present

## 2023-05-17 DIAGNOSIS — E1169 Type 2 diabetes mellitus with other specified complication: Secondary | ICD-10-CM | POA: Diagnosis not present

## 2023-05-17 DIAGNOSIS — I110 Hypertensive heart disease with heart failure: Secondary | ICD-10-CM | POA: Diagnosis not present

## 2023-05-17 DIAGNOSIS — E782 Mixed hyperlipidemia: Secondary | ICD-10-CM | POA: Diagnosis not present

## 2023-05-17 DIAGNOSIS — F331 Major depressive disorder, recurrent, moderate: Secondary | ICD-10-CM | POA: Diagnosis not present

## 2023-05-17 DIAGNOSIS — F0390 Unspecified dementia without behavioral disturbance: Secondary | ICD-10-CM | POA: Diagnosis not present

## 2023-05-17 DIAGNOSIS — I5033 Acute on chronic diastolic (congestive) heart failure: Secondary | ICD-10-CM | POA: Diagnosis not present

## 2023-05-17 DIAGNOSIS — E876 Hypokalemia: Secondary | ICD-10-CM | POA: Diagnosis not present

## 2023-05-17 DIAGNOSIS — F17200 Nicotine dependence, unspecified, uncomplicated: Secondary | ICD-10-CM | POA: Diagnosis not present

## 2023-05-17 DIAGNOSIS — K219 Gastro-esophageal reflux disease without esophagitis: Secondary | ICD-10-CM | POA: Diagnosis not present

## 2023-05-31 DIAGNOSIS — R945 Abnormal results of liver function studies: Secondary | ICD-10-CM | POA: Diagnosis not present

## 2023-05-31 DIAGNOSIS — D751 Secondary polycythemia: Secondary | ICD-10-CM | POA: Diagnosis not present

## 2023-06-10 DIAGNOSIS — H903 Sensorineural hearing loss, bilateral: Secondary | ICD-10-CM | POA: Diagnosis not present

## 2023-06-12 ENCOUNTER — Encounter: Payer: Self-pay | Admitting: Cardiology

## 2023-06-12 NOTE — Progress Notes (Signed)
Cardiology Office Note  Date: 06/13/2023   ID: Tamara Santiago, DOB [birth year], MRN [id number]  History of Present Illness: Tamara Santiago is a 71 y.o. female last seen in May by Ms. Hammock NP, reviewed the note.  She is here for a routine visit.  Interval records indicate recent hospitalization with shortness of breath, chest CTA showing acute segmental and subsegmental pulmonary emboli.  She was also felt to have component of HFpEF and treated with diuretic therapy.  Follow-up echocardiogram is noted below.  She states that she feels better in general, NYHA class II dyspnea, no cough or hemoptysis, no chest pain.  She has been weighing herself daily, fluctuates by a pound or 2.  Some ankle edema at times.  I went over her medications and updated the list, reviewed with her daughter.  She is on Lasix 40 mg daily with potassium supplement.  Recent lab work per Dr. Margo Aye as noted below.  We did discuss the possibility of addition of SGLT2 inhibitor.  She does not report any spontaneous bleeding problems on Eliquis.  Physical Exam: VS:  BP 122/76   Pulse 62   Ht 5\' 5"  (1.651 m)   Wt 168 lb (76.2 kg)   SpO2 95%   BMI 27.96 kg/m , BMI Body mass index is 27.96 kg/m.  Wt Readings from Last 3 Encounters:  06/13/23 168 lb (76.2 kg)  05/06/23 161 lb 12.8 oz (73.4 kg)  03/28/23 175 lb 9.6 oz (79.7 kg)    General: Patient appears comfortable at rest. HEENT: Conjunctiva and lids normal. Neck: Supple, no elevated JVP or carotid bruits. Lungs: Clear to auscultation, nonlabored breathing at rest. Cardiac: Regular rate and rhythm, no S3 or significant systolic murmur. Extremities: Trace ankle edema.  ECG:  An ECG dated 02/27/2021 was personally reviewed today and demonstrated:  Sinus rhythm with right bundle branch block.  Labwork: June 2024: Cholesterol 69, triglycerides 74, HDL 20, LDL 33 05/02/2023: B Natriuretic Peptide 2,111.0 05/03/2023: ALT 29; AST 28 05/05/2023: Magnesium  1.8 05/06/2023: BUN 21; Creatinine, Ser 1.01; Hemoglobin 15.0; Platelets 333; Potassium 3.9; Sodium 134   Other Studies Reviewed Today:  Echocardiogram 05/03/2023:  1. Left ventricular ejection fraction, by estimation, is 55%. The left  ventricle has normal function. The left ventricle has no regional wall  motion abnormalities. Left ventricular diastolic parameters are consistent  with Grade II diastolic dysfunction  (pseudonormalization). Elevated left ventricular end-diastolic pressure.   2. Right ventricular systolic function is normal. The right ventricular  size is normal.   3. Left atrial size was moderately dilated.   4. Right atrial size was mildly dilated.   5. The mitral valve is abnormal. Mild mitral valve regurgitation. No  evidence of mitral stenosis.   6. The aortic valve is tricuspid. There is mild calcification of the  aortic valve. There is mild thickening of the aortic valve. Aortic valve  regurgitation is mild to moderate. Aortic valve sclerosis is present, with  no evidence of aortic valve  stenosis.   7. The inferior vena cava is normal in size with <50% respiratory  variability, suggesting right atrial pressure of 8 mmHg.   Assessment and Plan:  1.  HFpEF, LVEF approximately 55% by echocardiogram in June, moderate diastolic dysfunction at that point but normal RV function.  Plan to initiate Jardiance 10 mg daily, decrease Lasix to 20 mg daily, and decrease potassium supplement to 10 mEq daily.  2.  Essential hypertension.  Continue losartan at current dose.  3.  Mixed hyperlipidemia.  She is on Crestor, LDL 33 in June.  4.  Recently diagnosed small acute segmental and subsegmental pulmonary emboli in the left upper lobe by chest CTA in June.  Currently on Eliquis with plan to follow-up with PCP.  Disposition:  Follow up  3 months.  Signed, Jonelle Sidle, M.D., F.A.C.C. [facility]

## 2023-06-13 ENCOUNTER — Encounter: Payer: Self-pay | Admitting: Cardiology

## 2023-06-13 ENCOUNTER — Ambulatory Visit: Payer: Medicare HMO | Attending: Cardiology | Admitting: Cardiology

## 2023-06-13 VITALS — BP 122/76 | HR 62 | Ht 65.0 in | Wt 168.0 lb

## 2023-06-13 DIAGNOSIS — I5032 Chronic diastolic (congestive) heart failure: Secondary | ICD-10-CM

## 2023-06-13 DIAGNOSIS — Z86711 Personal history of pulmonary embolism: Secondary | ICD-10-CM

## 2023-06-13 DIAGNOSIS — E782 Mixed hyperlipidemia: Secondary | ICD-10-CM | POA: Diagnosis not present

## 2023-06-13 DIAGNOSIS — I10 Essential (primary) hypertension: Secondary | ICD-10-CM

## 2023-06-13 MED ORDER — FUROSEMIDE 20 MG PO TABS: 20.0000 mg | ORAL_TABLET | Freq: Every day | ORAL | 1 refills | Status: DC

## 2023-06-13 MED ORDER — EMPAGLIFLOZIN 10 MG PO TABS: 10.0000 mg | ORAL_TABLET | Freq: Every day | ORAL | 6 refills | Status: DC

## 2023-06-13 MED ORDER — POTASSIUM CHLORIDE ER 10 MEQ PO TBCR: 10.0000 meq | EXTENDED_RELEASE_TABLET | Freq: Every day | ORAL | 3 refills | Status: DC

## 2023-06-13 NOTE — Patient Instructions (Addendum)
Medication Instructions:   DECREASE Lasix to 20 mg daily   DECREASE Potassium to 10 meq daily   START Jardiance 10 mg daily   Please bring your medications to next visit   Labwork: None today  Testing/Procedures: None today  Follow-Up: 3 months  Any Other Special Instructions Will Be Listed Below (If Applicable).  If you need a refill on your cardiac medications before your next appointment, please call your pharmacy.

## 2023-06-14 DIAGNOSIS — R944 Abnormal results of kidney function studies: Secondary | ICD-10-CM | POA: Diagnosis not present

## 2023-06-14 DIAGNOSIS — E1165 Type 2 diabetes mellitus with hyperglycemia: Secondary | ICD-10-CM | POA: Diagnosis not present

## 2023-06-14 DIAGNOSIS — R7401 Elevation of levels of liver transaminase levels: Secondary | ICD-10-CM | POA: Diagnosis not present

## 2023-06-14 DIAGNOSIS — E876 Hypokalemia: Secondary | ICD-10-CM | POA: Diagnosis not present

## 2023-06-14 DIAGNOSIS — I5033 Acute on chronic diastolic (congestive) heart failure: Secondary | ICD-10-CM | POA: Diagnosis not present

## 2023-06-14 DIAGNOSIS — I10 Essential (primary) hypertension: Secondary | ICD-10-CM | POA: Diagnosis not present

## 2023-06-14 DIAGNOSIS — E871 Hypo-osmolality and hyponatremia: Secondary | ICD-10-CM | POA: Diagnosis not present

## 2023-06-14 DIAGNOSIS — E1169 Type 2 diabetes mellitus with other specified complication: Secondary | ICD-10-CM | POA: Diagnosis not present

## 2023-06-14 DIAGNOSIS — H903 Sensorineural hearing loss, bilateral: Secondary | ICD-10-CM | POA: Diagnosis not present

## 2023-06-14 DIAGNOSIS — I110 Hypertensive heart disease with heart failure: Secondary | ICD-10-CM | POA: Diagnosis not present

## 2023-06-14 DIAGNOSIS — M7989 Other specified soft tissue disorders: Secondary | ICD-10-CM | POA: Diagnosis not present

## 2023-07-29 DIAGNOSIS — E1169 Type 2 diabetes mellitus with other specified complication: Secondary | ICD-10-CM | POA: Diagnosis not present

## 2023-07-29 DIAGNOSIS — I10 Essential (primary) hypertension: Secondary | ICD-10-CM | POA: Diagnosis not present

## 2023-07-29 DIAGNOSIS — E539 Vitamin B deficiency, unspecified: Secondary | ICD-10-CM | POA: Diagnosis not present

## 2023-08-12 DIAGNOSIS — E876 Hypokalemia: Secondary | ICD-10-CM | POA: Diagnosis not present

## 2023-08-12 DIAGNOSIS — R944 Abnormal results of kidney function studies: Secondary | ICD-10-CM | POA: Diagnosis not present

## 2023-08-12 DIAGNOSIS — K219 Gastro-esophageal reflux disease without esophagitis: Secondary | ICD-10-CM | POA: Diagnosis not present

## 2023-08-12 DIAGNOSIS — I10 Essential (primary) hypertension: Secondary | ICD-10-CM | POA: Diagnosis not present

## 2023-08-12 DIAGNOSIS — Z23 Encounter for immunization: Secondary | ICD-10-CM | POA: Diagnosis not present

## 2023-08-12 DIAGNOSIS — R749 Abnormal serum enzyme level, unspecified: Secondary | ICD-10-CM | POA: Diagnosis not present

## 2023-08-12 DIAGNOSIS — M7989 Other specified soft tissue disorders: Secondary | ICD-10-CM | POA: Diagnosis not present

## 2023-08-12 DIAGNOSIS — E1169 Type 2 diabetes mellitus with other specified complication: Secondary | ICD-10-CM | POA: Diagnosis not present

## 2023-08-12 DIAGNOSIS — I110 Hypertensive heart disease with heart failure: Secondary | ICD-10-CM | POA: Diagnosis not present

## 2023-08-12 DIAGNOSIS — E871 Hypo-osmolality and hyponatremia: Secondary | ICD-10-CM | POA: Diagnosis not present

## 2023-08-12 DIAGNOSIS — I5033 Acute on chronic diastolic (congestive) heart failure: Secondary | ICD-10-CM | POA: Diagnosis not present

## 2023-08-12 DIAGNOSIS — F17210 Nicotine dependence, cigarettes, uncomplicated: Secondary | ICD-10-CM | POA: Diagnosis not present

## 2023-08-27 DIAGNOSIS — E539 Vitamin B deficiency, unspecified: Secondary | ICD-10-CM | POA: Diagnosis not present

## 2023-08-27 DIAGNOSIS — F331 Major depressive disorder, recurrent, moderate: Secondary | ICD-10-CM | POA: Diagnosis not present

## 2023-08-27 DIAGNOSIS — E782 Mixed hyperlipidemia: Secondary | ICD-10-CM | POA: Diagnosis not present

## 2023-08-27 DIAGNOSIS — J309 Allergic rhinitis, unspecified: Secondary | ICD-10-CM | POA: Diagnosis not present

## 2023-08-27 DIAGNOSIS — R0600 Dyspnea, unspecified: Secondary | ICD-10-CM | POA: Diagnosis not present

## 2023-08-27 DIAGNOSIS — I10 Essential (primary) hypertension: Secondary | ICD-10-CM | POA: Diagnosis not present

## 2023-09-25 ENCOUNTER — Encounter: Payer: Self-pay | Admitting: Cardiology

## 2023-09-25 ENCOUNTER — Ambulatory Visit: Payer: Medicare HMO | Attending: Cardiology | Admitting: Cardiology

## 2023-09-25 VITALS — BP 132/64 | HR 82 | Ht 64.0 in | Wt 174.0 lb

## 2023-09-25 DIAGNOSIS — Z79899 Other long term (current) drug therapy: Secondary | ICD-10-CM

## 2023-09-25 DIAGNOSIS — E782 Mixed hyperlipidemia: Secondary | ICD-10-CM | POA: Diagnosis not present

## 2023-09-25 DIAGNOSIS — Z86711 Personal history of pulmonary embolism: Secondary | ICD-10-CM | POA: Diagnosis not present

## 2023-09-25 DIAGNOSIS — I10 Essential (primary) hypertension: Secondary | ICD-10-CM

## 2023-09-25 DIAGNOSIS — I5032 Chronic diastolic (congestive) heart failure: Secondary | ICD-10-CM | POA: Diagnosis not present

## 2023-09-25 MED ORDER — FUROSEMIDE 40 MG PO TABS: ORAL_TABLET | ORAL | 3 refills | Status: DC

## 2023-09-25 MED ORDER — SPIRONOLACTONE 25 MG PO TABS: 12.5000 mg | ORAL_TABLET | Freq: Every day | ORAL | 3 refills | Status: DC

## 2023-09-25 NOTE — Progress Notes (Signed)
Cardiology Office Note  Date: 09/25/2023   ID: Tamara Santiago, DOB Aug 15, 1951, MRN [id number]  History of Present Illness: Tamara Santiago is a 72 y.o. female last seen in August.  She is here with her daughter for follow-up visit.  Complains of fatigue and shortness of breath, not necessarily worse since last encounter.  She also describes left shoulder discomfort with motion that is suggestive of rotator cuff pain.  She has not seen an orthopedist as yet.  I reviewed her medications.  Current regimen includes Eliquis, Jardiance, Lasix, Cozaar, Lopressor, potassium supplement, and Crestor.  She does not report any spontaneous bleeding problems.  Weight is up relative to the last encounter.  She does not report leg swelling but does feel a sense of abdominal distention at times.  Lasix has been 40 mg daily with potassium supplement, she is also on Jardiance.  We discussed addition of Aldactone.  Physical Exam: VS:  BP 132/64   Pulse 82   Ht 5\' 4"  (1.626 m)   Wt 174 lb (78.9 kg)   SpO2 93%   BMI 29.87 kg/m , BMI Body mass index is 29.87 kg/m.  Wt Readings from Last 3 Encounters:  09/25/23 174 lb (78.9 kg)  06/13/23 168 lb (76.2 kg)  05/06/23 161 lb 12.8 oz (73.4 kg)    General: Patient appears comfortable at rest. HEENT: Conjunctiva and lids normal. Neck: Supple, no elevated JVP or carotid bruits. Lungs: Clear to auscultation, nonlabored breathing at rest. Cardiac: Regular rate and rhythm, no S3 or significant systolic murmur. Extremities: No pitting edema.  ECG:  An ECG dated 02/27/2021 was personally reviewed today and demonstrated:  Sinus rhythm with right bundle branch block.  Labwork: 05/02/2023: B Natriuretic Peptide 2,111.0 05/03/2023: ALT 29; AST 28 05/05/2023: Magnesium 1.8 05/06/2023: BUN 21; Creatinine, Ser 1.01; Hemoglobin 15.0; Platelets 333; Potassium 3.9; Sodium 134  September 2024: Hemoglobin 14.4, platelets 339, BUN 14, creatinine 1.22, potassium 5.1, AST 29,  ALT 31, cholesterol 132, glycerides 98, HDL 43, LDL 71, hemoglobin A1c 7.3%  Other Studies Reviewed Today:  Echocardiogram 05/03/2023:  1. Left ventricular ejection fraction, by estimation, is 55%. The left  ventricle has normal function. The left ventricle has no regional wall  motion abnormalities. Left ventricular diastolic parameters are consistent  with Grade II diastolic dysfunction  (pseudonormalization). Elevated left ventricular end-diastolic pressure.   2. Right ventricular systolic function is normal. The right ventricular  size is normal.   3. Left atrial size was moderately dilated.   4. Right atrial size was mildly dilated.   5. The mitral valve is abnormal. Mild mitral valve regurgitation. No  evidence of mitral stenosis.   6. The aortic valve is tricuspid. There is mild calcification of the  aortic valve. There is mild thickening of the aortic valve. Aortic valve  regurgitation is mild to moderate. Aortic valve sclerosis is present, with  no evidence of aortic valve  stenosis.   7. The inferior vena cava is normal in size with <50% respiratory  variability, suggesting right atrial pressure of 8 mmHg.   Assessment and Plan:  1.  HFpEF, LVEF approximately 55% by echocardiogram in June, moderate diastolic dysfunction at that point but normal RV function..  Adding Aldactone 12.5 mg daily.  Otherwise continue Lasix 40 mg daily with potassium supplement, changing prescription so that she has extra to take if weight goes up.  Continue Jardiance.   2.  Primary hypertension.  Adding Aldactone as discussed above, otherwise continue Cozaar and Lopressor.  3.  Mixed hyperlipidemia.  She is on Crestor, LDL 71 in September of this year.   4.  Small acute segmental and subsegmental pulmonary emboli in the left upper lobe by chest CTA in June of this year.  Currently on Eliquis with plan to follow-up with PCP.  Would anticipate a 45-month treatment course.  Disposition:  Follow up  6  months.  Signed, Jonelle Sidle, M.D., F.A.C.C. [facility]

## 2023-09-25 NOTE — Patient Instructions (Signed)
Medication Instructions:   START Aldactone 12.5 mg (1/2 tablet) daily    Take Lasix 40 mg daily. May take an extra 40 mg for weight over 3 lbs in 24 hours  Labwork: [facility] at [facility] in 2 weeks (12/4)  Testing/Procedures: None today  Follow-Up: 6 months  Any Other Special Instructions Will Be Listed Below (If Applicable).  If you need a refill on your cardiac medications before your next appointment, please call your pharmacy.

## 2023-10-08 DIAGNOSIS — E539 Vitamin B deficiency, unspecified: Secondary | ICD-10-CM | POA: Diagnosis not present

## 2023-10-08 DIAGNOSIS — M25512 Pain in left shoulder: Secondary | ICD-10-CM | POA: Diagnosis not present

## 2023-10-09 ENCOUNTER — Telehealth: Payer: Self-pay

## 2023-10-09 ENCOUNTER — Other Ambulatory Visit (HOSPITAL_COMMUNITY)
Admission: RE | Admit: 2023-10-09 | Discharge: 2023-10-09 | Disposition: A | Discharge status: Home / Self Care | Payer: Medicare HMO | Source: Ambulatory Visit | Attending: Cardiology | Admitting: Cardiology

## 2023-10-09 DIAGNOSIS — Z79899 Other long term (current) drug therapy: Secondary | ICD-10-CM | POA: Insufficient documentation

## 2023-10-09 LAB — BASIC METABOLIC PANEL
Anion gap: 9 (ref 5–15)
BUN: 13 mg/dL (ref 8–23)
CO2: 28 mmol/L (ref 22–32)
Calcium: 9.7 mg/dL (ref 8.9–10.3)
Chloride: 99 mmol/L (ref 98–111)
Creatinine, Ser: 1.01 mg/dL — ABNORMAL HIGH (ref 0.44–1.00)
GFR, Estimated: 59 mL/min — ABNORMAL LOW (ref >60)
Glucose, Bld: 210 mg/dL — ABNORMAL HIGH (ref 70–99)
Potassium: 3.3 mmol/L — ABNORMAL LOW (ref 3.5–5.1)
Sodium: 136 mmol/L (ref 135–145)

## 2023-10-09 MED ORDER — POTASSIUM CHLORIDE ER 10 MEQ PO TBCR: 20.0000 meq | EXTENDED_RELEASE_TABLET | Freq: Every day | ORAL | 3 refills | Status: DC

## 2023-10-09 NOTE — Telephone Encounter (Signed)
-----   Message from Nurse Isabelle Course A sent at 10/09/2023  3:50 PM EST -----  ----- Message ----- From: Jonelle Sidle, MD Sent: 10/09/2023   2:42 PM EST To: Eustace Moore, RN  Results reviewed.  No change in creatinine at 1.01.  Potassium mildly reduced at 3.3.  Increase KCl to 20 mEq once daily.

## 2023-10-09 NOTE — Telephone Encounter (Signed)
Lab results discussed with patient. She agrees to increase potassium to 20 meq daily,e-scribed to [facility]

## 2023-10-09 NOTE — Progress Notes (Signed)
Potassium slightly lower. Recommend taking 40 mEq once and then increasing potassium to 20 mEq daily. Repeat BMP in 2 weeks. Kidney function has remained stable with the addition of spironolactone.

## 2023-10-15 ENCOUNTER — Other Ambulatory Visit: Payer: Self-pay

## 2023-10-15 ENCOUNTER — Encounter (HOSPITAL_COMMUNITY): Payer: Self-pay | Admitting: *Deleted

## 2023-10-15 ENCOUNTER — Emergency Department (HOSPITAL_COMMUNITY): Payer: Medicare HMO

## 2023-10-15 ENCOUNTER — Emergency Department (HOSPITAL_COMMUNITY)
Admission: EM | Admit: 2023-10-15 | Discharge: 2023-10-15 | Disposition: A | Discharge status: Home / Self Care | Payer: Medicare HMO | Attending: Emergency Medicine | Admitting: Emergency Medicine

## 2023-10-15 DIAGNOSIS — M25571 Pain in right ankle and joints of right foot: Secondary | ICD-10-CM | POA: Diagnosis not present

## 2023-10-15 DIAGNOSIS — R609 Edema, unspecified: Secondary | ICD-10-CM | POA: Diagnosis not present

## 2023-10-15 DIAGNOSIS — W06XXXA Fall from bed, initial encounter: Secondary | ICD-10-CM | POA: Insufficient documentation

## 2023-10-15 DIAGNOSIS — Z7901 Long term (current) use of anticoagulants: Secondary | ICD-10-CM | POA: Insufficient documentation

## 2023-10-15 DIAGNOSIS — Z79899 Other long term (current) drug therapy: Secondary | ICD-10-CM | POA: Diagnosis not present

## 2023-10-15 DIAGNOSIS — I10 Essential (primary) hypertension: Secondary | ICD-10-CM | POA: Diagnosis not present

## 2023-10-15 DIAGNOSIS — S82891A Other fracture of right lower leg, initial encounter for closed fracture: Secondary | ICD-10-CM | POA: Diagnosis not present

## 2023-10-15 DIAGNOSIS — Z720 Tobacco use: Secondary | ICD-10-CM | POA: Insufficient documentation

## 2023-10-15 DIAGNOSIS — W19XXXA Unspecified fall, initial encounter: Secondary | ICD-10-CM | POA: Diagnosis not present

## 2023-10-15 DIAGNOSIS — M25512 Pain in left shoulder: Secondary | ICD-10-CM | POA: Diagnosis not present

## 2023-10-15 DIAGNOSIS — S99911A Unspecified injury of right ankle, initial encounter: Secondary | ICD-10-CM | POA: Diagnosis not present

## 2023-10-15 DIAGNOSIS — Z743 Need for continuous supervision: Secondary | ICD-10-CM | POA: Diagnosis not present

## 2023-10-15 NOTE — ED Notes (Signed)
Introduced self to pt and brought back to ED room 21 Pt complains of RIGHT ankle pain from fall 2 days ago and LEFT shoulder pain on going for 2 weeks.   Attached to partial monitor  Daughter at bedside

## 2023-10-15 NOTE — ED Triage Notes (Signed)
Pt BIB [facility] for c/o fall that happened Saturday; pt his having right ankle pain  Pt states when she fell she heard and felt a "pop"  Pt also c/o left shoulder pain; pt states she has limited mobility

## 2023-10-15 NOTE — ED Provider Triage Note (Signed)
Emergency Medicine Provider Triage Evaluation Note  Tamara Santiago , a 72 y.o. female  was evaluated in triage.  Pt complains of right ankle, left shoulder pain.  Patient reports fall out of bed this morning.  Reports "sliding out of bed and landing on her bottom.  States that when she was trying to get up, she was holding onto her sheets when they gave way causing her to fall backwards.  States that she rolled her right ankle and landed on her left shoulder.  Denies trauma to head, LOC.  Currently complaining of left shoulder and right ankle pain..  Review of Systems  Positive: See above Negative:   Physical Exam  BP (!) 113/58 (BP Location: Right Arm)   Pulse 74   Temp 98.7 F (37.1 C)   Resp 18   Ht 5\' 4"  (1.626 m)   Wt 78.9 kg   SpO2 100%   BMI 29.86 kg/m  Gen:   Awake, no distress   Resp:  Normal effort  MSK:   Moves extremities without difficulty  Other:  TTP lateral malleolus right ankle.  TTP left shoulder.  Medical Decision Making  Medically screening exam initiated at 3:09 PM.  Appropriate orders placed.  Tamara Santiago was informed that the remainder of the evaluation will be completed by another provider, this initial triage assessment does not replace that evaluation, and the importance of remaining in the ED until their evaluation is complete.     Peter Garter, [place] 10/15/23 1510

## 2023-10-15 NOTE — ED Provider Notes (Signed)
[place] EMERGENCY DEPARTMENT AT [facility] Provider Note   CSN: [id number] Arrival date & time: 10/15/23  1415     History  Chief Complaint  Patient presents with   Ankle Pain    Tiffny Nienaber is a 72 y.o. female.   Ankle Pain   72 year old female presents emergency department after a fall.  Patient states that she slid out of bed this morning when she could not find her footing landing on her bottom.  When trying to stand up, was trying to pull herself up with the sheets when they slid causing her to fall backwards.  States that during this time, inverted her right ankle and landed on her left shoulder.  Has been able to walk but with pain.  Denies any trauma to head, LOC.  Patient is on Eliquis.  Denies any chest pain, shortness of breath, abdominal pain, nausea, vomiting.  Past medical history significant for GERD, hypertension, hyperlipidemia, MDD, PE  Home Medications Prior to Admission medications   Medication Sig Start Date End Date Taking? Authorizing Provider  acetaminophen (TYLENOL) 500 MG tablet Take 500-1,000 mg by mouth every 6 (six) hours as needed for moderate pain.    [provider]  albuterol (VENTOLIN HFA) 108 (90 Base) MCG/ACT inhaler Inhale 2 puffs into the lungs every 4 (four) hours as needed for wheezing or shortness of breath. 05/06/23   Shon Hale, MD  apixaban (ELIQUIS) 5 MG TABS tablet Take 1 tablet (5 mg total) by mouth 2 (two) times daily. Please start this dose in about 28 days or so after you complete the initial dose pack 06/03/23   Shon Hale, MD  cyanocobalamin (VITAMIN B12) 1000 MCG/ML injection Inject 1 mL (1,000 mcg total) into the skin every 30 (thirty) days. 05/06/23   Shon Hale, MD  empagliflozin (JARDIANCE) 10 MG TABS tablet Take 1 tablet (10 mg total) by mouth daily before breakfast. 06/13/23   Jonelle Sidle, MD  escitalopram (LEXAPRO) 20 MG tablet Take 20 mg by mouth at bedtime. 04/15/23   [provider]  furosemide (LASIX) 40 MG tablet Take 40 mg daily. May take an extra 40 mg as needed for weight gain over 3 lbs in 24 hours 09/25/23   Jonelle Sidle, MD  loratadine (CLARITIN) 10 MG tablet Take 10 mg by mouth daily.    [provider]  losartan (COZAAR) 100 MG tablet Take 1 tablet (100 mg total) by mouth daily. 05/06/23   Shon Hale, MD  metoprolol tartrate (LOPRESSOR) 50 MG tablet Take 50 mg by mouth 2 (two) times daily.    [provider]  omeprazole (PRILOSEC) 40 MG capsule Take 40 mg by mouth daily.    [provider]  potassium chloride (KLOR-CON) 10 MEQ tablet Take 2 tablets (20 mEq total) by mouth daily. 10/09/23 01/07/24  Jonelle Sidle, MD  rosuvastatin (CRESTOR) 10 MG tablet Take 1 tablet (10 mg total) by mouth daily. 05/06/23   Shon Hale, MD  spironolactone (ALDACTONE) 25 MG tablet Take 0.5 tablets (12.5 mg total) by mouth daily. 09/25/23 12/24/23  Jonelle Sidle, MD  Vitamin D, Cholecalciferol, 1000 units TABS Take 1,000 Units by mouth daily.    [provider]      Allergies    Ambien [zolpidem]    Review of Systems   Review of Systems  All other systems reviewed and are negative.   Physical Exam Updated Vital Signs BP 128/64   Pulse 79   Temp  98.7 F (37.1 C)   Resp 18   Ht 5\' 4"  (1.626 m)   Wt 78.9 kg   SpO2 98%   BMI 29.86 kg/m  Physical Exam Vitals and nursing note reviewed.  Constitutional:      General: She is not in acute distress.    Appearance: She is well-developed.  HENT:     Head: Normocephalic and atraumatic.  Eyes:     Conjunctiva/sclera: Conjunctivae normal.  Cardiovascular:     Rate and Rhythm: Normal rate and regular rhythm.     Heart sounds: No murmur heard. Pulmonary:     Effort: Pulmonary effort is normal. No respiratory distress.     Breath sounds: Normal breath sounds.  Abdominal:     Palpations: Abdomen is soft.     Tenderness: There is no abdominal tenderness.   Musculoskeletal:        General: No swelling.     Cervical back: Neck supple.     Comments: No midline tenderness of cervical, thoracic, lumbar spine without step-off or deformity.  No chest wall tenderness.  Tender palpation left distal clavicle otherwise, no tenderness of upper extremities.  Patient with anterior and posterior lateral malleolus tenderness of the right foot but otherwise, no tenderness of lower extremities.  Pedal and radial pulses 2+ bilaterally.  Skin:    General: Skin is warm and dry.     Capillary Refill: Capillary refill takes less than 2 seconds.  Neurological:     Mental Status: She is alert.     Comments: Alert and oriented to self, place, time and event.   Speech is fluent, clear without dysarthria or dysphasia.   Strength symmetric in upper/lower extremities   Sensation intact in upper/lower extremities   CN I not tested  CN II not tested CN III, IV, VI PERRLA and EOMs intact bilaterally  CN V Intact sensation to sharp and light touch to the face  CN VII facial movements symmetric  CN VIII not tested  CN IX, X no uvula deviation, symmetric rise of soft palate  CN XI symmetric SCM and trapezius strength bilaterally  CN XII Midline tongue protrusion, symmetric L/R movements     Psychiatric:        Mood and Affect: Mood normal.     ED Results / Procedures / Treatments   Labs (all labs ordered are listed, but only abnormal results are displayed) Labs Reviewed - No data to display  EKG None  Radiology DG Ankle Complete Right  Result Date: 10/15/2023 CLINICAL DATA:  Right ankle pain after fall. EXAM: RIGHT ANKLE - COMPLETE 3+ VIEW COMPARISON:  None Available. FINDINGS: There appears to be a minimally displaced fracture involving the distal right fibula. Overlying soft tissue swelling is noted. No dislocation is noted. IMPRESSION: Probable minimally displaced distal right fibular fracture. Electronically Signed   By: Lupita Raider M.D.   On:  10/15/2023 16:19   DG Shoulder Left  Result Date: 10/15/2023 CLINICAL DATA:  Left shoulder pain after fall. EXAM: LEFT SHOULDER - 2+ VIEW COMPARISON:  None Available. FINDINGS: There is no evidence of fracture or dislocation. There is no evidence of arthropathy or other focal bone abnormality. Soft tissues are unremarkable. IMPRESSION: Negative. Electronically Signed   By: Lupita Raider M.D.   On: 10/15/2023 16:17    Procedures Procedures    Medications Ordered in ED Medications - No data to display  ED Course/ Medical Decision Making/ A&P  Medical Decision Making Amount and/or Complexity of Data Reviewed Radiology: ordered.   This patient presents to the ED for concern of ankle pain, this involves an extensive number of treatment options, and is a complaint that carries with it a high risk of complications and morbidity.  The differential diagnosis includes fracture, strain/pain, dislocation, ligament/tendinous injury, neurovascular plan symptoms arthritis, osteoarthritis, other   Co morbidities that complicate the patient evaluation  See HPI   Additional history obtained:  Additional history obtained from EMR External records from outside source obtained and reviewed including hospital records   Lab Tests:  N/a   Imaging Studies ordered:  I ordered imaging studies including right ankle x-ray, left shoulder x-ray I independently visualized and interpreted imaging which showed  Left shoulder x-ray: No acute osseous abnormality. Right ankle x-ray: Distal right nondisplaced fibular fracture. I agree with the radiologist interpretation   Cardiac Monitoring: / EKG:  The patient was maintained on a cardiac monitor.  I personally viewed and interpreted the cardiac monitored which showed an underlying rhythm of: Sinus rhythm   Consultations Obtained:  N/a   Problem List / ED Course / Critical interventions / Medication  management  Fall, ankle pain Reevaluation of the patient showed that the patient stayed the same I have reviewed the patients home medicines and have made adjustments as needed   Social Determinants of Health:  Chronic cigarette use.  Denies illicit drug use.   Test / Admission - Considered:  Fall, ankle pain Vitals signs within normal range and stable throughout visit. Imaging studies significant for: See above 72 year old female presents emergency department after mechanical fall with complaints of right-sided ankle pain as well as left shoulder pain.  Regarding left shoulder pain, some tenderness distal clavicle mainly pain with ranging left shoulder.  X-ray obtained which is negative for any acute osseous abnormality.  Upon further review, patient has had pain in affected shoulder prior to fall and is being seen with orthopedics regarding it.  Could be underlying rotator cuff pathology..  Regarding medical pain, tenderness lateral malleolus.  X-ray concerning for nondisplaced distal right fibular fracture.  Patient placed in cam walker.  Given crutches to help aid in ambulation.  Recommend follow-up with orthopedics in outpatient setting.  Treatment plan discussed at length with patient and she acknowledged understanding was agreeable to said plan.  Patient overall well-appearing, afebrile in no acute distress. Worrisome signs and symptoms were discussed with the patient, and the patient acknowledged understanding to return to the ED if noticed. Patient was stable upon discharge.          Final Clinical Impression(s) / ED Diagnoses Final diagnoses:  Closed fracture of right ankle, initial encounter    Rx / DC Orders ED Discharge Orders     None         Peter Garter, [place] 10/15/23 1711    Rondel Baton, MD 10/16/23 1057

## 2023-10-15 NOTE — Discharge Instructions (Addendum)
As discussed, you do have evidence of right-sided ankle fracture.  You are able to walk with fracture but will give you boot and crutches to help get around with.  Rest, ice, elevation of the leg above your heart to help with swelling as well as taking medication in the form of Tylenol for treatment of pain.  Recommend follow-up with orthopedics in outpatient setting for reevaluation.  Please do not hesitate to return if the worrisome signs and symptoms we discussed become apparent.

## 2023-10-15 NOTE — ED Notes (Addendum)
Son continued to photograph staff when requested not to Son extremely confrontational  Called security

## 2023-10-21 ENCOUNTER — Encounter: Payer: Self-pay | Admitting: Orthopedic Surgery

## 2023-10-21 ENCOUNTER — Ambulatory Visit (INDEPENDENT_AMBULATORY_CARE_PROVIDER_SITE_OTHER): Payer: Medicare HMO | Admitting: Orthopedic Surgery

## 2023-10-21 VITALS — BP 128/64 | Ht 64.0 in | Wt 173.0 lb

## 2023-10-21 DIAGNOSIS — M7552 Bursitis of left shoulder: Secondary | ICD-10-CM

## 2023-10-21 DIAGNOSIS — M7551 Bursitis of right shoulder: Secondary | ICD-10-CM | POA: Diagnosis not present

## 2023-10-21 DIAGNOSIS — S8261XA Displaced fracture of lateral malleolus of right fibula, initial encounter for closed fracture: Secondary | ICD-10-CM | POA: Diagnosis not present

## 2023-10-21 NOTE — Progress Notes (Signed)
Office Visit Note   Patient: Tamara Santiago           Date of Birth: 11/28/1950           MRN: [id number] Visit Date: 10/21/2023 Requested by: Benita Stabile, MD [address] [place],  [place] [address] PCP: Benita Stabile, MD   Assessment & Plan:   Encounter Diagnoses  Name Primary?   Bursitis of both shoulders Yes   Closed fracture of distal lateral malleolus of right fibula, initial encounter     No orders of the defined types were placed in this encounter.   Bilateral shoulder pain from impingement/bursitis, right fibular fracture  Physical therapy and topical medications secondary to Eliquis use Tylenol for pain 6  6 weeks x-ray right ankle   Subjective: Chief Complaint  Patient presents with   Shoulder Pain    Left ER 10/15/23   Ankle Pain    Right fracture ER     HPI: 72 year old female was scheduled here for left shoulder pain actually complaint of bilateral shoulder pain left greater than right unfortunately fell and injured her right ankle sustained a lateral malleolus fracture nondisplaced comes in for bilateral shoulder pain left worse than right but no history of trauma              ROS: Negative   Images personally read and my interpretation :    Left shoulder removal normal glenohumeral joint no new acute fracture   Visit Diagnoses:  1. Bursitis of both shoulders   2. Closed fracture of distal lateral malleolus of right fibula, initial encounter      Follow-Up Instructions: Return in about 6 weeks (around 12/02/2023) for FOLLOW UP, FRACTURE CARE, RIGHT, ANKLE, XRAYS, SHOULDER, LEFT.    Objective: Vital Signs: BP 128/64 Comment: 10/15/23  Ht 5\' 4"  (1.626 m)   Wt 173 lb (78.5 kg)   BMI 29.70 kg/m   Physical Exam Vitals and nursing note reviewed.  Constitutional:      Appearance: Normal appearance.  HENT:     Head: Normocephalic and atraumatic.  Eyes:     General: No scleral icterus.       Right eye: No discharge.        Left eye:  No discharge.     Extraocular Movements: Extraocular movements intact.     Conjunctiva/sclera: Conjunctivae normal.     Pupils: Pupils are equal, round, and reactive to light.  Cardiovascular:     Rate and Rhythm: Normal rate.     Pulses: Normal pulses.  Skin:    General: Skin is warm and dry.     Capillary Refill: Capillary refill takes less than 2 seconds.  Neurological:     General: No focal deficit present.     Mental Status: She is alert and oriented to person, place, and time.  Psychiatric:        Mood and Affect: Mood normal.        Behavior: Behavior normal.        Thought Content: Thought content normal.        Judgment: Judgment normal.      Right Shoulder Exam   Tenderness  The patient is experiencing no tenderness.  Range of Motion  Active abduction:  abnormal  Passive abduction:  abnormal  Extension:  abnormal  External rotation:  abnormal  Forward flexion:  abnormal   Muscle Strength  Abduction: 5/5  Supraspinatus: 5/5   Tests  Impingement: positive  Other  Erythema:  absent Scars: absent Sensation: normal Pulse: present   Left Shoulder Exam   Tenderness  The patient is experiencing no tenderness.   Range of Motion  Active abduction:  abnormal  Passive abduction:  abnormal  Extension:  abnormal  External rotation:  abnormal  Forward flexion:  abnormal   Muscle Strength  Abduction: 4/5  Supraspinatus: 4/5   Tests  Impingement: positive  Other  Erythema: absent Scars: absent Sensation: normal Pulse: present        Specialty Comments:  No specialty comments available.  Imaging: No results found.   PMFS History: Patient Active Problem List   Diagnosis Date Noted   Pain in joint of left shoulder 10/08/2023   Acute pulmonary embolism (HCC) 05/03/2023   Hypoalbuminemia due to protein-calorie malnutrition (HCC) 05/03/2023   Essential hypertension 05/03/2023   Mixed hyperlipidemia 05/03/2023   GERD (gastroesophageal  reflux disease) 05/03/2023   Asthma, chronic 05/03/2023   CHF (congestive heart failure) (HCC) 05/02/2023   Acute cholecystitis 09/23/2018   Hypokalemia 09/23/2018   Cholangitis 09/23/2018   Sepsis (HCC) 09/23/2018   Past Medical History:  Diagnosis Date   Anxiety    Essential hypertension    GERD (gastroesophageal reflux disease)    Hyperlipidemia    Insomnia    MDD (major depressive disorder)    Nicotine dependence    Snoring     Family History  Problem Relation Age of Onset   Diabetes Mellitus II Mother    Heart disease Mother    Hyperlipidemia Mother    Depression Mother    Diabetes Mother    Hypertension Mother    Hypercholesterolemia Mother     Past Surgical History:  Procedure Laterality Date   CHOLECYSTECTOMY N/A 09/24/2018   Procedure: LAPAROSCOPIC CHOLECYSTECTOMY;  Surgeon: Franky Macho, MD;  Location: [facility];  Service: General;  Laterality: N/A;   TUBAL LIGATION     Social History   Occupational History   Not on file  Tobacco Use   Smoking status: Every Day    Current packs/day: 0.25    Types: Cigarettes   Smokeless tobacco: Never  Vaping Use   Vaping status: Never Used  Substance and Sexual Activity   Alcohol use: No   Drug use: No   Sexual activity: Not on file

## 2023-10-21 NOTE — Patient Instructions (Signed)
Physical therapy has been ordered for you at [facility]. They should call you to schedule, [phone] is the phone number to call, if you want to call to schedule.   

## 2023-11-05 ENCOUNTER — Telehealth (HOSPITAL_COMMUNITY): Payer: Self-pay | Admitting: Occupational Therapy

## 2023-11-05 ENCOUNTER — Ambulatory Visit (HOSPITAL_COMMUNITY): Payer: Medicare HMO | Attending: Occupational Therapy | Admitting: Occupational Therapy

## 2023-11-05 NOTE — Telephone Encounter (Signed)
 Called pt regarding no-show for evaluation today. No answer, left HIPAA compliant voicemail requesting return call to reschedule.   Ezra Sites, OTR/L  [phone] 11/05/23

## 2023-11-29 ENCOUNTER — Other Ambulatory Visit: Payer: Self-pay | Admitting: Cardiology

## 2023-12-02 ENCOUNTER — Other Ambulatory Visit (INDEPENDENT_AMBULATORY_CARE_PROVIDER_SITE_OTHER): Payer: Medicare HMO

## 2023-12-02 ENCOUNTER — Ambulatory Visit: Payer: Medicare HMO | Admitting: Orthopedic Surgery

## 2023-12-02 VITALS — BP 71/53 | HR 60 | Ht 64.0 in | Wt 173.0 lb

## 2023-12-02 DIAGNOSIS — S8261XD Displaced fracture of lateral malleolus of right fibula, subsequent encounter for closed fracture with routine healing: Secondary | ICD-10-CM

## 2023-12-02 DIAGNOSIS — M25512 Pain in left shoulder: Secondary | ICD-10-CM | POA: Diagnosis not present

## 2023-12-02 DIAGNOSIS — G8929 Other chronic pain: Secondary | ICD-10-CM

## 2023-12-02 DIAGNOSIS — M25511 Pain in right shoulder: Secondary | ICD-10-CM | POA: Diagnosis not present

## 2023-12-02 MED ORDER — METHYLPREDNISOLONE ACETATE 40 MG/ML IJ SUSP
40.0000 mg | Freq: Once | INTRAMUSCULAR | Status: AC
  Administered 2023-12-02: 40 mg via INTRA_ARTICULAR

## 2023-12-02 NOTE — Progress Notes (Signed)
   BP (!) 71/53   Pulse 60   Ht 5\' 4"  (1.626 m)   Wt 173 lb (78.5 kg)   BMI 29.70 kg/m   Body mass index is 29.7 kg/m.  Chief Complaint  Patient presents with   Shoulder Pain    Bilateral    Ankle Pain    Right     Encounter Diagnosis  Name Primary?   Closed fracture of distal lateral malleolus of right fibula with routine healing, subsequent encounter 10/15/23 Yes    DOI/DOS/ Date: 10/15/23  Unchanged

## 2023-12-02 NOTE — Progress Notes (Signed)
Patient ID: Tamara Santiago, female   DOB: 03/10/1951, 73 y.o.   MRN: [id number]  Recheck right ankle reevaluate left and right shoulder  Patient with a history of fracture right ankle  Treated with CAM Walker  Doing well with that  Still having pain in her left shoulder decreased range of motion and some weakness.  The right shoulder is also starting to bother her the same way.  Reexamination of the left shoulder shows good strength in abduction and flexion but limited motion to 90 degrees positive impingement sign at 90 degrees  Similar findings on the right  We do not see any contractures in terms of internal/external rotation  Right ankle swelling no tenderness at the fracture site  X-ray of the ankle  DG Ankle Complete Right Result Date: 12/02/2023 Imaging right ankle Avulsion fracture distal fibula As the fracture is healed ankle mortise intact Impression healed lateral malleolus distal fibular avulsion type fracture    Recommend physical therapy and injection left shoulder  Encounter Diagnoses  Name Primary?   Closed fracture of distal lateral malleolus of right fibula with routine healing, subsequent encounter 10/15/23 Yes   Chronic pain of both shoulders     Procedure note the subacromial injection shoulder left   Verbal consent was obtained to inject the  Left   Shoulder  Timeout was completed to confirm the injection site is a subacromial space of the  left  shoulder  Medication used Depo-Medrol 40 mg and lidocaine 1% 3 cc  Anesthesia was provided by ethyl chloride  The injection was performed in the left  posterior subacromial space. After pinning the skin with alcohol and anesthetized the skin with ethyl chloride the subacromial space was injected using a 20-gauge needle. There were no complications  Sterile dressing was applied.

## 2023-12-02 NOTE — Patient Instructions (Addendum)
It is okay to stop wearing the boot, the ankle fracture has healed   Physical therapy has been ordered for you at [facility]. They should call you to schedule, [phone] is the phone number to call, if you want to call to schedule.

## 2023-12-09 DIAGNOSIS — I10 Essential (primary) hypertension: Secondary | ICD-10-CM | POA: Diagnosis not present

## 2023-12-09 DIAGNOSIS — E1169 Type 2 diabetes mellitus with other specified complication: Secondary | ICD-10-CM | POA: Diagnosis not present

## 2023-12-09 DIAGNOSIS — E538 Deficiency of other specified B group vitamins: Secondary | ICD-10-CM | POA: Diagnosis not present

## 2023-12-10 ENCOUNTER — Other Ambulatory Visit: Payer: Self-pay

## 2023-12-10 ENCOUNTER — Encounter (HOSPITAL_COMMUNITY): Payer: Self-pay | Admitting: Occupational Therapy

## 2023-12-10 ENCOUNTER — Ambulatory Visit (HOSPITAL_COMMUNITY): Payer: Medicare HMO | Attending: Orthopedic Surgery | Admitting: Occupational Therapy

## 2023-12-10 DIAGNOSIS — M25611 Stiffness of right shoulder, not elsewhere classified: Secondary | ICD-10-CM

## 2023-12-10 DIAGNOSIS — R29898 Other symptoms and signs involving the musculoskeletal system: Secondary | ICD-10-CM

## 2023-12-10 DIAGNOSIS — G8929 Other chronic pain: Secondary | ICD-10-CM | POA: Diagnosis not present

## 2023-12-10 DIAGNOSIS — M25612 Stiffness of left shoulder, not elsewhere classified: Secondary | ICD-10-CM | POA: Diagnosis not present

## 2023-12-10 DIAGNOSIS — M25511 Pain in right shoulder: Secondary | ICD-10-CM | POA: Diagnosis not present

## 2023-12-10 DIAGNOSIS — M25512 Pain in left shoulder: Secondary | ICD-10-CM

## 2023-12-10 NOTE — Therapy (Signed)
 OUTPATIENT OCCUPATIONAL THERAPY ORTHO EVALUATION  Patient Name: Tamara Santiago MRN: [id number] DOB:11/30/1950, 73 y.o., female Today's Date: 12/12/2023   END OF SESSION:  OT End of Session - 12/12/23 0939     Visit Number 1    Number of Visits 8    Date for OT Re-Evaluation 01/17/24    Authorization Type [employer]    OT Start Time 1434    OT Stop Time 1500    OT Time Calculation (min) 26 min    Activity Tolerance Patient tolerated treatment well    Behavior During Therapy WFL for tasks assessed/performed             Past Medical History:  Diagnosis Date   Anxiety    Essential hypertension    GERD (gastroesophageal reflux disease)    Hyperlipidemia    Insomnia    MDD (major depressive disorder)    Nicotine  dependence    Snoring    Past Surgical History:  Procedure Laterality Date   CHOLECYSTECTOMY N/A 09/24/2018   Procedure: LAPAROSCOPIC CHOLECYSTECTOMY;  Surgeon: Mavis Anes, MD;  Location: [facility];  Service: General;  Laterality: N/A;   TUBAL LIGATION     Patient Active Problem List   Diagnosis Date Noted   Pain in joint of left shoulder 10/08/2023   Acute pulmonary embolism (HCC) 05/03/2023   Hypoalbuminemia due to protein-calorie malnutrition (HCC) 05/03/2023   Essential hypertension 05/03/2023   Mixed hyperlipidemia 05/03/2023   GERD (gastroesophageal reflux disease) 05/03/2023   Asthma, chronic 05/03/2023   CHF (congestive heart failure) (HCC) 05/02/2023   Acute cholecystitis 09/23/2018   Hypokalemia 09/23/2018   Cholangitis 09/23/2018   Sepsis (HCC) 09/23/2018    PCP: Shona Rush, MD REFERRING PROVIDER: Margrette Bars, MD  ONSET DATE: ~2 months  REFERRING DIAG: M25.511,G89.29,M25.512 (ICD-10-CM) - Chronic pain of both shoulders   THERAPY DIAG:  Right shoulder pain, unspecified chronicity  Left shoulder pain, unspecified chronicity  Stiffness of left shoulder, not elsewhere classified  Shoulder stiffness, right  Other symptoms and  signs involving the musculoskeletal system  Rationale for Evaluation and Treatment: Rehabilitation  SUBJECTIVE:   SUBJECTIVE STATEMENT: The left is really bad and the right is starting to act up. Pt accompanied by: self  PERTINENT HISTORY: Pt having pain in BUE since December 2024. She initially injured her LUE after a fall, however her RUE is now increasing in pain and limitations now.   PRECAUTIONS: None  WEIGHT BEARING RESTRICTIONS: No  PAIN:  Are you having pain? Yes: NPRS scale: None at rest, 10/10 with movement Pain location: Anterior shoulder girdle Pain description: Sharp and aching Aggravating factors: movement Relieving factors: rest   FALLS: Has patient fallen in last 6 months? Yes. Number of falls 1  PLOF: Independent  PATIENT GOALS: I want to be able to use my arm  NEXT MD VISIT: None  OBJECTIVE:   HAND DOMINANCE: Right  ADLs: Overall ADLs: Pt is unable to dress without assist, unable to wash or style her hair. Pt reports she can't even lift a cup of water to be able to drink with her L hand. Daughter assists with all IADL's at this time due to weakness and pain.   FUNCTIONAL OUTCOME MEASURES: Upper Extremity Functional Scale (UEFS): 50/80 - 62.5%  UPPER EXTREMITY ROM:       Assessed in seated, er/IR adducted  Active ROM Right eval Left eval  Shoulder flexion 107 81  Shoulder abduction 87 64  Shoulder internal rotation 90 90  Shoulder external rotation 31  11  (Blank rows = not tested)    UPPER EXTREMITY MMT:     Assessed in seated, er/IR adducted  MMT Right eval Left eval  Shoulder flexion 4+/5 4/5  Shoulder abduction 4-/5 4-/5  Shoulder internal rotation 4/5 3+/5  Shoulder external rotation 4-/5 3/5  (Blank rows = not tested)  HAND FUNCTION: Grip strength: Right: 44 lbs; Left: 23 lbs  SENSATION: WFL  EDEMA: No swelling noted  OBSERVATIONS: Pt reports intermittent shaking even with  minimal effort   TODAY'S TREATMENT:                                                                                                                               DATE:   12/10/23 -Measurements -Evaluation -Table slides: flexion, abduction, x10 -Pendulums: 2x30   PATIENT EDUCATION: Education details: Table Slides and Pendulums Person educated: Patient Education method: Explanation, Demonstration, and Handouts Education comprehension: verbalized understanding and returned demonstration  HOME EXERCISE PROGRAM: 2/4: Table Slides and Pendulums  GOALS: Goals reviewed with patient? Yes   SHORT TERM GOALS: Target date: 12/27/23  Pt will be provided with and educated on HEP to improve mobility in BUE required for use during ADL completion.   Goal status: INITIAL  2.  Pt will increase BUE P/ROM by 30 degrees to improve ability to use BUE during dressing tasks with minimal compensatory techniques.   Goal status: INITIAL  3.  Pt will increase BUE strength to 4/5 to improve ability to reach for items at waist to chest height during bathing and grooming tasks.   Goal status: INITIAL  LONG TERM GOALS: Target date: 01/17/24  Pt will decrease pain in BUE to 3/10 or less to improve ability to sleep for 2+ consecutive hours without waking due to pain.   Goal status: INITIAL  2.  Pt will decrease BUE fascial restrictions to min amounts or less to improve mobility required for functional reaching tasks.   Goal status: INITIAL  3.  Pt will increase BUE A/ROM by 40 degrees to improve ability to use BUE when reaching overhead or behind back during dressing and bathing tasks.   Goal status: INITIAL  4.  Pt will increase BUE strength to 4+/5 or greater to improve ability to use BUE when lifting or carrying items during meal preparation/housework/yardwork tasks.   Goal status: INITIAL  5.  Pt will return to highest level of function using BUE as dominant/non-dominant during functional task completion.   Goal status:  INITIAL   ASSESSMENT:  CLINICAL IMPRESSION: Patient is a 73 y.o. female who was seen today for occupational therapy evaluation for b/l shoulder pain. Pt presents with increased pain and fascial restrictions, decreased ROM, strength, and functional use of the BUE.   PERFORMANCE DEFICITS: in functional skills including in functional skills including ADLs, IADLs, coordination, tone, ROM, strength, pain, fascial restrictions, muscle spasms, and UE functional use.  IMPAIRMENTS: are limiting patient from ADLs, IADLs, rest and sleep, leisure,  and social participation.   COMORBIDITIES: has no other co-morbidities that affects occupational performance. Patient will benefit from skilled OT to address above impairments and improve overall function.  MODIFICATION OR ASSISTANCE TO COMPLETE EVALUATION: No modification of tasks or assist necessary to complete an evaluation.  OT OCCUPATIONAL PROFILE AND HISTORY: Problem focused assessment: Including review of records relating to presenting problem.  CLINICAL DECISION MAKING: LOW - limited treatment options, no task modification necessary  REHAB POTENTIAL: Good  EVALUATION COMPLEXITY: Low      PLAN:  OT FREQUENCY: 2x/week  OT DURATION: 4 weeks  PLANNED INTERVENTIONS: 97168 OT Re-evaluation, 97535 self care/ADL training, 02889 therapeutic exercise, 97530 therapeutic activity, 97112 neuromuscular re-education, 97140 manual therapy, 97035 ultrasound, 97010 moist heat, 97032 electrical stimulation (manual), passive range of motion, functional mobility training, energy conservation, coping strategies training, patient/family education, and DME and/or AE instructions  RECOMMENDED OTHER SERVICES: N/A  CONSULTED AND AGREED WITH PLAN OF CARE: Patient  PLAN FOR NEXT SESSION: Manual Therapy, P/ROM, AA/ROM, wall wash, table slides   Valentin Nightingale, OTR/L [facility] [phone] Meloney Feld Jillyn Nightingale, OT 12/12/2023, 9:41 AM

## 2023-12-10 NOTE — Patient Instructions (Signed)

## 2023-12-12 ENCOUNTER — Ambulatory Visit (HOSPITAL_COMMUNITY): Payer: Medicare HMO | Admitting: Occupational Therapy

## 2023-12-12 ENCOUNTER — Encounter (HOSPITAL_COMMUNITY): Payer: Self-pay | Admitting: Occupational Therapy

## 2023-12-12 DIAGNOSIS — M25511 Pain in right shoulder: Secondary | ICD-10-CM

## 2023-12-12 DIAGNOSIS — M25612 Stiffness of left shoulder, not elsewhere classified: Secondary | ICD-10-CM

## 2023-12-12 DIAGNOSIS — M25611 Stiffness of right shoulder, not elsewhere classified: Secondary | ICD-10-CM

## 2023-12-12 DIAGNOSIS — G8929 Other chronic pain: Secondary | ICD-10-CM | POA: Diagnosis not present

## 2023-12-12 DIAGNOSIS — M25512 Pain in left shoulder: Secondary | ICD-10-CM

## 2023-12-12 DIAGNOSIS — R29898 Other symptoms and signs involving the musculoskeletal system: Secondary | ICD-10-CM

## 2023-12-12 NOTE — Therapy (Signed)
 OUTPATIENT OCCUPATIONAL THERAPY ORTHO TREATMENT NOTE  Patient Name: Tamara Santiago MRN: [id number] DOB:[birth year], 73 y.o., female Today's Date: 12/13/2023   END OF SESSION:  OT End of Session - 12/12/23 1527     Visit Number 2    Number of Visits 8    Date for OT Re-Evaluation 01/17/24    Authorization Type [employer]    OT Start Time 1527    OT Stop Time 1605    OT Time Calculation (min) 38 min    Activity Tolerance Patient tolerated treatment well    Behavior During Therapy WFL for tasks assessed/performed            Past Medical History:  Diagnosis Date   Anxiety    Essential hypertension    GERD (gastroesophageal reflux disease)    Hyperlipidemia    Insomnia    MDD (major depressive disorder)    Nicotine  dependence    Snoring    Past Surgical History:  Procedure Laterality Date   CHOLECYSTECTOMY N/A 09/24/2018   Procedure: LAPAROSCOPIC CHOLECYSTECTOMY;  Surgeon: Mavis Anes, MD;  Location: [facility];  Service: General;  Laterality: N/A;   TUBAL LIGATION     Patient Active Problem List   Diagnosis Date Noted   Pain in joint of left shoulder 10/08/2023   Acute pulmonary embolism (HCC) 05/03/2023   Hypoalbuminemia due to protein-calorie malnutrition (HCC) 05/03/2023   Essential hypertension 05/03/2023   Mixed hyperlipidemia 05/03/2023   GERD (gastroesophageal reflux disease) 05/03/2023   Asthma, chronic 05/03/2023   CHF (congestive heart failure) (HCC) 05/02/2023   Acute cholecystitis 09/23/2018   Hypokalemia 09/23/2018   Cholangitis 09/23/2018   Sepsis (HCC) 09/23/2018    PCP: Shona Rush, MD REFERRING PROVIDER: Margrette Bars, MD  ONSET DATE: ~2 months  REFERRING DIAG: M25.511,G89.29,M25.512 (ICD-10-CM) - Chronic pain of both shoulders   THERAPY DIAG:  Left shoulder pain, unspecified chronicity  Right shoulder pain, unspecified chronicity  Shoulder stiffness, right  Stiffness of left shoulder, not elsewhere classified  Other symptoms  and signs involving the musculoskeletal system  Rationale for Evaluation and Treatment: Rehabilitation  SUBJECTIVE:   SUBJECTIVE STATEMENT: It feels better when you relax Pt accompanied by: self  PERTINENT HISTORY: Pt having pain in BUE since December 2024. She initially injured her LUE after a fall, however her RUE is now increasing in pain and limitations now.   PRECAUTIONS: None  WEIGHT BEARING RESTRICTIONS: No  PAIN:  Are you having pain? Yes: NPRS scale: 6/10 Pain location: Anterior shoulder girdle Pain description: Sharp and aching Aggravating factors: movement Relieving factors: rest   FALLS: Has patient fallen in last 6 months? Yes. Number of falls 1  PLOF: Independent  PATIENT GOALS: I want to be able to use my arm  NEXT MD VISIT: None  OBJECTIVE:   HAND DOMINANCE: Right  ADLs: Overall ADLs: Pt is unable to dress without assist, unable to wash or style her hair. Pt reports she can't even lift a cup of water to be able to drink with her L hand. Daughter assists with all IADL's at this time due to weakness and pain.   FUNCTIONAL OUTCOME MEASURES: Upper Extremity Functional Scale (UEFS): 50/80 - 62.5%  UPPER EXTREMITY ROM:       Assessed in seated, er/IR adducted  Active ROM Right eval Left eval  Shoulder flexion 107 81  Shoulder abduction 87 64  Shoulder internal rotation 90 90  Shoulder external rotation 31 11  (Blank rows = not tested)    UPPER EXTREMITY  MMT:     Assessed in seated, er/IR adducted  MMT Right eval Left eval  Shoulder flexion 4+/5 4/5  Shoulder abduction 4-/5 4-/5  Shoulder internal rotation 4/5 3+/5  Shoulder external rotation 4-/5 3/5  (Blank rows = not tested)  HAND FUNCTION: Grip strength: Right: 44 lbs; Left: 23 lbs  SENSATION: WFL  EDEMA: No swelling noted  OBSERVATIONS: Pt reports intermittent shaking even with  minimal effort   TODAY'S TREATMENT:                                                                                                                               DATE:   12/12/23 -Manual Therapy: myofascial release and trigger point applied to BUE biceps, trapezius, and scapular region in order to reduce pain and fascial restrictions, as well as improve ROM -P/ROM: BUE, flexion, abduction, horizontal abduction, er/IR, x8 -AA/ROM: BUE, supine, flexion, abduction, protraction, horizontal abduction, er/IR, x10 -Wall Wash: flexion, 2x60 BUE  12/10/23 -Measurements -Evaluation -Table slides: flexion, abduction, x10 -Pendulums: 2x30   PATIENT EDUCATION: Education details: AA/ROM Person educated: Patient Education method: Programmer, Multimedia, Facilities Manager, and Handouts Education comprehension: verbalized understanding and returned demonstration  HOME EXERCISE PROGRAM: 2/4: Table Slides and Pendulums 2/7: AA/ROM  GOALS: Goals reviewed with patient? Yes   SHORT TERM GOALS: Target date: 12/27/23  Pt will be provided with and educated on HEP to improve mobility in BUE required for use during ADL completion.   Goal status: IN PROGRESS  2.  Pt will increase BUE P/ROM by 30 degrees to improve ability to use BUE during dressing tasks with minimal compensatory techniques.   Goal status: IN PROGRESS  3.  Pt will increase BUE strength to 4/5 to improve ability to reach for items at waist to chest height during bathing and grooming tasks.   Goal status: IN PROGRESS  LONG TERM GOALS: Target date: 01/17/24  Pt will decrease pain in BUE to 3/10 or less to improve ability to sleep for 2+ consecutive hours without waking due to pain.   Goal status: IN PROGRESS  2.  Pt will decrease BUE fascial restrictions to min amounts or less to improve mobility required for functional reaching tasks.   Goal status: IN PROGRESS  3.  Pt will increase BUE A/ROM by 40 degrees to improve ability to use BUE when reaching overhead or behind back during dressing and bathing tasks.   Goal status: IN  PROGRESS  4.  Pt will increase BUE strength to 4+/5 or greater to improve ability to use BUE when lifting or carrying items during meal preparation/housework/yardwork tasks.   Goal status: IN PROGRESS  5.  Pt will return to highest level of function using BUE as dominant/non-dominant during functional task completion.   Goal status: IN PROGRESS   ASSESSMENT:  CLINICAL IMPRESSION: Pt presenting to her first OT session, with continued pain in BUE, L>R. With P/ROM, pt was able to tolerate stretching more towards her end ranges  and found that as she relaxed and worked her arms she was able to achieve 70% of full ROM with AA/ROM. With all mobility this session, pt felt that her pain levels improved and she felt like she could move BUE more. Verbal and tactile cuing provided for positioning and technique throughout session.   PERFORMANCE DEFICITS: in functional skills including in functional skills including ADLs, IADLs, coordination, tone, ROM, strength, pain, fascial restrictions, muscle spasms, and UE functional use.   PLAN:  OT FREQUENCY: 2x/week  OT DURATION: 4 weeks  PLANNED INTERVENTIONS: 97168 OT Re-evaluation, 97535 self care/ADL training, 02889 therapeutic exercise, 97530 therapeutic activity, 97112 neuromuscular re-education, 97140 manual therapy, 97035 ultrasound, 97010 moist heat, 97032 electrical stimulation (manual), passive range of motion, functional mobility training, energy conservation, coping strategies training, patient/family education, and DME and/or AE instructions  RECOMMENDED OTHER SERVICES: N/A  CONSULTED AND AGREED WITH PLAN OF CARE: Patient  PLAN FOR NEXT SESSION: Manual Therapy, P/ROM, AA/ROM, wall wash, table slides   Valentin Nightingale, OTR/L [facility] [phone] Lataya Varnell Jillyn Nightingale, OT 12/13/2023, 2:56 PM

## 2023-12-12 NOTE — Patient Instructions (Signed)

## 2023-12-13 DIAGNOSIS — R7401 Elevation of levels of liver transaminase levels: Secondary | ICD-10-CM | POA: Diagnosis not present

## 2023-12-13 DIAGNOSIS — I517 Cardiomegaly: Secondary | ICD-10-CM | POA: Diagnosis not present

## 2023-12-13 DIAGNOSIS — R944 Abnormal results of kidney function studies: Secondary | ICD-10-CM | POA: Diagnosis not present

## 2023-12-13 DIAGNOSIS — M7989 Other specified soft tissue disorders: Secondary | ICD-10-CM | POA: Diagnosis not present

## 2023-12-13 DIAGNOSIS — E875 Hyperkalemia: Secondary | ICD-10-CM | POA: Diagnosis not present

## 2023-12-13 DIAGNOSIS — E785 Hyperlipidemia, unspecified: Secondary | ICD-10-CM | POA: Diagnosis not present

## 2023-12-13 DIAGNOSIS — I10 Essential (primary) hypertension: Secondary | ICD-10-CM | POA: Diagnosis not present

## 2023-12-13 DIAGNOSIS — K219 Gastro-esophageal reflux disease without esophagitis: Secondary | ICD-10-CM | POA: Diagnosis not present

## 2023-12-13 DIAGNOSIS — J90 Pleural effusion, not elsewhere classified: Secondary | ICD-10-CM | POA: Diagnosis not present

## 2023-12-13 DIAGNOSIS — E1165 Type 2 diabetes mellitus with hyperglycemia: Secondary | ICD-10-CM | POA: Diagnosis not present

## 2023-12-13 DIAGNOSIS — R0600 Dyspnea, unspecified: Secondary | ICD-10-CM | POA: Diagnosis not present

## 2023-12-13 DIAGNOSIS — F331 Major depressive disorder, recurrent, moderate: Secondary | ICD-10-CM | POA: Diagnosis not present

## 2023-12-16 ENCOUNTER — Encounter (HOSPITAL_COMMUNITY): Payer: Self-pay | Admitting: Occupational Therapy

## 2023-12-16 ENCOUNTER — Ambulatory Visit (HOSPITAL_COMMUNITY): Payer: Medicare HMO | Admitting: Occupational Therapy

## 2023-12-16 DIAGNOSIS — R29898 Other symptoms and signs involving the musculoskeletal system: Secondary | ICD-10-CM | POA: Diagnosis not present

## 2023-12-16 DIAGNOSIS — M25511 Pain in right shoulder: Secondary | ICD-10-CM | POA: Diagnosis not present

## 2023-12-16 DIAGNOSIS — M25512 Pain in left shoulder: Secondary | ICD-10-CM | POA: Diagnosis not present

## 2023-12-16 DIAGNOSIS — M25611 Stiffness of right shoulder, not elsewhere classified: Secondary | ICD-10-CM | POA: Diagnosis not present

## 2023-12-16 DIAGNOSIS — M25612 Stiffness of left shoulder, not elsewhere classified: Secondary | ICD-10-CM

## 2023-12-16 DIAGNOSIS — G8929 Other chronic pain: Secondary | ICD-10-CM | POA: Diagnosis not present

## 2023-12-16 NOTE — Therapy (Signed)
 OUTPATIENT OCCUPATIONAL THERAPY ORTHO TREATMENT NOTE  Patient Name: Tamara Santiago MRN: [id number] DOB:1950/12/24, 73 y.o., female Today's Date: 12/16/2023   END OF SESSION:  OT End of Session - 12/16/23 1605     Visit Number 3    Number of Visits 8    Date for OT Re-Evaluation 01/17/24    Authorization Type [employer]    OT Start Time 1522    OT Stop Time 1600    OT Time Calculation (min) 38 min    Activity Tolerance Patient tolerated treatment well    Behavior During Therapy WFL for tasks assessed/performed             Past Medical History:  Diagnosis Date   Anxiety    Essential hypertension    GERD (gastroesophageal reflux disease)    Hyperlipidemia    Insomnia    MDD (major depressive disorder)    Nicotine  dependence    Snoring    Past Surgical History:  Procedure Laterality Date   CHOLECYSTECTOMY N/A 09/24/2018   Procedure: LAPAROSCOPIC CHOLECYSTECTOMY;  Surgeon: Alanda Allegra, MD;  Location: [facility];  Service: General;  Laterality: N/A;   TUBAL LIGATION     Patient Active Problem List   Diagnosis Date Noted   Pain in joint of left shoulder 10/08/2023   Acute pulmonary embolism (HCC) 05/03/2023   Hypoalbuminemia due to protein-calorie malnutrition (HCC) 05/03/2023   Essential hypertension 05/03/2023   Mixed hyperlipidemia 05/03/2023   GERD (gastroesophageal reflux disease) 05/03/2023   Asthma, chronic 05/03/2023   CHF (congestive heart failure) (HCC) 05/02/2023   Acute cholecystitis 09/23/2018   Hypokalemia 09/23/2018   Cholangitis 09/23/2018   Sepsis (HCC) 09/23/2018    PCP: Denman Fischer, MD REFERRING PROVIDER: Elsa Halls, MD  ONSET DATE: ~2 months  REFERRING DIAG: M25.511,G89.29,M25.512 (ICD-10-CM) - Chronic pain of both shoulders   THERAPY DIAG:  Left shoulder pain, unspecified chronicity  Right shoulder pain, unspecified chronicity  Shoulder stiffness, right  Stiffness of left shoulder, not elsewhere classified  Other  symptoms and signs involving the musculoskeletal system  Rationale for Evaluation and Treatment: Rehabilitation  SUBJECTIVE:   SUBJECTIVE STATEMENT: "It feels better when you relax" Pt accompanied by: self  PERTINENT HISTORY: Pt having pain in BUE since December 2024. She initially injured her LUE after a fall, however her RUE is now increasing in pain and limitations now.   PRECAUTIONS: None  WEIGHT BEARING RESTRICTIONS: No  PAIN:  Are you having pain? Yes: NPRS scale: 6/10 Pain location: Anterior shoulder girdle Pain description: Sharp and aching Aggravating factors: movement Relieving factors: rest   FALLS: Has patient fallen in last 6 months? Yes. Number of falls 1  PLOF: Independent  PATIENT GOALS: "I want to be able to use my arm"  NEXT MD VISIT: None  OBJECTIVE:   HAND DOMINANCE: Right  ADLs: Overall ADLs: Pt is unable to dress without assist, unable to wash or style her hair. Pt reports she can't even lift a cup of water to be able to drink with her L hand. Daughter assists with all IADL's at this time due to weakness and pain.   FUNCTIONAL OUTCOME MEASURES: Upper Extremity Functional Scale (UEFS): 50/80 - 62.5%  UPPER EXTREMITY ROM:       Assessed in seated, er/IR adducted  Active ROM Right eval Left eval  Shoulder flexion 107 81  Shoulder abduction 87 64  Shoulder internal rotation 90 90  Shoulder external rotation 31 11  (Blank rows = not tested)    UPPER  EXTREMITY MMT:     Assessed in seated, er/IR adducted  MMT Right eval Left eval  Shoulder flexion 4+/5 4/5  Shoulder abduction 4-/5 4-/5  Shoulder internal rotation 4/5 3+/5  Shoulder external rotation 4-/5 3/5  (Blank rows = not tested)  HAND FUNCTION: Grip strength: Right: 44 lbs; Left: 23 lbs  SENSATION: WFL  EDEMA: No swelling noted  OBSERVATIONS: Pt reports intermittent shaking even with  minimal effort   TODAY'S TREATMENT:                                                                                                                               DATE:   12/16/23 -Manual Therapy: myofascial release and trigger point applied to BUE biceps, trapezius, and scapular region in order to reduce pain and fascial restrictions, as well as improve ROM -P/ROM: BUE, flexion, abduction, horizontal abduction, er/IR, x8 -AA/ROM: BUE, supine, flexion, abduction, protraction, horizontal abduction, er/IR, x10 -A/ROM: BUE, supine, flexion, abduction, protraction, horizontal abduction, er/IR, x8 -Proximal Shoulder Exercises: paddles, criss cross, circles both directions, x10 each -UBE: level 1, 2.5' forwards and backwards  12/12/23 -Manual Therapy: myofascial release and trigger point applied to BUE biceps, trapezius, and scapular region in order to reduce pain and fascial restrictions, as well as improve ROM -P/ROM: BUE, flexion, abduction, horizontal abduction, er/IR, x8 -AA/ROM: BUE, supine, flexion, abduction, protraction, horizontal abduction, er/IR, x10 -Wall Wash: flexion, 2x60" BUE  12/10/23 -Measurements -Evaluation -Table slides: flexion, abduction, x10 -Pendulums: 2x30"   PATIENT EDUCATION: Education details: A/ROM Person educated: Patient Education method: Programmer, multimedia, Facilities manager, and Handouts Education comprehension: verbalized understanding and returned demonstration  HOME EXERCISE PROGRAM: 2/4: Table Slides and Pendulums 2/7: AA/ROM 2/10: A/ROM  GOALS: Goals reviewed with patient? Yes   SHORT TERM GOALS: Target date: 12/27/23  Pt will be provided with and educated on HEP to improve mobility in BUE required for use during ADL completion.   Goal status: IN PROGRESS  2.  Pt will increase BUE P/ROM by 30 degrees to improve ability to use BUE during dressing tasks with minimal compensatory techniques.   Goal status: IN PROGRESS  3.  Pt will increase BUE strength to 4/5 to improve ability to reach for items at waist to chest height during bathing  and grooming tasks.   Goal status: IN PROGRESS  LONG TERM GOALS: Target date: 01/17/24  Pt will decrease pain in BUE to 3/10 or less to improve ability to sleep for 2+ consecutive hours without waking due to pain.   Goal status: IN PROGRESS  2.  Pt will decrease BUE fascial restrictions to min amounts or less to improve mobility required for functional reaching tasks.   Goal status: IN PROGRESS  3.  Pt will increase BUE A/ROM by 40 degrees to improve ability to use BUE when reaching overhead or behind back during dressing and bathing tasks.   Goal status: IN PROGRESS  4.  Pt will increase BUE strength to 4+/5 or  greater to improve ability to use BUE when lifting or carrying items during meal preparation/housework/yardwork tasks.   Goal status: IN PROGRESS  5.  Pt will return to highest level of function using BUE as dominant/non-dominant during functional task completion.   Goal status: IN PROGRESS   ASSESSMENT:  CLINICAL IMPRESSION: This session, pt presenting with minimal pain and improved mobility. She was able to tolerate multiple ROM tasks, including active ROM. With A/ROM this session she was able to achieve 75% of full ROM with mild shaking due to weakness/muscle fatigue. Verbal and tactile cuing provided for positioning and technique throughout session.   PERFORMANCE DEFICITS: in functional skills including in functional skills including ADLs, IADLs, coordination, tone, ROM, strength, pain, fascial restrictions, muscle spasms, and UE functional use.   PLAN:  OT FREQUENCY: 2x/week  OT DURATION: 4 weeks  PLANNED INTERVENTIONS: 97168 OT Re-evaluation, 97535 self care/ADL training, 16109 therapeutic exercise, 97530 therapeutic activity, 97112 neuromuscular re-education, 97140 manual therapy, 97035 ultrasound, 97010 moist heat, 97032 electrical stimulation (manual), passive range of motion, functional mobility training, energy conservation, coping strategies training,  patient/family education, and DME and/or AE instructions  RECOMMENDED OTHER SERVICES: N/A  CONSULTED AND AGREED WITH PLAN OF CARE: Patient  PLAN FOR NEXT SESSION: Manual Therapy, P/ROM, AA/ROM, wall wash, table slides   Leticia Raven, OTR/L [facility] [phone] Lynda Capistran Judyann Number, OT 12/16/2023, 4:06 PM

## 2023-12-16 NOTE — Patient Instructions (Signed)

## 2023-12-19 ENCOUNTER — Encounter (HOSPITAL_COMMUNITY): Payer: Medicare HMO | Admitting: Occupational Therapy

## 2023-12-24 ENCOUNTER — Encounter (HOSPITAL_COMMUNITY): Payer: Self-pay | Admitting: Occupational Therapy

## 2023-12-24 ENCOUNTER — Ambulatory Visit (HOSPITAL_COMMUNITY): Payer: Medicare HMO | Admitting: Occupational Therapy

## 2023-12-24 DIAGNOSIS — R29898 Other symptoms and signs involving the musculoskeletal system: Secondary | ICD-10-CM

## 2023-12-24 DIAGNOSIS — M25512 Pain in left shoulder: Secondary | ICD-10-CM | POA: Diagnosis not present

## 2023-12-24 DIAGNOSIS — M25611 Stiffness of right shoulder, not elsewhere classified: Secondary | ICD-10-CM

## 2023-12-24 DIAGNOSIS — M25511 Pain in right shoulder: Secondary | ICD-10-CM | POA: Diagnosis not present

## 2023-12-24 DIAGNOSIS — M25612 Stiffness of left shoulder, not elsewhere classified: Secondary | ICD-10-CM

## 2023-12-24 DIAGNOSIS — G8929 Other chronic pain: Secondary | ICD-10-CM | POA: Diagnosis not present

## 2023-12-24 NOTE — Patient Instructions (Signed)

## 2023-12-24 NOTE — Therapy (Unsigned)
OUTPATIENT OCCUPATIONAL THERAPY ORTHO TREATMENT NOTE  Patient Name: Tamara Santiago MRN: [id number] DOB:[birth year], 73 y.o., female Today's Date: 12/25/2023   END OF SESSION:  OT End of Session - 12/24/23 1602     Visit Number 4    Number of Visits 8    Date for OT Re-Evaluation 01/17/24    Authorization Type [employer]    OT Start Time 1523    OT Stop Time 1602    OT Time Calculation (min) 39 min    Activity Tolerance Patient tolerated treatment well    Behavior During Therapy WFL for tasks assessed/performed             Past Medical History:  Diagnosis Date   Anxiety    Essential hypertension    GERD (gastroesophageal reflux disease)    Hyperlipidemia    Insomnia    MDD (major depressive disorder)    Nicotine dependence    Snoring    Past Surgical History:  Procedure Laterality Date   CHOLECYSTECTOMY N/A 09/24/2018   Procedure: LAPAROSCOPIC CHOLECYSTECTOMY;  Surgeon: Franky Macho, MD;  Location: [facility];  Service: General;  Laterality: N/A;   TUBAL LIGATION     Patient Active Problem List   Diagnosis Date Noted   Pain in joint of left shoulder 10/08/2023   Acute pulmonary embolism (HCC) 05/03/2023   Hypoalbuminemia due to protein-calorie malnutrition (HCC) 05/03/2023   Essential hypertension 05/03/2023   Mixed hyperlipidemia 05/03/2023   GERD (gastroesophageal reflux disease) 05/03/2023   Asthma, chronic 05/03/2023   CHF (congestive heart failure) (HCC) 05/02/2023   Acute cholecystitis 09/23/2018   Hypokalemia 09/23/2018   Cholangitis 09/23/2018   Sepsis (HCC) 09/23/2018    PCP: Nita Sells, MD REFERRING PROVIDER: Fuller Canada, MD  ONSET DATE: ~2 months  REFERRING DIAG: M25.511,G89.29,M25.512 (ICD-10-CM) - Chronic pain of both shoulders   THERAPY DIAG:  Left shoulder pain, unspecified chronicity  Right shoulder pain, unspecified chronicity  Shoulder stiffness, right  Stiffness of left shoulder, not elsewhere classified  Other  symptoms and signs involving the musculoskeletal system  Rationale for Evaluation and Treatment: Rehabilitation  SUBJECTIVE:   SUBJECTIVE STATEMENT: "I think I'm doing better" Pt accompanied by: self  PERTINENT HISTORY: Pt having pain in BUE since December 2024. She initially injured her LUE after a fall, however her RUE is now increasing in pain and limitations now.   PRECAUTIONS: None  WEIGHT BEARING RESTRICTIONS: No  PAIN:  Are you having pain? Yes: NPRS scale: 6/10 Pain location: Anterior shoulder girdle Pain description: Sharp and aching Aggravating factors: movement Relieving factors: rest   FALLS: Has patient fallen in last 6 months? Yes. Number of falls 1  PLOF: Independent  PATIENT GOALS: "I want to be able to use my arm"  NEXT MD VISIT: None  OBJECTIVE:   HAND DOMINANCE: Right  ADLs: Overall ADLs: Pt is unable to dress without assist, unable to wash or style her hair. Pt reports she can't even lift a cup of water to be able to drink with her L hand. Daughter assists with all IADL's at this time due to weakness and pain.   FUNCTIONAL OUTCOME MEASURES: Upper Extremity Functional Scale (UEFS): 50/80 - 62.5%  UPPER EXTREMITY ROM:       Assessed in seated, er/IR adducted  Active ROM Right eval Left eval  Shoulder flexion 107 81  Shoulder abduction 87 64  Shoulder internal rotation 90 90  Shoulder external rotation 31 11  (Blank rows = not tested)    UPPER EXTREMITY  MMT:     Assessed in seated, er/IR adducted  MMT Right eval Left eval  Shoulder flexion 4+/5 4/5  Shoulder abduction 4-/5 4-/5  Shoulder internal rotation 4/5 3+/5  Shoulder external rotation 4-/5 3/5  (Blank rows = not tested)  HAND FUNCTION: Grip strength: Right: 44 lbs; Left: 23 lbs  SENSATION: WFL  EDEMA: No swelling noted  OBSERVATIONS: Pt reports intermittent shaking even with  minimal effort   TODAY'S TREATMENT:                                                                                                                               DATE:   12/25/23 -Manual Therapy: myofascial release and trigger point applied to BUE biceps, trapezius, and scapular region in order to reduce pain and fascial restrictions, as well as improve ROM -AA/ROM: BUE, supine, flexion, abduction, protraction, horizontal abduction, er/IR, x12 -A/ROM: BUE, supine, flexion, abduction, protraction, horizontal abduction, er/IR, x10 -Proximal Shoulder Exercises: paddles, criss cross, circles both directions, x10 each -Scapular Strengthening: red band, extension, retraction, rows, x12 -[facility] on the wall: flexion, x10  12/16/23 -Manual Therapy: myofascial release and trigger point applied to BUE biceps, trapezius, and scapular region in order to reduce pain and fascial restrictions, as well as improve ROM -P/ROM: BUE, flexion, abduction, horizontal abduction, er/IR, x8 -AA/ROM: BUE, supine, flexion, abduction, protraction, horizontal abduction, er/IR, x10 -A/ROM: BUE, supine, flexion, abduction, protraction, horizontal abduction, er/IR, x8 -Proximal Shoulder Exercises: paddles, criss cross, circles both directions, x10 each -UBE: level 1, 2.5' forwards and backwards  12/12/23 -Manual Therapy: myofascial release and trigger point applied to BUE biceps, trapezius, and scapular region in order to reduce pain and fascial restrictions, as well as improve ROM -P/ROM: BUE, flexion, abduction, horizontal abduction, er/IR, x8 -AA/ROM: BUE, supine, flexion, abduction, protraction, horizontal abduction, er/IR, x10 -Wall Wash: flexion, 2x60" BUE   PATIENT EDUCATION: Education details: Publishing rights manager Person educated: Patient Education method: Explanation, Demonstration, and Handouts Education comprehension: verbalized understanding and returned demonstration  HOME EXERCISE PROGRAM: 2/4: Table Slides and Pendulums 2/7: AA/ROM 2/10: A/ROM 2/19: Scapular  Strengthening  GOALS: Goals reviewed with patient? Yes   SHORT TERM GOALS: Target date: 12/27/23  Pt will be provided with and educated on HEP to improve mobility in BUE required for use during ADL completion.   Goal status: IN PROGRESS  2.  Pt will increase BUE P/ROM by 30 degrees to improve ability to use BUE during dressing tasks with minimal compensatory techniques.   Goal status: IN PROGRESS  3.  Pt will increase BUE strength to 4/5 to improve ability to reach for items at waist to chest height during bathing and grooming tasks.   Goal status: IN PROGRESS  LONG TERM GOALS: Target date: 01/17/24  Pt will decrease pain in BUE to 3/10 or less to improve ability to sleep for 2+ consecutive hours without waking due to pain.   Goal status: IN PROGRESS  2.  Pt will  decrease BUE fascial restrictions to min amounts or less to improve mobility required for functional reaching tasks.   Goal status: IN PROGRESS  3.  Pt will increase BUE A/ROM by 40 degrees to improve ability to use BUE when reaching overhead or behind back during dressing and bathing tasks.   Goal status: IN PROGRESS  4.  Pt will increase BUE strength to 4+/5 or greater to improve ability to use BUE when lifting or carrying items during meal preparation/housework/yardwork tasks.   Goal status: IN PROGRESS  5.  Pt will return to highest level of function using BUE as dominant/non-dominant during functional task completion.   Goal status: IN PROGRESS   ASSESSMENT:  CLINICAL IMPRESSION: This session, pt continuing to improve her ROM and strength. She is able to tolerate and achieve full ROM with AA/ROM and 80% of full ROM with A/ROM. Pt was able to start scapular strengthening this session with fair movement pattern. OT providing verbal and tactile cuing provided for positioning and technique throughout session.   PERFORMANCE DEFICITS: in functional skills including in functional skills including ADLs, IADLs,  coordination, tone, ROM, strength, pain, fascial restrictions, muscle spasms, and UE functional use.   PLAN:  OT FREQUENCY: 2x/week  OT DURATION: 4 weeks  PLANNED INTERVENTIONS: 97168 OT Re-evaluation, 97535 self care/ADL training, 16109 therapeutic exercise, 97530 therapeutic activity, 97112 neuromuscular re-education, 97140 manual therapy, 97035 ultrasound, 97010 moist heat, 97032 electrical stimulation (manual), passive range of motion, functional mobility training, energy conservation, coping strategies training, patient/family education, and DME and/or AE instructions  RECOMMENDED OTHER SERVICES: N/A  CONSULTED AND AGREED WITH PLAN OF CARE: Patient  PLAN FOR NEXT SESSION: Manual Therapy, P/ROM, AA/ROM, wall wash, table slides   Trish Mage, OTR/L [facility] [phone] Jerrold Haskell Rosemarie Beath, OT 12/25/2023, 9:08 AM

## 2023-12-26 ENCOUNTER — Encounter (HOSPITAL_COMMUNITY): Payer: Medicare HMO | Admitting: Occupational Therapy

## 2023-12-30 ENCOUNTER — Telehealth (HOSPITAL_COMMUNITY): Payer: Self-pay | Admitting: Occupational Therapy

## 2023-12-30 ENCOUNTER — Encounter (HOSPITAL_COMMUNITY): Payer: Medicare HMO | Admitting: Occupational Therapy

## 2023-12-30 NOTE — Telephone Encounter (Signed)
 OT spoke with [name] regarding No Show on 12/30/23 at 3:15p. Pt forgot about her appointment. OT reminded her of next OT appointment on 01/02/24 at 3:15p, pt agreeable.   Tamara Santiago, OTR/L [facility]

## 2024-01-02 ENCOUNTER — Encounter (HOSPITAL_COMMUNITY): Payer: Self-pay | Admitting: Occupational Therapy

## 2024-01-02 ENCOUNTER — Ambulatory Visit (HOSPITAL_COMMUNITY): Payer: Medicare HMO | Admitting: Occupational Therapy

## 2024-01-02 DIAGNOSIS — G8929 Other chronic pain: Secondary | ICD-10-CM | POA: Diagnosis not present

## 2024-01-02 DIAGNOSIS — M25512 Pain in left shoulder: Secondary | ICD-10-CM

## 2024-01-02 DIAGNOSIS — M25611 Stiffness of right shoulder, not elsewhere classified: Secondary | ICD-10-CM

## 2024-01-02 DIAGNOSIS — M25511 Pain in right shoulder: Secondary | ICD-10-CM

## 2024-01-02 DIAGNOSIS — R29898 Other symptoms and signs involving the musculoskeletal system: Secondary | ICD-10-CM | POA: Diagnosis not present

## 2024-01-02 DIAGNOSIS — M25612 Stiffness of left shoulder, not elsewhere classified: Secondary | ICD-10-CM | POA: Diagnosis not present

## 2024-01-02 NOTE — Therapy (Signed)
 OUTPATIENT OCCUPATIONAL THERAPY ORTHO TREATMENT NOTE  Patient Name: Tamara Santiago MRN: [id number] DOB:1951-09-10, 73 y.o., female Today's Date: 01/02/2024   END OF SESSION:  OT End of Session - 01/02/24 1559     Visit Number 5    Number of Visits 8    Date for OT Re-Evaluation 01/17/24    Authorization Type [employer]    OT Start Time 1521    OT Stop Time 1559    OT Time Calculation (min) 38 min    Activity Tolerance Patient tolerated treatment well    Behavior During Therapy WFL for tasks assessed/performed             Past Medical History:  Diagnosis Date   Anxiety    Essential hypertension    GERD (gastroesophageal reflux disease)    Hyperlipidemia    Insomnia    MDD (major depressive disorder)    Nicotine dependence    Snoring    Past Surgical History:  Procedure Laterality Date   CHOLECYSTECTOMY N/A 09/24/2018   Procedure: LAPAROSCOPIC CHOLECYSTECTOMY;  Surgeon: Franky Macho, MD;  Location: [facility];  Service: General;  Laterality: N/A;   TUBAL LIGATION     Patient Active Problem List   Diagnosis Date Noted   Pain in joint of left shoulder 10/08/2023   Acute pulmonary embolism (HCC) 05/03/2023   Hypoalbuminemia due to protein-calorie malnutrition (HCC) 05/03/2023   Essential hypertension 05/03/2023   Mixed hyperlipidemia 05/03/2023   GERD (gastroesophageal reflux disease) 05/03/2023   Asthma, chronic 05/03/2023   CHF (congestive heart failure) (HCC) 05/02/2023   Acute cholecystitis 09/23/2018   Hypokalemia 09/23/2018   Cholangitis 09/23/2018   Sepsis (HCC) 09/23/2018    PCP: Nita Sells, MD REFERRING PROVIDER: Fuller Canada, MD  ONSET DATE: ~2 months  REFERRING DIAG: M25.511,G89.29,M25.512 (ICD-10-CM) - Chronic pain of both shoulders   THERAPY DIAG:  Left shoulder pain, unspecified chronicity  Right shoulder pain, unspecified chronicity  Shoulder stiffness, right  Stiffness of left shoulder, not elsewhere classified  Other  symptoms and signs involving the musculoskeletal system  Rationale for Evaluation and Treatment: Rehabilitation  SUBJECTIVE:   SUBJECTIVE STATEMENT: "I think I'm doing better" Pt accompanied by: self  PERTINENT HISTORY: Pt having pain in BUE since December 2024. She initially injured her LUE after a fall, however her RUE is now increasing in pain and limitations now.   PRECAUTIONS: None  WEIGHT BEARING RESTRICTIONS: No  PAIN:  Are you having pain? Yes: NPRS scale: 6/10 Pain location: Anterior shoulder girdle Pain description: Sharp and aching Aggravating factors: movement Relieving factors: rest   FALLS: Has patient fallen in last 6 months? Yes. Number of falls 1  PLOF: Independent  PATIENT GOALS: "I want to be able to use my arm"  NEXT MD VISIT: None  OBJECTIVE:   HAND DOMINANCE: Right  ADLs: Overall ADLs: Pt is unable to dress without assist, unable to wash or style her hair. Pt reports she can't even lift a cup of water to be able to drink with her L hand. Daughter assists with all IADL's at this time due to weakness and pain.   FUNCTIONAL OUTCOME MEASURES: Upper Extremity Functional Scale (UEFS): 50/80 - 62.5%  UPPER EXTREMITY ROM:       Assessed in seated, er/IR adducted  Active ROM Right eval Left eval  Shoulder flexion 107 81  Shoulder abduction 87 64  Shoulder internal rotation 90 90  Shoulder external rotation 31 11  (Blank rows = not tested)    UPPER EXTREMITY  MMT:     Assessed in seated, er/IR adducted  MMT Right eval Left eval  Shoulder flexion 4+/5 4/5  Shoulder abduction 4-/5 4-/5  Shoulder internal rotation 4/5 3+/5  Shoulder external rotation 4-/5 3/5  (Blank rows = not tested)  HAND FUNCTION: Grip strength: Right: 44 lbs; Left: 23 lbs  SENSATION: WFL  EDEMA: No swelling noted  OBSERVATIONS: Pt reports intermittent shaking even with  minimal effort   TODAY'S TREATMENT:                                                                                                                               DATE:   01/02/24 -Pulleys: flexion, abduction, x60" each -AA/ROM: BUE, seated, flexion, abduction, protraction, horizontal abduction, er/IR, x12 -A/ROM: BUE, seated, flexion, abduction, protraction, horizontal abduction, er/IR, x10 -Functional Reaching: BUE, 1# in flexion 1st and 2nd shelf x10 each, 1# in abduction, 1st and 2nd shelf x10 each, 2# in flexion 1st and 2nd shelf x10 each -ABC's in the air, green weighted ball, BUE -Scapular Strengthening: red band, extension, retraction, rows, x12 -UBE: level 2, 2.5' forwards and backwards  12/25/23 -Manual Therapy: myofascial release and trigger point applied to BUE biceps, trapezius, and scapular region in order to reduce pain and fascial restrictions, as well as improve ROM -AA/ROM: BUE, supine, flexion, abduction, protraction, horizontal abduction, er/IR, x12 -A/ROM: BUE, supine, flexion, abduction, protraction, horizontal abduction, er/IR, x10 -Proximal Shoulder Exercises: paddles, criss cross, circles both directions, x10 each -Scapular Strengthening: red band, extension, retraction, rows, x12 -[facility] on the wall: flexion, x10  12/16/23 -Manual Therapy: myofascial release and trigger point applied to BUE biceps, trapezius, and scapular region in order to reduce pain and fascial restrictions, as well as improve ROM -P/ROM: BUE, flexion, abduction, horizontal abduction, er/IR, x8 -AA/ROM: BUE, supine, flexion, abduction, protraction, horizontal abduction, er/IR, x10 -A/ROM: BUE, supine, flexion, abduction, protraction, horizontal abduction, er/IR, x8 -Proximal Shoulder Exercises: paddles, criss cross, circles both directions, x10 each -UBE: level 1, 2.5' forwards and backwards   PATIENT EDUCATION: Education details: functional reaching in cabinets at home Person educated: Patient Education method: Explanation, Demonstration, and Handouts Education comprehension:  verbalized understanding and returned demonstration  HOME EXERCISE PROGRAM: 2/4: Table Slides and Pendulums 2/7: AA/ROM 2/10: A/ROM 2/19: Scapular Strengthening 2/27: functional reaching in cabinets at home  GOALS: Goals reviewed with patient? Yes   SHORT TERM GOALS: Target date: 12/27/23  Pt will be provided with and educated on HEP to improve mobility in BUE required for use during ADL completion.   Goal status: IN PROGRESS  2.  Pt will increase BUE P/ROM by 30 degrees to improve ability to use BUE during dressing tasks with minimal compensatory techniques.   Goal status: IN PROGRESS  3.  Pt will increase BUE strength to 4/5 to improve ability to reach for items at waist to chest height during bathing and grooming tasks.   Goal status: IN PROGRESS  LONG TERM GOALS: Target  date: 01/17/24  Pt will decrease pain in BUE to 3/10 or less to improve ability to sleep for 2+ consecutive hours without waking due to pain.   Goal status: IN PROGRESS  2.  Pt will decrease BUE fascial restrictions to min amounts or less to improve mobility required for functional reaching tasks.   Goal status: IN PROGRESS  3.  Pt will increase BUE A/ROM by 40 degrees to improve ability to use BUE when reaching overhead or behind back during dressing and bathing tasks.   Goal status: IN PROGRESS  4.  Pt will increase BUE strength to 4+/5 or greater to improve ability to use BUE when lifting or carrying items during meal preparation/housework/yardwork tasks.   Goal status: IN PROGRESS  5.  Pt will return to highest level of function using BUE as dominant/non-dominant during functional task completion.   Goal status: IN PROGRESS   ASSESSMENT:  CLINICAL IMPRESSION: This session, pt  is able to achieve full A/ROM in sitting. OT was able to progress pt to functional reaching in cabinets with light weights to simulate cooking/meal prep. She fatigued quickly with the functional reaching, however with a  few short rest breaks she was able to complete the whole task. Pt and OT reviewed HEP for good movement pattern, as well as adding functional reaching at home. Verbal and tactile cuing provided for positioning and technique throughout session.   PERFORMANCE DEFICITS: in functional skills including in functional skills including ADLs, IADLs, coordination, tone, ROM, strength, pain, fascial restrictions, muscle spasms, and UE functional use.   PLAN:  OT FREQUENCY: 2x/week  OT DURATION: 4 weeks  PLANNED INTERVENTIONS: 97168 OT Re-evaluation, 97535 self care/ADL training, 16109 therapeutic exercise, 97530 therapeutic activity, 97112 neuromuscular re-education, 97140 manual therapy, 97035 ultrasound, 97010 moist heat, 97032 electrical stimulation (manual), passive range of motion, functional mobility training, energy conservation, coping strategies training, patient/family education, and DME and/or AE instructions  RECOMMENDED OTHER SERVICES: N/A  CONSULTED AND AGREED WITH PLAN OF CARE: Patient  PLAN FOR NEXT SESSION: Manual Therapy PRN, AA/ROM, A/ROM, Scapular strengthening, shoulder strengthening   Trish Mage, OTR/L [facility] [phone] Chanin Frumkin Rosemarie Beath, OT 01/02/2024, 4:00 PM

## 2024-01-06 ENCOUNTER — Ambulatory Visit (HOSPITAL_COMMUNITY): Payer: Medicare HMO | Attending: Orthopedic Surgery | Admitting: Occupational Therapy

## 2024-01-06 ENCOUNTER — Encounter (HOSPITAL_COMMUNITY): Payer: Self-pay | Admitting: Occupational Therapy

## 2024-01-06 DIAGNOSIS — M25512 Pain in left shoulder: Secondary | ICD-10-CM | POA: Diagnosis not present

## 2024-01-06 DIAGNOSIS — M25511 Pain in right shoulder: Secondary | ICD-10-CM | POA: Insufficient documentation

## 2024-01-06 DIAGNOSIS — R29898 Other symptoms and signs involving the musculoskeletal system: Secondary | ICD-10-CM | POA: Insufficient documentation

## 2024-01-06 DIAGNOSIS — M25611 Stiffness of right shoulder, not elsewhere classified: Secondary | ICD-10-CM | POA: Diagnosis not present

## 2024-01-06 DIAGNOSIS — M25612 Stiffness of left shoulder, not elsewhere classified: Secondary | ICD-10-CM | POA: Insufficient documentation

## 2024-01-06 NOTE — Therapy (Unsigned)
 OUTPATIENT OCCUPATIONAL THERAPY ORTHO TREATMENT NOTE  Patient Name: Shaleen Talamantez MRN: [id number] DOB:22-Mar-1951, 73 y.o., female Today's Date: 01/08/2024   END OF SESSION:   01/06/24 1600  OT Visits / Re-Eval  Visit Number 6  Number of Visits 8  Date for OT Re-Evaluation 01/17/24  Authorization  Authorization Type [employer]  OT Time Calculation  OT Start Time 1520  OT Stop Time 1600  OT Time Calculation (min) 40 min  End of Session  Activity Tolerance Patient tolerated treatment well  Behavior During Therapy WFL for tasks assessed/performed     Past Medical History:  Diagnosis Date   Anxiety    Essential hypertension    GERD (gastroesophageal reflux disease)    Hyperlipidemia    Insomnia    MDD (major depressive disorder)    Nicotine dependence    Snoring    Past Surgical History:  Procedure Laterality Date   CHOLECYSTECTOMY N/A 09/24/2018   Procedure: LAPAROSCOPIC CHOLECYSTECTOMY;  Surgeon: Franky Macho, MD;  Location: [facility];  Service: General;  Laterality: N/A;   TUBAL LIGATION     Patient Active Problem List   Diagnosis Date Noted   Pain in joint of left shoulder 10/08/2023   Acute pulmonary embolism (HCC) 05/03/2023   Hypoalbuminemia due to protein-calorie malnutrition (HCC) 05/03/2023   Essential hypertension 05/03/2023   Mixed hyperlipidemia 05/03/2023   GERD (gastroesophageal reflux disease) 05/03/2023   Asthma, chronic 05/03/2023   CHF (congestive heart failure) (HCC) 05/02/2023   Acute cholecystitis 09/23/2018   Hypokalemia 09/23/2018   Cholangitis 09/23/2018   Sepsis (HCC) 09/23/2018    PCP: Nita Sells, MD REFERRING PROVIDER: Fuller Canada, MD  ONSET DATE: ~2 months  REFERRING DIAG: M25.511,G89.29,M25.512 (ICD-10-CM) - Chronic pain of both shoulders   THERAPY DIAG:  Left shoulder pain, unspecified chronicity  Right shoulder pain, unspecified chronicity  Shoulder stiffness, right  Stiffness of left shoulder, not elsewhere  classified  Other symptoms and signs involving the musculoskeletal system  Rationale for Evaluation and Treatment: Rehabilitation  SUBJECTIVE:   SUBJECTIVE STATEMENT: "I think I'm doing better" Pt accompanied by: self  PERTINENT HISTORY: Pt having pain in BUE since December 2024. She initially injured her LUE after a fall, however her RUE is now increasing in pain and limitations now.   PRECAUTIONS: None  WEIGHT BEARING RESTRICTIONS: No  PAIN:  Are you having pain? Yes: NPRS scale: 6/10 Pain location: Anterior shoulder girdle Pain description: Sharp and aching Aggravating factors: movement Relieving factors: rest   FALLS: Has patient fallen in last 6 months? Yes. Number of falls 1  PLOF: Independent  PATIENT GOALS: "I want to be able to use my arm"  NEXT MD VISIT: None  OBJECTIVE:   HAND DOMINANCE: Right  ADLs: Overall ADLs: Pt is unable to dress without assist, unable to wash or style her hair. Pt reports she can't even lift a cup of water to be able to drink with her L hand. Daughter assists with all IADL's at this time due to weakness and pain.   FUNCTIONAL OUTCOME MEASURES: Upper Extremity Functional Scale (UEFS): 50/80 - 62.5%  UPPER EXTREMITY ROM:       Assessed in seated, er/IR adducted  Active ROM Right eval Left eval  Shoulder flexion 107 81  Shoulder abduction 87 64  Shoulder internal rotation 90 90  Shoulder external rotation 31 11  (Blank rows = not tested)    UPPER EXTREMITY MMT:     Assessed in seated, er/IR adducted  MMT Right eval Left eval  Shoulder flexion 4+/5 4/5  Shoulder abduction 4-/5 4-/5  Shoulder internal rotation 4/5 3+/5  Shoulder external rotation 4-/5 3/5  (Blank rows = not tested)  HAND FUNCTION: Grip strength: Right: 44 lbs; Left: 23 lbs  SENSATION: WFL  EDEMA: No swelling noted  OBSERVATIONS: Pt reports intermittent shaking even with  minimal effort   TODAY'S TREATMENT:                                                                                                                               DATE:   01/06/24 -A/ROM: BUE, seated, flexion, abduction, protraction, horizontal abduction, er/IR, x15 -X to V arms, BUE, x15 -Goal Post arms, BUE, x15 -Shoulder Strengthening: BUE, 2#, seated, flexion, abduction, protraction, horizontal abduction, er/IR, x10 -Proximal Shoulder Exercises: paddles, criss cross, circles both directions, x12 each -Theraball Exercises: flexion, overhead press, protraction, V ups, circles both directions, x10 -UBE: level 2, 2.5' forwards and backwards  01/02/24 -Pulleys: flexion, abduction, x60" each -AA/ROM: BUE, seated, flexion, abduction, protraction, horizontal abduction, er/IR, x12 -A/ROM: BUE, seated, flexion, abduction, protraction, horizontal abduction, er/IR, x10 -Functional Reaching: BUE, 1# in flexion 1st and 2nd shelf x10 each, 1# in abduction, 1st and 2nd shelf x10 each, 2# in flexion 1st and 2nd shelf x10 each -ABC's in the air, green weighted ball, BUE -Scapular Strengthening: red band, extension, retraction, rows, x12 -UBE: level 2, 2.5' forwards and backwards  12/25/23 -Manual Therapy: myofascial release and trigger point applied to BUE biceps, trapezius, and scapular region in order to reduce pain and fascial restrictions, as well as improve ROM -AA/ROM: BUE, supine, flexion, abduction, protraction, horizontal abduction, er/IR, x12 -A/ROM: BUE, supine, flexion, abduction, protraction, horizontal abduction, er/IR, x10 -Proximal Shoulder Exercises: paddles, criss cross, circles both directions, x10 each -Scapular Strengthening: red band, extension, retraction, rows, x12 -[facility] on the wall: flexion, x10   PATIENT EDUCATION: Education details: Public relations account executive, w/ dumbbells Person educated: Patient Education method: Explanation, Demonstration, and Handouts Education comprehension: verbalized understanding and returned demonstration  HOME  EXERCISE PROGRAM: 2/4: Table Slides and Pendulums 2/7: AA/ROM 2/10: A/ROM 2/19: Scapular Strengthening 2/27: functional reaching in cabinets at home 3/3: Shoulder strengthening, w/ dumbbells  GOALS: Goals reviewed with patient? Yes   SHORT TERM GOALS: Target date: 12/27/23  Pt will be provided with and educated on HEP to improve mobility in BUE required for use during ADL completion.   Goal status: IN PROGRESS  2.  Pt will increase BUE P/ROM by 30 degrees to improve ability to use BUE during dressing tasks with minimal compensatory techniques.   Goal status: IN PROGRESS  3.  Pt will increase BUE strength to 4/5 to improve ability to reach for items at waist to chest height during bathing and grooming tasks.   Goal status: IN PROGRESS  LONG TERM GOALS: Target date: 01/17/24  Pt will decrease pain in BUE to 3/10 or less to improve ability to sleep for 2+ consecutive hours without waking due  to pain.   Goal status: IN PROGRESS  2.  Pt will decrease BUE fascial restrictions to min amounts or less to improve mobility required for functional reaching tasks.   Goal status: IN PROGRESS  3.  Pt will increase BUE A/ROM by 40 degrees to improve ability to use BUE when reaching overhead or behind back during dressing and bathing tasks.   Goal status: IN PROGRESS  4.  Pt will increase BUE strength to 4+/5 or greater to improve ability to use BUE when lifting or carrying items during meal preparation/housework/yardwork tasks.   Goal status: IN PROGRESS  5.  Pt will return to highest level of function using BUE as dominant/non-dominant during functional task completion.   Goal status: IN PROGRESS   ASSESSMENT:  CLINICAL IMPRESSION: This session, pt continuing to make good progress with OT goals. Her ROM is WFL, her pain is minimal, and her strength/activity tolerance is progressing well. Pt focusing more of each session on strengthening and endurance based exercises/tasks. Verbal  and tactile cuing provided for positioning and technique throughout session.   PERFORMANCE DEFICITS: in functional skills including in functional skills including ADLs, IADLs, coordination, tone, ROM, strength, pain, fascial restrictions, muscle spasms, and UE functional use.   PLAN:  OT FREQUENCY: 2x/week  OT DURATION: 4 weeks  PLANNED INTERVENTIONS: 97168 OT Re-evaluation, 97535 self care/ADL training, 16109 therapeutic exercise, 97530 therapeutic activity, 97112 neuromuscular re-education, 97140 manual therapy, 97035 ultrasound, 97010 moist heat, 97032 electrical stimulation (manual), passive range of motion, functional mobility training, energy conservation, coping strategies training, patient/family education, and DME and/or AE instructions  RECOMMENDED OTHER SERVICES: N/A  CONSULTED AND AGREED WITH PLAN OF CARE: Patient  PLAN FOR NEXT SESSION: Manual Therapy PRN, AA/ROM, A/ROM, Scapular strengthening, shoulder strengthening   Trish Mage, OTR/L [facility] [phone] Giovanny Dugal Rosemarie Beath, OT 01/08/2024, 7:56 AM

## 2024-01-09 ENCOUNTER — Encounter (HOSPITAL_COMMUNITY): Payer: Self-pay | Admitting: Occupational Therapy

## 2024-01-09 ENCOUNTER — Ambulatory Visit (HOSPITAL_COMMUNITY): Payer: Medicare HMO | Admitting: Occupational Therapy

## 2024-01-09 DIAGNOSIS — M25612 Stiffness of left shoulder, not elsewhere classified: Secondary | ICD-10-CM | POA: Diagnosis not present

## 2024-01-09 DIAGNOSIS — R29898 Other symptoms and signs involving the musculoskeletal system: Secondary | ICD-10-CM | POA: Diagnosis not present

## 2024-01-09 DIAGNOSIS — M25512 Pain in left shoulder: Secondary | ICD-10-CM

## 2024-01-09 DIAGNOSIS — M25611 Stiffness of right shoulder, not elsewhere classified: Secondary | ICD-10-CM | POA: Diagnosis not present

## 2024-01-09 DIAGNOSIS — M25511 Pain in right shoulder: Secondary | ICD-10-CM | POA: Diagnosis not present

## 2024-01-09 NOTE — Therapy (Signed)
 OUTPATIENT OCCUPATIONAL THERAPY ORTHO TREATMENT NOTE  Patient Name: Tamara Santiago MRN: [id number] DOB:November 08, 1950, 73 y.o., female Today's Date: 01/10/2024  OCCUPATIONAL THERAPY DISCHARGE SUMMARY  Visits from Start of Care: 7  Current functional level related to goals / functional outcomes: Pt has met 7 out of 8 goals. Her ROM is improving well, she is able to use her arms functionally again with minimal pain.    Remaining deficits: Pt continues to have mildly decreased strength in abduction, unable to achieve 4+/5 strength.    Education / Equipment: Pt provided comprehensive HEP.   Plan: Patient agrees to discharge as she feels she is at a manageable point.      END OF SESSION:  OT End of Session - 01/09/24 1544     Visit Number 7    Number of Visits 8    Date for OT Re-Evaluation 01/17/24    Authorization Type [employer]    OT Start Time 1516    OT Stop Time 1544    OT Time Calculation (min) 28 min    Activity Tolerance Patient tolerated treatment well    Behavior During Therapy WFL for tasks assessed/performed              Past Medical History:  Diagnosis Date   Anxiety    Essential hypertension    GERD (gastroesophageal reflux disease)    Hyperlipidemia    Insomnia    MDD (major depressive disorder)    Nicotine dependence    Snoring    Past Surgical History:  Procedure Laterality Date   CHOLECYSTECTOMY N/A 09/24/2018   Procedure: LAPAROSCOPIC CHOLECYSTECTOMY;  Surgeon: Franky Macho, MD;  Location: [facility];  Service: General;  Laterality: N/A;   TUBAL LIGATION     Patient Active Problem List   Diagnosis Date Noted   Pain in joint of left shoulder 10/08/2023   Acute pulmonary embolism (HCC) 05/03/2023   Hypoalbuminemia due to protein-calorie malnutrition (HCC) 05/03/2023   Essential hypertension 05/03/2023   Mixed hyperlipidemia 05/03/2023   GERD (gastroesophageal reflux disease) 05/03/2023   Asthma, chronic 05/03/2023   CHF (congestive heart  failure) (HCC) 05/02/2023   Acute cholecystitis 09/23/2018   Hypokalemia 09/23/2018   Cholangitis 09/23/2018   Sepsis (HCC) 09/23/2018    PCP: Nita Sells, MD REFERRING PROVIDER: Fuller Canada, MD  ONSET DATE: ~2 months  REFERRING DIAG: M25.511,G89.29,M25.512 (ICD-10-CM) - Chronic pain of both shoulders   THERAPY DIAG:  Left shoulder pain, unspecified chronicity  Right shoulder pain, unspecified chronicity  Shoulder stiffness, right  Stiffness of left shoulder, not elsewhere classified  Other symptoms and signs involving the musculoskeletal system  Rationale for Evaluation and Treatment: Rehabilitation  SUBJECTIVE:   SUBJECTIVE STATEMENT: "I can put my clothes on easier now" Pt accompanied by: self  PERTINENT HISTORY: Pt having pain in BUE since December 2024. She initially injured her LUE after a fall, however her RUE is now increasing in pain and limitations now.   PRECAUTIONS: None  WEIGHT BEARING RESTRICTIONS: No  PAIN:  Are you having pain? Yes: NPRS scale: 4/10 Pain location: Anterior shoulder girdle Pain description: aching Aggravating factors: movement Relieving factors: rest   FALLS: Has patient fallen in last 6 months? Yes. Number of falls 1  PLOF: Independent  PATIENT GOALS: "I want to be able to use my arm"  NEXT MD VISIT: None  OBJECTIVE:   HAND DOMINANCE: Right  ADLs: Overall ADLs: Pt is unable to dress without assist, unable to wash or style her hair. Pt reports she  can't even lift a cup of water to be able to drink with her L hand. Daughter assists with all IADL's at this time due to weakness and pain.   FUNCTIONAL OUTCOME MEASURES: Upper Extremity Functional Scale (UEFS): 50/80 - 62.5% 01/09/24: 40/80: 50%  UPPER EXTREMITY ROM:       Assessed in seated, er/IR adducted  Active ROM Right eval Left eval Right 01/09/24 Left 01/09/24  Shoulder flexion 107 81 139 135  Shoulder abduction 87 64 161 123  Shoulder internal rotation 90 90  90 90  Shoulder external rotation 31 11 51 73  (Blank rows = not tested)    UPPER EXTREMITY MMT:     Assessed in seated, er/IR adducted  MMT Right eval Left eval Right 01/09/24 Left 01/09/24  Shoulder flexion 4+/5 4/5 4+/5 4+/5  Shoulder abduction 4-/5 4-/5 4/5 4/5  Shoulder internal rotation 4/5 3+/5 4+/5 4+/5  Shoulder external rotation 4-/5 3/5 4+/5 4+/5  (Blank rows = not tested)  HAND FUNCTION: Grip strength: Right: 44 lbs; Left: 23 lbs  SENSATION: WFL  EDEMA: No swelling noted  OBSERVATIONS: Pt reports intermittent shaking even with  minimal effort   TODAY'S TREATMENT:                                                                                                                              DATE:   01/09/24 -A/ROM: BUE, seated, flexion, abduction, protraction, horizontal abduction, er/IR, x15 -X to V arms, BUE, x15 -Goal Post arms, BUE, x15 -measurements for reassessments -Reviewed HEP -Reviewed ADL's that pt had struggled with due to pain and ROM  01/06/24 -A/ROM: BUE, seated, flexion, abduction, protraction, horizontal abduction, er/IR, x15 -X to V arms, BUE, x15 -Goal Post arms, BUE, x15 -Shoulder Strengthening: BUE, 2#, seated, flexion, abduction, protraction, horizontal abduction, er/IR, x10 -Proximal Shoulder Exercises: paddles, criss cross, circles both directions, x12 each -Theraball Exercises: flexion, overhead press, protraction, V ups, circles both directions, x10 -UBE: level 2, 2.5' forwards and backwards  01/02/24 -Pulleys: flexion, abduction, x60" each -AA/ROM: BUE, seated, flexion, abduction, protraction, horizontal abduction, er/IR, x12 -A/ROM: BUE, seated, flexion, abduction, protraction, horizontal abduction, er/IR, x10 -Functional Reaching: BUE, 1# in flexion 1st and 2nd shelf x10 each, 1# in abduction, 1st and 2nd shelf x10 each, 2# in flexion 1st and 2nd shelf x10 each -ABC's in the air, green weighted ball, BUE -Scapular Strengthening: red  band, extension, retraction, rows, x12 -UBE: level 2, 2.5' forwards and backwards    PATIENT EDUCATION: Education details: Shoulder strengthening, w/ dumbbells Person educated: Patient Education method: Explanation, Demonstration, and Handouts Education comprehension: verbalized understanding and returned demonstration  HOME EXERCISE PROGRAM: 2/4: Table Slides and Pendulums 2/7: AA/ROM 2/10: A/ROM 2/19: Scapular Strengthening 2/27: functional reaching in cabinets at home 3/3: Shoulder strengthening, w/ dumbbells  GOALS: Goals reviewed with patient? Yes   SHORT TERM GOALS: Target date: 12/27/23  Pt will be provided with and educated on HEP to improve mobility in  BUE required for use during ADL completion.   Goal status: MET  2.  Pt will increase BUE P/ROM by 30 degrees to improve ability to use BUE during dressing tasks with minimal compensatory techniques.   Goal status: MET  3.  Pt will increase BUE strength to 4/5 to improve ability to reach for items at waist to chest height during bathing and grooming tasks.   Goal status: MET  LONG TERM GOALS: Target date: 01/17/24  Pt will decrease pain in BUE to 3/10 or less to improve ability to sleep for 2+ consecutive hours without waking due to pain.   Goal status: MET  2.  Pt will decrease BUE fascial restrictions to min amounts or less to improve mobility required for functional reaching tasks.   Goal status: MET  3.  Pt will increase BUE A/ROM by 40 degrees to improve ability to use BUE when reaching overhead or behind back during dressing and bathing tasks.   Goal status: MET  4.  Pt will increase BUE strength to 4+/5 or greater to improve ability to use BUE when lifting or carrying items during meal preparation/housework/yardwork tasks.   Goal status: NOT MET/PARTIALLY MET  5.  Pt will return to highest level of function using BUE as dominant/non-dominant during functional task completion.   Goal status:  MET   ASSESSMENT:  CLINICAL IMPRESSION: Pt completed reassessment this session, where she demonstrated improving ROM, strength, functional mobility, and decreased pain/fascial restrictions. She feels that she is able to manage at home now, with HEP and completing her ADL's with no compensatory strategies. Pt has no further skilled OT needs at this time and will be discharged from outpatient therapy.   PERFORMANCE DEFICITS: in functional skills including in functional skills including ADLs, IADLs, coordination, tone, ROM, strength, pain, fascial restrictions, muscle spasms, and UE functional use.   PLAN:  OT FREQUENCY: 2x/week  OT DURATION: 4 weeks  PLANNED INTERVENTIONS: 97168 OT Re-evaluation, 97535 self care/ADL training, 57846 therapeutic exercise, 97530 therapeutic activity, 97112 neuromuscular re-education, 97140 manual therapy, 97035 ultrasound, 97010 moist heat, 97032 electrical stimulation (manual), passive range of motion, functional mobility training, energy conservation, coping strategies training, patient/family education, and DME and/or AE instructions  RECOMMENDED OTHER SERVICES: N/A  CONSULTED AND AGREED WITH PLAN OF CARE: Patient  PLAN FOR NEXT SESSION: Manual Therapy PRN, AA/ROM, A/ROM, Scapular strengthening, shoulder strengthening   Trish Mage, OTR/L [facility] [phone] Quinterious Walraven Rosemarie Beath, OT 01/10/2024, 7:39 AM

## 2024-02-10 DIAGNOSIS — M545 Low back pain, unspecified: Secondary | ICD-10-CM | POA: Diagnosis not present

## 2024-02-10 DIAGNOSIS — E538 Deficiency of other specified B group vitamins: Secondary | ICD-10-CM | POA: Diagnosis not present

## 2024-02-10 DIAGNOSIS — F331 Major depressive disorder, recurrent, moderate: Secondary | ICD-10-CM | POA: Diagnosis not present

## 2024-02-10 DIAGNOSIS — G8929 Other chronic pain: Secondary | ICD-10-CM | POA: Diagnosis not present

## 2024-03-09 DIAGNOSIS — E538 Deficiency of other specified B group vitamins: Secondary | ICD-10-CM | POA: Diagnosis not present

## 2024-03-09 DIAGNOSIS — Z79899 Other long term (current) drug therapy: Secondary | ICD-10-CM | POA: Diagnosis not present

## 2024-03-09 DIAGNOSIS — F331 Major depressive disorder, recurrent, moderate: Secondary | ICD-10-CM | POA: Diagnosis not present

## 2024-03-09 DIAGNOSIS — Z86711 Personal history of pulmonary embolism: Secondary | ICD-10-CM | POA: Diagnosis not present

## 2024-03-19 DIAGNOSIS — E1165 Type 2 diabetes mellitus with hyperglycemia: Secondary | ICD-10-CM | POA: Diagnosis not present

## 2024-03-19 DIAGNOSIS — I10 Essential (primary) hypertension: Secondary | ICD-10-CM | POA: Diagnosis not present

## 2024-03-27 ENCOUNTER — Ambulatory Visit
Admission: EM | Admit: 2024-03-27 | Discharge: 2024-03-27 | Disposition: A | Discharge status: Home / Self Care | Attending: Nurse Practitioner | Admitting: Nurse Practitioner

## 2024-03-27 ENCOUNTER — Ambulatory Visit (INDEPENDENT_AMBULATORY_CARE_PROVIDER_SITE_OTHER)

## 2024-03-27 DIAGNOSIS — K219 Gastro-esophageal reflux disease without esophagitis: Secondary | ICD-10-CM | POA: Diagnosis not present

## 2024-03-27 DIAGNOSIS — S2231XA Fracture of one rib, right side, initial encounter for closed fracture: Secondary | ICD-10-CM | POA: Diagnosis not present

## 2024-03-27 DIAGNOSIS — R0600 Dyspnea, unspecified: Secondary | ICD-10-CM | POA: Diagnosis not present

## 2024-03-27 DIAGNOSIS — R944 Abnormal results of kidney function studies: Secondary | ICD-10-CM | POA: Diagnosis not present

## 2024-03-27 DIAGNOSIS — E875 Hyperkalemia: Secondary | ICD-10-CM | POA: Diagnosis not present

## 2024-03-27 DIAGNOSIS — R0781 Pleurodynia: Secondary | ICD-10-CM

## 2024-03-27 DIAGNOSIS — I517 Cardiomegaly: Secondary | ICD-10-CM | POA: Diagnosis not present

## 2024-03-27 DIAGNOSIS — I110 Hypertensive heart disease with heart failure: Secondary | ICD-10-CM | POA: Diagnosis not present

## 2024-03-27 DIAGNOSIS — E1165 Type 2 diabetes mellitus with hyperglycemia: Secondary | ICD-10-CM | POA: Diagnosis not present

## 2024-03-27 DIAGNOSIS — R7401 Elevation of levels of liver transaminase levels: Secondary | ICD-10-CM | POA: Diagnosis not present

## 2024-03-27 DIAGNOSIS — F17210 Nicotine dependence, cigarettes, uncomplicated: Secondary | ICD-10-CM | POA: Diagnosis not present

## 2024-03-27 DIAGNOSIS — F331 Major depressive disorder, recurrent, moderate: Secondary | ICD-10-CM | POA: Diagnosis not present

## 2024-03-27 DIAGNOSIS — E785 Hyperlipidemia, unspecified: Secondary | ICD-10-CM | POA: Diagnosis not present

## 2024-03-27 DIAGNOSIS — S299XXA Unspecified injury of thorax, initial encounter: Secondary | ICD-10-CM | POA: Diagnosis not present

## 2024-03-27 MED ORDER — TRAMADOL HCL 50 MG PO TABS: 50.0000 mg | ORAL_TABLET | Freq: Two times a day (BID) | ORAL | 0 refills | Status: AC | PRN

## 2024-03-27 NOTE — ED Provider Notes (Signed)
 [facility]    CSN: [id number] Arrival date & time: 03/27/24  1044      History   Chief Complaint Chief Complaint  Patient presents with   Back Pain    HPI Tamara Santiago is a 73 y.o. female.   The history is provided by the patient and a relative (Daughter).   Patient presents for complaints of pain in the right side of her rib cage after a fall approximately 2 days ago.  Patient states that she fell onto something and onto her right side.  She states that she has pain with moving, coughing, and deep breathing.  Patient denies difficulty breathing, shortness of breath, bruising, or swelling to the site.  She states that the area of the right rib cage is tender to palpation.  Past Medical History:  Diagnosis Date   Anxiety    Essential hypertension    GERD (gastroesophageal reflux disease)    Hyperlipidemia    Insomnia    MDD (major depressive disorder)    Nicotine  dependence    Snoring     Patient Active Problem List   Diagnosis Date Noted   Pain in joint of left shoulder 10/08/2023   Acute pulmonary embolism (HCC) 05/03/2023   Hypoalbuminemia due to protein-calorie malnutrition (HCC) 05/03/2023   Essential hypertension 05/03/2023   Mixed hyperlipidemia 05/03/2023   GERD (gastroesophageal reflux disease) 05/03/2023   Asthma, chronic 05/03/2023   CHF (congestive heart failure) (HCC) 05/02/2023   Acute cholecystitis 09/23/2018   Hypokalemia 09/23/2018   Cholangitis 09/23/2018   Sepsis (HCC) 09/23/2018    Past Surgical History:  Procedure Laterality Date   CHOLECYSTECTOMY N/A 09/24/2018   Procedure: LAPAROSCOPIC CHOLECYSTECTOMY;  Surgeon: Alanda Allegra, MD;  Location: [facility];  Service: General;  Laterality: N/A;   TUBAL LIGATION      OB History   No obstetric history on file.      Home Medications    Prior to Admission medications   Medication Sig Start Date End Date Taking? Authorizing Provider  traMADol  (ULTRAM ) 50 MG tablet Take 1  tablet (50 mg total) by mouth every 12 (twelve) hours as needed. 03/27/24  Yes Leath-Warren, Belen Bowers, NP  acetaminophen  (TYLENOL ) 500 MG tablet Take 500-1,000 mg by mouth every 6 (six) hours as needed for moderate pain.    [provider]  albuterol  (VENTOLIN  HFA) 108 (90 Base) MCG/ACT inhaler Inhale 2 puffs into the lungs every 4 (four) hours as needed for wheezing or shortness of breath. 05/06/23   Colin Dawley, MD  apixaban  (ELIQUIS ) 5 MG TABS tablet Take 1 tablet (5 mg total) by mouth 2 (two) times daily. Please start this dose in about 28 days or so after you complete the initial dose pack 06/03/23   Colin Dawley, MD  cyanocobalamin  (VITAMIN B12) 1000 MCG/ML injection Inject 1 mL (1,000 mcg total) into the skin every 30 (thirty) days. 05/06/23   Colin Dawley, MD  escitalopram (LEXAPRO) 20 MG tablet Take 20 mg by mouth at bedtime. 04/15/23   [provider]  furosemide  (LASIX ) 40 MG tablet Take 40 mg daily. May take an extra 40 mg as needed for weight gain over 3 lbs in 24 hours 09/25/23   Gerard Knight, MD  imipramine (TOFRANIL) 25 MG tablet Take 25 mg by mouth daily.    [provider]  JARDIANCE  10 MG TABS tablet TAKE 1 TABLET BY MOUTH DAILY BEFORE BREAKFAST 12/02/23   Gerard Knight, MD  loratadine (CLARITIN) 10 MG tablet  Take 10 mg by mouth daily.    [provider]  losartan  (COZAAR ) 100 MG tablet Take 1 tablet (100 mg total) by mouth daily. 05/06/23   Colin Dawley, MD  metoprolol  tartrate (LOPRESSOR ) 50 MG tablet Take 50 mg by mouth 2 (two) times daily.    [provider]  omeprazole (PRILOSEC) 40 MG capsule Take 40 mg by mouth daily.    [provider]  potassium chloride  (KLOR-CON ) 10 MEQ tablet Take 2 tablets (20 mEq total) by mouth daily. 10/09/23 01/07/24  Gerard Knight, MD  rosuvastatin  (CRESTOR ) 10 MG tablet Take 1 tablet (10 mg total) by mouth daily. 05/06/23   Colin Dawley, MD  spironolactone  (ALDACTONE ) 25  MG tablet Take 0.5 tablets (12.5 mg total) by mouth daily. 09/25/23 12/24/23  Gerard Knight, MD  Vitamin D, Cholecalciferol, 1000 units TABS Take 1,000 Units by mouth daily.    [provider]    Family History Family History  Problem Relation Age of Onset   Diabetes Mellitus II Mother    Heart disease Mother    Hyperlipidemia Mother    Depression Mother    Diabetes Mother    Hypertension Mother    Hypercholesterolemia Mother     Social History Social History   Tobacco Use   Smoking status: Every Day    Current packs/day: 0.25    Types: Cigarettes   Smokeless tobacco: Never  Vaping Use   Vaping status: Never Used  Substance Use Topics   Alcohol use: No   Drug use: No     Allergies   Ambien [zolpidem]   Review of Systems Review of Systems Per HPI  Physical Exam Triage Vital Signs ED Triage Vitals  Encounter Vitals Group     BP 03/27/24 1120 129/70     Systolic BP Percentile --      Diastolic BP Percentile --      Pulse Rate 03/27/24 1120 66     Resp 03/27/24 1120 16     Temp 03/27/24 1120 97.7 F (36.5 C)     Temp Source 03/27/24 1120 Oral     SpO2 03/27/24 1120 94 %     Weight --      Height --      Head Circumference --      Peak Flow --      Pain Score 03/27/24 1121 10     Pain Loc --      Pain Education --      Exclude from Growth Chart --    No data found.  Updated Vital Signs BP 129/70 (BP Location: Right Arm)   Pulse 66   Temp 97.7 F (36.5 C) (Oral)   Resp 16   SpO2 94%   Visual Acuity Right Eye Distance:   Left Eye Distance:   Bilateral Distance:    Right Eye Near:   Left Eye Near:    Bilateral Near:     Physical Exam Vitals and nursing note reviewed.  Constitutional:      General: She is not in acute distress.    Appearance: Normal appearance.  HENT:     Head: Normocephalic.  Eyes:     Extraocular Movements: Extraocular movements intact.     Conjunctiva/sclera: Conjunctivae normal.     Pupils: Pupils are  equal, round, and reactive to light.  Cardiovascular:     Rate and Rhythm: Normal rate and regular rhythm.     Pulses: Normal pulses.     Heart sounds:  Normal heart sounds.  Pulmonary:     Effort: Pulmonary effort is normal. No respiratory distress.     Breath sounds: Normal breath sounds. No stridor. No wheezing, rhonchi or rales.  Chest:     Chest wall: Tenderness present. No mass, lacerations, deformity, swelling or edema.    Abdominal:     General: Bowel sounds are normal.     Palpations: Abdomen is soft.  Musculoskeletal:     Cervical back: Normal range of motion.  Lymphadenopathy:     Cervical: No cervical adenopathy.  Skin:    General: Skin is warm and dry.  Neurological:     General: No focal deficit present.     Mental Status: She is alert and oriented to person, place, and time.  Psychiatric:        Mood and Affect: Mood normal.        Behavior: Behavior normal.      UC Treatments / Results  Labs (all labs ordered are listed, but only abnormal results are displayed) Labs Reviewed - No data to display  EKG   Radiology DG Ribs Unilateral W/Chest Right Result Date: 03/27/2024 CLINICAL DATA:  Fall 2 days back.  Right rib cage pain. EXAM: RIGHT RIBS AND CHEST - 3+ VIEW COMPARISON:  None Available. FINDINGS: Bilateral lung fields are clear. Bilateral costophrenic angles are clear. Normal cardio-mediastinal silhouette. There is subtle displaced fracture of the anteromedial right seventh rib, of indeterminate age. Correlate clinically. No other acute osseous injury. The soft tissues are within normal limits. IMPRESSION: 1. There is subtle displaced fracture of the anteromedial right seventh rib, of indeterminate age. Correlate with point tenderness. 2. No acute cardiopulmonary abnormality. Electronically Signed   By: Beula Brunswick M.D.   On: 03/27/2024 12:06    Procedures Procedures (including critical care time)  Medications Ordered in UC Medications - No data to  display  Initial Impression / Assessment and Plan / UC Course  I have reviewed the triage vital signs and the nursing notes.  Pertinent labs & imaging results that were available during my care of the patient were reviewed by me and considered in my medical decision making (see chart for details).  X-ray of the right chest shows a fracture and rib #7.  This correlates with patient's exam.  Tramadol 50 mg prescribed for more severe pain.  Supportive care recommendations were provided and discussed with patient and daughter to include the use of ice or heat, using a pillow for splinting, and taking pain medication as prescribed.  Patient was given strict ER follow-up precautions.  Patient and daughter were advised regarding the course of healing for symptoms.  Patient and daughter were in agreement with this plan of care and verbalized understanding.  All questions were answered.  Patient stable for discharge.  Final Clinical Impressions(s) / UC Diagnoses   Final diagnoses:  Rib pain on right side  Closed fracture of one rib of right side, initial encounter     Discharge Instructions      The x-ray shows you have a fracture of one of your ribs in the right rib cage. Take medication as prescribed.  You may take over-the-counter Tylenol  as needed for less severe pain. Apply warm compresses to the affected area to help with pain or discomfort.  You can apply ice to the affected area to help with pain or swelling.  Apply for 20 minutes, remove for 1 hour, repeat as needed. Recommend using a pillow for splinting to help with  pain with coughing and deep breathing.  Be sure you are coughing and deep breathing to prevent the development of pneumonia. As discussed, symptoms may persist for several weeks.  If symptoms suddenly worsen to include difficulty breathing, worsening pain, or other concerns, please go to the emergency department immediately. Follow-up as needed.   ED Prescriptions      Medication Sig Dispense Auth. Provider   traMADol (ULTRAM) 50 MG tablet Take 1 tablet (50 mg total) by mouth every 12 (twelve) hours as needed. 10 tablet Leath-Warren, Belen Bowers, NP      I have reviewed the PDMP during this encounter.   Hardy Lia, NP 03/27/24 1222

## 2024-03-27 NOTE — Discharge Instructions (Addendum)
 The x-ray shows you have a fracture of one of your ribs in the right rib cage. Take medication as prescribed.  You may take over-the-counter Tylenol  as needed for less severe pain. Apply warm compresses to the affected area to help with pain or discomfort.  You can apply ice to the affected area to help with pain or swelling.  Apply for 20 minutes, remove for 1 hour, repeat as needed. Recommend using a pillow for splinting to help with pain with coughing and deep breathing.  Be sure you are coughing and deep breathing to prevent the development of pneumonia. As discussed, symptoms may persist for several weeks.  If symptoms suddenly worsen to include difficulty breathing, worsening pain, or other concerns, please go to the emergency department immediately. Follow-up as needed.

## 2024-03-27 NOTE — ED Triage Notes (Signed)
 Pt states she tripped and fell 2 days ago. C/o right lower back pain.  States she has been taking Tylenol  at home for the pain with no relief.  Pt ambulates with a  slow steady gait.

## 2024-03-31 ENCOUNTER — Other Ambulatory Visit (HOSPITAL_COMMUNITY): Payer: Self-pay | Admitting: Nurse Practitioner

## 2024-03-31 DIAGNOSIS — S299XXA Unspecified injury of thorax, initial encounter: Secondary | ICD-10-CM

## 2024-04-09 DIAGNOSIS — S20211A Contusion of right front wall of thorax, initial encounter: Secondary | ICD-10-CM | POA: Diagnosis not present

## 2024-04-09 DIAGNOSIS — S39011A Strain of muscle, fascia and tendon of abdomen, initial encounter: Secondary | ICD-10-CM | POA: Diagnosis not present

## 2024-04-09 DIAGNOSIS — S299XXA Unspecified injury of thorax, initial encounter: Secondary | ICD-10-CM | POA: Diagnosis not present

## 2024-04-09 DIAGNOSIS — W07XXXA Fall from chair, initial encounter: Secondary | ICD-10-CM | POA: Diagnosis not present

## 2024-04-10 ENCOUNTER — Ambulatory Visit: Payer: Medicare HMO | Admitting: Cardiology

## 2024-05-21 ENCOUNTER — Telehealth: Payer: Self-pay | Admitting: Cardiology

## 2024-05-21 MED ORDER — FUROSEMIDE 40 MG PO TABS: ORAL_TABLET | ORAL | 1 refills | Status: DC

## 2024-05-21 NOTE — Telephone Encounter (Signed)
*  STAT* If patient is at the pharmacy, call can be transferred to refill team.   1. Which medications need to be refilled? (please list name of each medication and dose if known) furosemide  (LASIX ) 40 MG tablet   2. Which pharmacy/location (including street and city if local pharmacy) is medication to be sent to? [facility] - [address] [place] - [address]    3. Do they need a 30 day or 90 day supply? 90

## 2024-05-21 NOTE — Telephone Encounter (Signed)
 Pt's medication was sent to pt's pharmacy as requested. Confirmation received.

## 2024-06-24 DIAGNOSIS — E1165 Type 2 diabetes mellitus with hyperglycemia: Secondary | ICD-10-CM | POA: Diagnosis not present

## 2024-06-24 DIAGNOSIS — E538 Deficiency of other specified B group vitamins: Secondary | ICD-10-CM | POA: Diagnosis not present

## 2024-06-24 DIAGNOSIS — I10 Essential (primary) hypertension: Secondary | ICD-10-CM | POA: Diagnosis not present

## 2024-06-24 DIAGNOSIS — E539 Vitamin B deficiency, unspecified: Secondary | ICD-10-CM | POA: Diagnosis not present

## 2024-06-29 DIAGNOSIS — I119 Hypertensive heart disease without heart failure: Secondary | ICD-10-CM | POA: Diagnosis not present

## 2024-06-29 DIAGNOSIS — S299XXD Unspecified injury of thorax, subsequent encounter: Secondary | ICD-10-CM | POA: Diagnosis not present

## 2024-06-29 DIAGNOSIS — I517 Cardiomegaly: Secondary | ICD-10-CM | POA: Diagnosis not present

## 2024-06-29 DIAGNOSIS — E785 Hyperlipidemia, unspecified: Secondary | ICD-10-CM | POA: Diagnosis not present

## 2024-06-29 DIAGNOSIS — R944 Abnormal results of kidney function studies: Secondary | ICD-10-CM | POA: Diagnosis not present

## 2024-06-29 DIAGNOSIS — F331 Major depressive disorder, recurrent, moderate: Secondary | ICD-10-CM | POA: Diagnosis not present

## 2024-06-29 DIAGNOSIS — S299XXA Unspecified injury of thorax, initial encounter: Secondary | ICD-10-CM | POA: Diagnosis not present

## 2024-06-29 DIAGNOSIS — F5105 Insomnia due to other mental disorder: Secondary | ICD-10-CM | POA: Diagnosis not present

## 2024-06-29 DIAGNOSIS — E875 Hyperkalemia: Secondary | ICD-10-CM | POA: Diagnosis not present

## 2024-06-29 DIAGNOSIS — R0600 Dyspnea, unspecified: Secondary | ICD-10-CM | POA: Diagnosis not present

## 2024-06-29 DIAGNOSIS — E1165 Type 2 diabetes mellitus with hyperglycemia: Secondary | ICD-10-CM | POA: Diagnosis not present

## 2024-06-29 DIAGNOSIS — K219 Gastro-esophageal reflux disease without esophagitis: Secondary | ICD-10-CM | POA: Diagnosis not present

## 2024-07-13 ENCOUNTER — Ambulatory Visit: Attending: Cardiology | Admitting: Cardiology

## 2024-07-13 ENCOUNTER — Encounter: Payer: Self-pay | Admitting: Cardiology

## 2024-07-13 VITALS — BP 140/76 | HR 62 | Ht 64.0 in | Wt 167.0 lb

## 2024-07-13 DIAGNOSIS — E782 Mixed hyperlipidemia: Secondary | ICD-10-CM

## 2024-07-13 DIAGNOSIS — I5032 Chronic diastolic (congestive) heart failure: Secondary | ICD-10-CM | POA: Diagnosis not present

## 2024-07-13 DIAGNOSIS — I10 Essential (primary) hypertension: Secondary | ICD-10-CM | POA: Diagnosis not present

## 2024-07-13 MED ORDER — FUROSEMIDE 20 MG PO TABS: 20.0000 mg | ORAL_TABLET | ORAL | 3 refills | Status: AC

## 2024-07-13 MED ORDER — POTASSIUM CHLORIDE ER 10 MEQ PO TBCR: 20.0000 meq | EXTENDED_RELEASE_TABLET | ORAL | 3 refills | Status: AC

## 2024-07-13 NOTE — Patient Instructions (Signed)
 Medication Instructions:   DECREASE Lasix  to 20 mg every other day  DECREASE Potassium to 20 meq every other day     Labwork: None today  Testing/Procedures: None today  Follow-Up: 6 months  Any Other Special Instructions Will Be Listed Below (If Applicable).  If you need a refill on your cardiac medications before your next appointment, please call your pharmacy.

## 2024-07-13 NOTE — Progress Notes (Signed)
    Cardiology Office Note  Date: 07/13/2024   ID: Eda Magnussen, DOB Apr 16, 1951, MRN [id number]  History of Present Illness: Tamara Santiago is a 73 y.o. female last seen in November 2024.  She is here today with her daughter for a follow-up visit.  She does not report any worsening dyspnea on exertion of her baseline, weight has been coming down, no progressive fluid retention evident.  She has noticed loose stools on the days that she takes Lasix  40 mg, cut this back on her own to every other day.  Medications reviewed.  We discussed reducing Lasix  to 20 mg every other day with potassium supplement.  Otherwise she is on stable cardiac regimen.  She did complete course of Eliquis  for treatment of pulmonary embolus.  I reviewed her lab work which is outlined below.  I reviewed her ECG today which shows sinus bradycardia with right bundle branch block.  Physical Exam: VS:  BP (!) 140/76 (BP Location: Right Arm, Patient Position: Sitting, Cuff Size: Normal)   Pulse 62   Ht 5' 4 (1.626 m)   Wt 167 lb (75.8 kg)   SpO2 94%   BMI 28.67 kg/m , BMI Body mass index is 28.67 kg/m.  Wt Readings from Last 3 Encounters:  07/13/24 167 lb (75.8 kg)  12/02/23 173 lb (78.5 kg)  10/21/23 173 lb (78.5 kg)    General: Patient appears comfortable at rest. HEENT: Conjunctiva and lids normal. Neck: Supple, no elevated JVP or carotid bruits. Lungs: Clear to auscultation, nonlabored breathing at rest. Cardiac: Regular rate and rhythm, no S3 or significant systolic murmur. Extremities: No pitting edema.  ECG:  An ECG dated 02/27/2021 was personally reviewed today and demonstrated:  Sinus rhythm with right bundle branch block.  Labwork: 10/09/2023: BUN 13; Creatinine, Ser 1.01; Potassium 3.3; Sodium 136  August 2025: Hemoglobin 15, platelet 306, BUN 7, creatinine 1, GFR 59, potassium 4.2, AST 16, ALT 12, cholesterol 104, glycerides 91, HDL 36, LDL 50, hemoglobin A1c 6.1%  Other Studies Reviewed  Today:  No interval cardiac testing for review today.  Assessment and Plan:  1.  HFpEF, LVEF approximately 55% by echocardiogram in June 2024, moderate diastolic dysfunction at that point but normal RV function.  As discussed above, plan to reduce Lasix  20 mg every other day with potassium supplement, may ultimately be able to go to as needed.  She is on Aldactone  12.5 mg daily, losartan  100 mg daily, and Jardiance  10 mg daily otherwise.   2.  Primary hypertension.  Continue with current regimen which also includes Lopressor  50 mg twice daily.  Keep follow-up with PCP.   3.  Mixed hyperlipidemia.  LDL 50 in August.  Continue Crestor  10 mg daily.   4.  Small acute segmental and subsegmental pulmonary emboli in the left upper lobe by chest CTA in June 2024.  She completed course of Eliquis .  Disposition:  Follow up 6 months.  Signed, Jayson JUDITHANN Sierras, M.D., F.A.C.C. [facility]

## 2024-08-07 ENCOUNTER — Other Ambulatory Visit (HOSPITAL_COMMUNITY): Payer: Self-pay | Admitting: Nurse Practitioner

## 2024-08-07 DIAGNOSIS — Z13820 Encounter for screening for osteoporosis: Secondary | ICD-10-CM

## 2024-08-10 DIAGNOSIS — F331 Major depressive disorder, recurrent, moderate: Secondary | ICD-10-CM | POA: Diagnosis not present

## 2024-08-10 DIAGNOSIS — R0600 Dyspnea, unspecified: Secondary | ICD-10-CM | POA: Diagnosis not present

## 2024-08-10 DIAGNOSIS — Z23 Encounter for immunization: Secondary | ICD-10-CM | POA: Diagnosis not present

## 2024-08-10 DIAGNOSIS — Z0000 Encounter for general adult medical examination without abnormal findings: Secondary | ICD-10-CM | POA: Diagnosis not present

## 2024-08-10 DIAGNOSIS — R944 Abnormal results of kidney function studies: Secondary | ICD-10-CM | POA: Diagnosis not present

## 2024-08-10 DIAGNOSIS — G4700 Insomnia, unspecified: Secondary | ICD-10-CM | POA: Diagnosis not present

## 2024-08-10 DIAGNOSIS — I10 Essential (primary) hypertension: Secondary | ICD-10-CM | POA: Diagnosis not present

## 2024-08-10 DIAGNOSIS — R197 Diarrhea, unspecified: Secondary | ICD-10-CM | POA: Diagnosis not present

## 2024-08-10 DIAGNOSIS — I517 Cardiomegaly: Secondary | ICD-10-CM | POA: Diagnosis not present

## 2024-08-21 ENCOUNTER — Ambulatory Visit (HOSPITAL_COMMUNITY)
Admission: RE | Admit: 2024-08-21 | Discharge: 2024-08-21 | Disposition: A | Discharge status: Home / Self Care | Source: Ambulatory Visit | Attending: Nurse Practitioner | Admitting: Nurse Practitioner

## 2024-08-21 DIAGNOSIS — Z13820 Encounter for screening for osteoporosis: Secondary | ICD-10-CM | POA: Diagnosis present

## 2024-08-21 DIAGNOSIS — Z780 Asymptomatic menopausal state: Secondary | ICD-10-CM | POA: Diagnosis not present

## 2024-08-21 DIAGNOSIS — M8589 Other specified disorders of bone density and structure, multiple sites: Secondary | ICD-10-CM | POA: Diagnosis not present

## 2024-09-11 ENCOUNTER — Other Ambulatory Visit: Payer: Self-pay | Admitting: Cardiology

## 2024-09-30 DIAGNOSIS — I10 Essential (primary) hypertension: Secondary | ICD-10-CM | POA: Diagnosis not present

## 2024-09-30 DIAGNOSIS — E1165 Type 2 diabetes mellitus with hyperglycemia: Secondary | ICD-10-CM | POA: Diagnosis not present

## 2024-10-05 DIAGNOSIS — I119 Hypertensive heart disease without heart failure: Secondary | ICD-10-CM | POA: Diagnosis not present

## 2024-10-05 DIAGNOSIS — R944 Abnormal results of kidney function studies: Secondary | ICD-10-CM | POA: Diagnosis not present

## 2024-10-05 DIAGNOSIS — F5105 Insomnia due to other mental disorder: Secondary | ICD-10-CM | POA: Diagnosis not present

## 2024-10-05 DIAGNOSIS — F331 Major depressive disorder, recurrent, moderate: Secondary | ICD-10-CM | POA: Diagnosis not present

## 2024-10-05 DIAGNOSIS — K219 Gastro-esophageal reflux disease without esophagitis: Secondary | ICD-10-CM | POA: Diagnosis not present

## 2024-10-05 DIAGNOSIS — E875 Hyperkalemia: Secondary | ICD-10-CM | POA: Diagnosis not present

## 2024-10-05 DIAGNOSIS — I517 Cardiomegaly: Secondary | ICD-10-CM | POA: Diagnosis not present

## 2024-10-05 DIAGNOSIS — R0600 Dyspnea, unspecified: Secondary | ICD-10-CM | POA: Diagnosis not present

## 2024-10-05 DIAGNOSIS — R7401 Elevation of levels of liver transaminase levels: Secondary | ICD-10-CM | POA: Diagnosis not present

## 2024-10-05 DIAGNOSIS — I10 Essential (primary) hypertension: Secondary | ICD-10-CM | POA: Diagnosis not present

## 2024-10-05 DIAGNOSIS — E785 Hyperlipidemia, unspecified: Secondary | ICD-10-CM | POA: Diagnosis not present

## 2024-10-05 DIAGNOSIS — E1165 Type 2 diabetes mellitus with hyperglycemia: Secondary | ICD-10-CM | POA: Diagnosis not present

## 2024-10-16 ENCOUNTER — Telehealth: Payer: Self-pay | Admitting: Cardiology

## 2024-10-16 NOTE — Telephone Encounter (Signed)
 Pt c/o medication issue:  1. Name of Medication:   potassium chloride  (KLOR-CON  10) 10 MEQ tablet    2. How are you currently taking this medication (dosage and times per day)?  Take 2 tablets (20 mEq total) by mouth every other day.       3. Are you having a reaction (difficulty breathing--STAT)? No  4. What is your medication issue? Pt's daughter is requesting a callback regarding this medication causing pt to have diarrhea she stated. Please advise

## 2024-10-16 NOTE — Telephone Encounter (Signed)
 I spoke with daughter and she says her mother had not been taking potassium recently because she gets diarrhea. She was taking 20 meq every other day.She had recent potassium done by Dr.Hall's office and it was 4.7 on 10/02/24 at [facility]. Daughter then tells me she gets diarrhea every time she eats. I have asked her to speak with Dr.Hall regarding this.I spoke with pharmacist at [facility] and there is a capsule formulation vs the tablet she is taking now however the are not sure if the capsule version is better tolerated.

## 2024-12-10 ENCOUNTER — Other Ambulatory Visit: Payer: Self-pay | Admitting: Cardiology
